# Patient Record
Sex: Female | Born: 1960 | Race: White | Hispanic: No | Marital: Married | Smoking: Former smoker
Health system: Southern US, Community
[De-identification: ages and names within clinical notes are randomized; demographics above are authoritative.]

## PROBLEM LIST (undated history)

## (undated) DIAGNOSIS — E119 Type 2 diabetes mellitus without complications: Secondary | ICD-10-CM

## (undated) DIAGNOSIS — R4702 Dysphasia: Secondary | ICD-10-CM

## (undated) DIAGNOSIS — G43909 Migraine, unspecified, not intractable, without status migrainosus: Secondary | ICD-10-CM

## (undated) DIAGNOSIS — Z8614 Personal history of Methicillin resistant Staphylococcus aureus infection: Secondary | ICD-10-CM

## (undated) DIAGNOSIS — F319 Bipolar disorder, unspecified: Secondary | ICD-10-CM

## (undated) DIAGNOSIS — E876 Hypokalemia: Secondary | ICD-10-CM

## (undated) DIAGNOSIS — IMO0001 Reserved for inherently not codable concepts without codable children: Secondary | ICD-10-CM

## (undated) DIAGNOSIS — F329 Major depressive disorder, single episode, unspecified: Secondary | ICD-10-CM

## (undated) DIAGNOSIS — E785 Hyperlipidemia, unspecified: Secondary | ICD-10-CM

## (undated) DIAGNOSIS — I214 Non-ST elevation (NSTEMI) myocardial infarction: Secondary | ICD-10-CM

## (undated) HISTORY — DX: Non-ST elevation (NSTEMI) myocardial infarction: I21.4

## (undated) HISTORY — DX: Reserved for inherently not codable concepts without codable children: IMO0001

## (undated) HISTORY — DX: Dysphasia: R47.02

## (undated) HISTORY — DX: Personal history of Methicillin resistant Staphylococcus aureus infection: Z86.14

## (undated) HISTORY — DX: Hyperlipidemia, unspecified: E78.5

## (undated) HISTORY — DX: Hypokalemia: E87.6

## (undated) HISTORY — DX: Migraine, unspecified, not intractable, without status migrainosus: G43.909

## (undated) HISTORY — DX: Type 2 diabetes mellitus without complications: E11.9

---

## 1994-06-25 HISTORY — PX: KNEE ARTHROSCOPY: SUR90

## 2000-06-19 ENCOUNTER — Encounter: Payer: Self-pay | Admitting: Dermatology

## 2000-06-19 ENCOUNTER — Ambulatory Visit (HOSPITAL_COMMUNITY): Admission: RE | Admit: 2000-06-19 | Discharge: 2000-06-19 | Payer: Self-pay | Admitting: Dermatology

## 2000-06-19 ENCOUNTER — Encounter (INDEPENDENT_AMBULATORY_CARE_PROVIDER_SITE_OTHER): Payer: Self-pay | Admitting: Specialist

## 2001-05-29 ENCOUNTER — Ambulatory Visit (HOSPITAL_COMMUNITY): Admission: RE | Admit: 2001-05-29 | Discharge: 2001-05-29 | Payer: Self-pay | Admitting: Family Medicine

## 2001-05-29 ENCOUNTER — Encounter: Payer: Self-pay | Admitting: Family Medicine

## 2001-07-03 ENCOUNTER — Other Ambulatory Visit: Admission: RE | Admit: 2001-07-03 | Discharge: 2001-07-03 | Payer: Self-pay | Admitting: Family Medicine

## 2001-11-14 ENCOUNTER — Encounter: Admission: RE | Admit: 2001-11-14 | Discharge: 2001-11-14 | Payer: Self-pay | Admitting: Psychiatry

## 2001-11-18 ENCOUNTER — Encounter: Admission: RE | Admit: 2001-11-18 | Discharge: 2001-11-18 | Payer: Self-pay | Admitting: Psychiatry

## 2001-12-01 ENCOUNTER — Encounter: Admission: RE | Admit: 2001-12-01 | Discharge: 2001-12-01 | Payer: Self-pay | Admitting: Psychiatry

## 2001-12-18 ENCOUNTER — Encounter: Admission: RE | Admit: 2001-12-18 | Discharge: 2001-12-18 | Payer: Self-pay | Admitting: Psychiatry

## 2001-12-22 ENCOUNTER — Encounter: Admission: RE | Admit: 2001-12-22 | Discharge: 2001-12-22 | Payer: Self-pay | Admitting: Psychiatry

## 2002-01-06 ENCOUNTER — Encounter: Admission: RE | Admit: 2002-01-06 | Discharge: 2002-01-06 | Payer: Self-pay | Admitting: Psychiatry

## 2002-01-15 ENCOUNTER — Encounter: Admission: RE | Admit: 2002-01-15 | Discharge: 2002-01-15 | Payer: Self-pay | Admitting: Psychiatry

## 2002-02-03 ENCOUNTER — Encounter: Admission: RE | Admit: 2002-02-03 | Discharge: 2002-02-03 | Payer: Self-pay | Admitting: Psychiatry

## 2002-02-24 ENCOUNTER — Encounter: Admission: RE | Admit: 2002-02-24 | Discharge: 2002-02-24 | Payer: Self-pay | Admitting: Psychiatry

## 2002-04-06 ENCOUNTER — Encounter: Admission: RE | Admit: 2002-04-06 | Discharge: 2002-04-06 | Payer: Self-pay | Admitting: Psychiatry

## 2002-04-14 ENCOUNTER — Encounter: Admission: RE | Admit: 2002-04-14 | Discharge: 2002-04-14 | Payer: Self-pay | Admitting: Psychiatry

## 2002-05-18 ENCOUNTER — Encounter: Admission: RE | Admit: 2002-05-18 | Discharge: 2002-05-18 | Payer: Self-pay | Admitting: Psychiatry

## 2002-06-22 ENCOUNTER — Encounter: Admission: RE | Admit: 2002-06-22 | Discharge: 2002-06-22 | Payer: Self-pay | Admitting: Psychiatry

## 2002-07-27 ENCOUNTER — Encounter: Admission: RE | Admit: 2002-07-27 | Discharge: 2002-07-27 | Payer: Self-pay | Admitting: Professional Counselor

## 2002-08-10 ENCOUNTER — Encounter: Admission: RE | Admit: 2002-08-10 | Discharge: 2002-08-10 | Payer: Self-pay | Admitting: *Deleted

## 2002-09-03 ENCOUNTER — Encounter: Payer: Self-pay | Admitting: Family Medicine

## 2002-09-03 ENCOUNTER — Ambulatory Visit (HOSPITAL_COMMUNITY): Admission: RE | Admit: 2002-09-03 | Discharge: 2002-09-03 | Payer: Self-pay | Admitting: Family Medicine

## 2002-10-05 ENCOUNTER — Encounter: Admission: RE | Admit: 2002-10-05 | Discharge: 2002-10-05 | Payer: Self-pay | Admitting: *Deleted

## 2002-10-07 ENCOUNTER — Encounter: Admission: RE | Admit: 2002-10-07 | Discharge: 2002-10-07 | Payer: Self-pay | Admitting: *Deleted

## 2003-01-27 ENCOUNTER — Encounter: Admission: RE | Admit: 2003-01-27 | Discharge: 2003-01-27 | Payer: Self-pay | Admitting: *Deleted

## 2003-02-11 ENCOUNTER — Other Ambulatory Visit: Admission: RE | Admit: 2003-02-11 | Discharge: 2003-02-11 | Payer: Self-pay | Admitting: Family Medicine

## 2003-02-26 ENCOUNTER — Encounter: Admission: RE | Admit: 2003-02-26 | Discharge: 2003-02-26 | Payer: Self-pay | Admitting: Family Medicine

## 2003-02-26 ENCOUNTER — Encounter: Payer: Self-pay | Admitting: Family Medicine

## 2003-04-13 ENCOUNTER — Emergency Department (HOSPITAL_COMMUNITY): Admission: EM | Admit: 2003-04-13 | Discharge: 2003-04-13 | Payer: Self-pay | Admitting: Emergency Medicine

## 2003-12-31 ENCOUNTER — Encounter: Admission: RE | Admit: 2003-12-31 | Discharge: 2003-12-31 | Payer: Self-pay | Admitting: Psychiatry

## 2004-03-03 ENCOUNTER — Ambulatory Visit (HOSPITAL_COMMUNITY): Payer: Self-pay | Admitting: Psychiatry

## 2004-04-03 ENCOUNTER — Ambulatory Visit (HOSPITAL_COMMUNITY): Admission: RE | Admit: 2004-04-03 | Discharge: 2004-04-03 | Payer: Self-pay | Admitting: Gastroenterology

## 2004-06-25 HISTORY — PX: ESOPHAGEAL DILATION: SHX303

## 2004-07-17 ENCOUNTER — Ambulatory Visit (HOSPITAL_COMMUNITY): Payer: Self-pay | Admitting: Psychiatry

## 2004-09-07 ENCOUNTER — Other Ambulatory Visit: Admission: RE | Admit: 2004-09-07 | Discharge: 2004-09-07 | Payer: Self-pay | Admitting: Family Medicine

## 2004-09-19 ENCOUNTER — Encounter: Admission: RE | Admit: 2004-09-19 | Discharge: 2004-09-19 | Payer: Self-pay | Admitting: Family Medicine

## 2004-10-09 ENCOUNTER — Ambulatory Visit (HOSPITAL_COMMUNITY): Payer: Self-pay | Admitting: Psychiatry

## 2005-01-08 ENCOUNTER — Ambulatory Visit (HOSPITAL_COMMUNITY): Payer: Self-pay | Admitting: Psychiatry

## 2005-04-11 ENCOUNTER — Ambulatory Visit (HOSPITAL_COMMUNITY): Payer: Self-pay | Admitting: Psychiatry

## 2005-05-01 ENCOUNTER — Encounter: Admission: RE | Admit: 2005-05-01 | Discharge: 2005-05-01 | Payer: Self-pay | Admitting: Family Medicine

## 2005-07-09 ENCOUNTER — Ambulatory Visit (HOSPITAL_COMMUNITY): Payer: Self-pay | Admitting: Psychiatry

## 2006-09-18 ENCOUNTER — Ambulatory Visit (HOSPITAL_COMMUNITY): Payer: Self-pay | Admitting: Psychiatry

## 2006-12-30 ENCOUNTER — Emergency Department (HOSPITAL_COMMUNITY): Admission: EM | Admit: 2006-12-30 | Discharge: 2006-12-30 | Payer: Self-pay | Admitting: Emergency Medicine

## 2007-01-22 ENCOUNTER — Ambulatory Visit (HOSPITAL_COMMUNITY): Admission: RE | Admit: 2007-01-22 | Discharge: 2007-01-22 | Payer: Self-pay | Admitting: Obstetrics and Gynecology

## 2007-01-22 ENCOUNTER — Encounter (INDEPENDENT_AMBULATORY_CARE_PROVIDER_SITE_OTHER): Payer: Self-pay | Admitting: Obstetrics and Gynecology

## 2007-02-05 ENCOUNTER — Inpatient Hospital Stay (HOSPITAL_COMMUNITY): Admission: AD | Admit: 2007-02-05 | Discharge: 2007-02-05 | Payer: Self-pay | Admitting: Obstetrics and Gynecology

## 2007-04-01 ENCOUNTER — Ambulatory Visit (HOSPITAL_COMMUNITY): Payer: Self-pay | Admitting: Psychiatry

## 2007-04-21 ENCOUNTER — Encounter: Admission: RE | Admit: 2007-04-21 | Discharge: 2007-04-21 | Payer: Self-pay | Admitting: Neurosurgery

## 2007-06-26 HISTORY — PX: ENDOMETRIAL ABLATION: SHX621

## 2008-05-26 ENCOUNTER — Ambulatory Visit (HOSPITAL_COMMUNITY): Payer: Self-pay | Admitting: Psychiatry

## 2008-12-08 ENCOUNTER — Ambulatory Visit (HOSPITAL_COMMUNITY): Payer: Self-pay | Admitting: Psychiatry

## 2010-01-01 ENCOUNTER — Ambulatory Visit: Payer: Self-pay | Admitting: Cardiology

## 2010-01-01 ENCOUNTER — Encounter: Payer: Self-pay | Admitting: Emergency Medicine

## 2010-01-02 ENCOUNTER — Encounter (INDEPENDENT_AMBULATORY_CARE_PROVIDER_SITE_OTHER): Payer: Self-pay | Admitting: *Deleted

## 2010-01-02 ENCOUNTER — Encounter (INDEPENDENT_AMBULATORY_CARE_PROVIDER_SITE_OTHER): Payer: Self-pay | Admitting: Internal Medicine

## 2010-01-02 ENCOUNTER — Ambulatory Visit: Payer: Self-pay | Admitting: Gastroenterology

## 2010-01-03 ENCOUNTER — Ambulatory Visit: Payer: Self-pay | Admitting: Cardiology

## 2010-01-03 ENCOUNTER — Inpatient Hospital Stay (HOSPITAL_COMMUNITY): Admission: EM | Admit: 2010-01-03 | Discharge: 2010-01-04 | Payer: Self-pay | Admitting: Cardiovascular Disease

## 2010-01-03 HISTORY — PX: CARDIAC CATHETERIZATION: SHX172

## 2010-01-18 ENCOUNTER — Ambulatory Visit: Payer: Self-pay | Admitting: Cardiology

## 2010-01-18 ENCOUNTER — Encounter: Payer: Self-pay | Admitting: Adult Health

## 2010-01-18 DIAGNOSIS — E1165 Type 2 diabetes mellitus with hyperglycemia: Secondary | ICD-10-CM

## 2010-01-18 DIAGNOSIS — I2510 Atherosclerotic heart disease of native coronary artery without angina pectoris: Secondary | ICD-10-CM

## 2010-01-18 DIAGNOSIS — R030 Elevated blood-pressure reading, without diagnosis of hypertension: Secondary | ICD-10-CM

## 2010-01-18 DIAGNOSIS — E1129 Type 2 diabetes mellitus with other diabetic kidney complication: Secondary | ICD-10-CM

## 2010-01-18 DIAGNOSIS — K029 Dental caries, unspecified: Secondary | ICD-10-CM | POA: Insufficient documentation

## 2010-02-09 ENCOUNTER — Encounter: Payer: Self-pay | Admitting: Gastroenterology

## 2010-03-08 ENCOUNTER — Ambulatory Visit (HOSPITAL_COMMUNITY): Payer: Self-pay | Admitting: Psychiatry

## 2010-04-28 ENCOUNTER — Ambulatory Visit (HOSPITAL_COMMUNITY): Payer: Self-pay | Admitting: Psychiatry

## 2010-05-08 ENCOUNTER — Encounter (INDEPENDENT_AMBULATORY_CARE_PROVIDER_SITE_OTHER): Payer: Self-pay | Admitting: *Deleted

## 2010-05-09 ENCOUNTER — Ambulatory Visit: Payer: Self-pay | Admitting: Cardiology

## 2010-05-16 ENCOUNTER — Encounter: Payer: Self-pay | Admitting: Adult Health

## 2010-05-16 ENCOUNTER — Ambulatory Visit: Payer: Self-pay | Admitting: Cardiology

## 2010-05-27 ENCOUNTER — Encounter: Payer: Self-pay | Admitting: Adult Health

## 2010-05-27 LAB — CONVERTED CEMR LAB
BUN: 12 mg/dL (ref 6–23)
CO2: 26 meq/L (ref 19–32)
Chloride: 105 meq/L (ref 96–112)
Potassium: 3.9 meq/L (ref 3.5–5.3)
Sodium: 142 meq/L (ref 135–145)

## 2010-07-25 NOTE — Letter (Signed)
Summary: [facility] [employer] at [employer]  [address] [place] [address]   Phone: [phone]  Fax: [phone]     May 16, 2010 MRN: [id number]   DELVA DERDEN [address] [place], [place]  [address]      YOUR LAB WORK IS DUE  May 30, 2010 _________________________________________  Please go to [facility], located across the street from [facility] on the second floor.  Hours are Monday - Friday 7am until 7:30pm         Saturday 8am until 12noon    __  DO NOT EAT OR DRINK AFTER MIDNIGHT EVENING PRIOR TO LABWORK  _X_ YOUR LABWORK IS NOT FASTING --YOU MAY EAT PRIOR TO LABWORK

## 2010-07-25 NOTE — Miscellaneous (Signed)
Summary: LABS TSH,A1C,01/02/2010  Clinical Lists Changes  Observations: Added new observation of TSH: 0.848 microintl units/mL (01/02/2010 15:09) Added new observation of HGBA1C: 11.7 % (01/02/2010 15:09)

## 2010-07-25 NOTE — Assessment & Plan Note (Signed)
Summary: F3M   Visit Type:  Follow-up Primary Provider:  [facility]   History of Present Illness: Allison [name] is a 50 y/o CF who we are seeing on follow-up after admission to [facility] and subsequent transfer to [facility] in the setting of NSTEMI.  She had subsequent cardiac catherization with PCI to a long lesion in the mid RCA using overlapping Promus DES with improvement of narrowing from 100%-0%.   Other history includes NIDDM, GERD, Ongoing tobacco abuse. She presents today without cardiac complaints but is suffering from NV and Diarrhea for 2 days. She denies chest pain, DOE, palpatations, or dizziness.    Current Medications (verified): 1)  Aspir-Trin 325 Mg Tbec (Aspirin) .... Take 1 Tab Daily 2)  Plavix 75 Mg Tabs (Clopidogrel Bisulfate) .... Take 1 Tab Daily 3)  Nitrostat 0.4 Mg Subl (Nitroglycerin) .... Use As Directed For Chest Pain Not To Exceed 3 Tabs 4)  Amlodipine Besylate 10 Mg Tabs (Amlodipine Besylate) .... Take 1 Tablet By Mouth Once Daily 5)  Lisinopril-Hydrochlorothiazide 20-12.5 Mg Tabs (Lisinopril-Hydrochlorothiazide) .... Take 1 Tab Daily 6)  Bupropion Hcl 150 Mg Xr24h-Tab (Bupropion Hcl) .... Take 1 Tab Daily 7)  Cinnamon 500 Mg Caps (Cinnamon) .... Take 1 Tab Two Times A Day 8)  Fish Oil 1000 Mg Caps (Omega-3 Fatty Acids) .... Take 1 Cap Daily 9)  Januvia 100 Mg Tabs (Sitagliptin Phosphate) .... Take 1 Tab Daily 10)  Nexium 40 Mg Pack (Esomeprazole Magnesium) .... Take 1 Tab Daily 11)  Paxil Cr 37.5 Mg Xr24h-Tab (Paroxetine Hcl) .... Take 1 Tab Daily 12)  Red Yeast Rice 600 Mg Tabs (Red Yeast Rice Extract) .... Take 1 Tab Daily 13)  Trilipix 135 Mg Cpdr (Choline Fenofibrate) .... Take 1 Tab Daily 14)  Tylenol Extra Strength 500 Mg Tabs (Acetaminophen) .... Use As Needed For Pain 15)  Lantus 100 Unit/ml Soln (Insulin Glargine) .... 55 Units Daily 16)  Norco 5-325 Mg Tabs (Hydrocodone-Acetaminophen) .... Take As Needed 17)  Apidra 100 Unit/ml Soln  (Insulin Glulisine) .... Sliding Scale  Allergies (verified): 1)  ! Sulfa 2)  ! Penicillin 3)  ! * Rosuvastatn 4)  ! * Latex 5)  ! Asa  Comments:  Nurse/Medical Assistant: patient reviewed med list from previous ov and stated the only changes is  apidra ss and paxil 37.5 daily took off of glipizide   Past History:  Past medical, surgical, family and social histories (including risk factors) reviewed, and no changes noted (except as noted below).  Past Medical History: Reviewed history from 01/16/2010 and no changes required. non-st elevated myocardial infarction dysphasia hypotension hypokalemia non-insulin dependant diabetes hyperlipidemia migrain headaches hemorroids history of MRSA  Past Surgical History: Reviewed history from 01/16/2010 and no changes required. arthroscopic right knee surgery in 1996 laser endometrial ablation 2009 esophageal dilation 2006 cardiac cath 01/03/2010  Family History: Reviewed history and no changes required.  Social History: Reviewed history and no changes required.  Review of Systems       Nausea, vomiting and diarrhea  All other systems have been reviewed and are negative unless stated above.   Vital Signs:  Patient profile:   50 year old female Weight:      205 pounds O2 Sat:      99 % on Room air Pulse rate:   86 / minute BP sitting:   176 / 81  (right arm)  Vitals Entered By: Dreama Saa, CNA (May 09, 2010 11:12 AM)  O2 Flow:  Room air  Physical Exam  General:  Well developed, well nourished, in no acute distress.normal appearance.  normal appearance.   Mouth:  poor dentition.  poor dentition.   Lungs:  Clear bilaterally to auscultation and percussion. Heart:  Non-displaced PMI, chest non-tender; regular rate and rhythm, S1, S2 without murmurs, rubs or gallops. Carotid upstroke normal, no bruit. Normal abdominal aortic size, no bruits. Femorals normal pulses, no bruits. Pedals normal pulses. No edema, no  varicosities. Abdomen:  RUQ tenderness and LUQ tenderness.  Normal bowel soundsRUQ tenderness and LUQ tenderness.   Msk:  Back normal, normal gait. Muscle strength and tone normal. Pulses:  pulses normal in all 4 extremities Extremities:  No clubbing or cyanosis. Neurologic:  Alert and oriented x 3. Skin:  macular rash:Marland Kitchen   Psych:  Normal affect.   Impression & Recommendations:  Problem # 1:  HYPERTENSION, WHITE COAT (ICD-796.2) Blood pressure is even more elevated on this visit than on prior visits.  Will increase norvasc to 10mg  daily.  She will follow up in one week for BP check.  She is advised to be on a low salt diet.  Problem # 2:  CORONARY ATHEROSCLEROSIS, NATIVE VESSEL (ICD-414.01) Appears stable from a CV standpoint.  No chest pain or shortness of breath.  Continue current medications. Her updated medication list for this problem includes:    Aspir-trin 325 Mg Tbec (Aspirin) .Marland Kitchen... Take 1 tab daily    Plavix 75 Mg Tabs (Clopidogrel bisulfate) .Marland Kitchen... Take 1 tab daily    Nitrostat 0.4 Mg Subl (Nitroglycerin) ..... Use as directed for chest pain not to exceed 3 tabs    Amlodipine Besylate 10 Mg Tabs (Amlodipine besylate) .Marland Kitchen... Take 1 tablet by mouth once daily    Lisinopril-hydrochlorothiazide 20-12.5 Mg Tabs (Lisinopril-hydrochlorothiazide) .Marland Kitchen... Take 1 tab daily  Patient Instructions: 1)  Your physician recommends that you schedule a follow-up appointment in: 6 months 2)  Your physician has recommended you make the following change in your medication: Increase Amlodipine to 10mg  by mouth daily. Prescriptions: AMLODIPINE BESYLATE 10 MG TABS (AMLODIPINE BESYLATE) take 1 tablet by mouth once daily  #30 x 6   Entered by:   Larita Fife Via LPN   Authorized by:   Joni Reining, NP   Signed by:   Larita Fife Via LPN on 56/21/3086   Method used:   Electronically to        [facility] [phone]* (retail)       [address]       [place], [place]   [address]       Ph: [phone] or [phone]       Fax: [phone]   RxID:   [id number]

## 2010-07-25 NOTE — Letter (Signed)
Summary: CONSULTATION  CONSULTATION   Imported By: Rexene Alberts 02/09/2010 11:32:25  _____________________________________________________________________  External Attachment:    Type:   Image     Comment:   External Document

## 2010-07-25 NOTE — Assessment & Plan Note (Signed)
Summary: post hosp [facility] per Matt/tg   Visit Type:  Follow-up Primary Provider:  [facility]  CC:  no cardiology complaints.  History of Present Illness: Mrs. Allison [name] is a 50 y/o CF who we are seeing on follow-up after admission to [facility] and subsequent transfer to [facility] in the setting of NSTEMI.  She had subsequent cardiac catherization with PCI to a long lesion in the mid RCA using overlapping Promus DES with improvement of narrowing from 100%-0%.   Other history includes NIDDM, GERD, Ongoing tobacco abuse.  She is here for post discharge evaluation.  Since discharge, she has been seen by Dr.David Mandalinich DDS and is planned to have tooth extraction X2.  She has a right mandibular idontogenic infection and has been placed on Biaxin for treatment.  A note from Dr. Deeann Dowse accompanies the patient with his to request advisement of any further prophylatic treatment with newly placed stents.  He is aware that she is on Plavix.  She has had one episode of chest pain requiring one dose of NTG.  This occured with vacuming and heavy cleaning of her house.  No recurrence of pain.  She walks 15 minutes each day in the morning and evening.  Cannot afford cardiac rehab because of high deductible from state insurance.  Current Medications (verified): 1)  Aspir-Trin 325 Mg Tbec (Aspirin) .... Take 1 Tab Daily 2)  Plavix 75 Mg Tabs (Clopidogrel Bisulfate) .... Take 1 Tab Daily 3)  Nicotine 21 Mg/24hr Pt24 (Nicotine) 4)  Nitrostat 0.4 Mg Subl (Nitroglycerin) .... Use As Directed For Chest Pain Not To Exceed 3 Tabs 5)  Amlodipine Besylate 5 Mg Tabs (Amlodipine Besylate) .... Take 1tab Daily 6)  Lisinopril-Hydrochlorothiazide 20-12.5 Mg Tabs (Lisinopril-Hydrochlorothiazide) .... Take 1 Tab Daily 7)  Bupropion Hcl 150 Mg Xr24h-Tab (Bupropion Hcl) .... Take 1 Tab Daily 8)  Cinnamon 500 Mg Caps (Cinnamon) .... Take 1 Tab Two Times A Day 9)  Fish Oil 1000 Mg Caps (Omega-3 Fatty Acids)  .... Take 1 Cap Daily 10)  Glimepiride 4 Mg Tabs (Glimepiride) .... Take 1 Tab Daily 11)  Januvia 100 Mg Tabs (Sitagliptin Phosphate) .... Take 1 Tab Daily 12)  Nexium 40 Mg Pack (Esomeprazole Magnesium) .... Take 1 Tab Daily 13)  Paroxetine Hcl 25 Mg Xr24h-Tab (Paroxetine Hcl) .... Take 1 Tab Daily 14)  Red Yeast Rice 600 Mg Tabs (Red Yeast Rice Extract) .... Take 1 Tab Daily 15)  Trilipix 135 Mg Cpdr (Choline Fenofibrate) .... Take 1 Tab Daily 16)  Tylenol Extra Strength 500 Mg Tabs (Acetaminophen) .... Use As Needed For Pain 17)  Lantus 100 Unit/ml Soln (Insulin Glargine) .... 30 Units Daily 18)  Norco 5-325 Mg Tabs (Hydrocodone-Acetaminophen) .... Take As Needed 19)  Clindamycin Hcl 150 Mg Caps (Clindamycin Hcl) .... Take 1 Tab Three Times A Day  Allergies (verified): 1)  ! Sulfa 2)  ! Penicillin 3)  ! * Rosuvastatn 4)  ! * Latex 5)  ! Asa  Past History:  Past medical, surgical, family and social histories (including risk factors) reviewed, and no changes noted (except as noted below).  Past Medical History: Reviewed history from 01/16/2010 and no changes required. non-st elevated myocardial infarction dysphasia hypotension hypokalemia non-insulin dependant diabetes hyperlipidemia migrain headaches hemorroids history of MRSA  Past Surgical History: Reviewed history from 01/16/2010 and no changes required. arthroscopic right knee surgery in 1996 laser endometrial ablation 2009 esophageal dilation 2006 cardiac cath 01/03/2010  Family History: Reviewed history and no changes required.  Social  History: Reviewed history and no changes required.  Review of Systems       All other systems have been reviewed and are negative unless stated above.   Vital Signs:  Patient profile:   50 year old female Height:      63 inches Weight:      191 pounds BMI:     33.96 Pulse rate:   86 / minute BP sitting:   160 / 87  (right arm)  Vitals Entered By: Dreama Saa, CNA  (January 18, 2010 12:04 PM)  Physical Exam  General:  Well developed, well nourished, in no acute distress. Neck:  Neck supple, no JVD. No masses, thyromegaly or abnormal cervical nodes. Lungs:  Clear bilaterally to auscultation and percussion. Heart:  Non-displaced PMI, chest non-tender; regular rate and rhythm, S1, S2 without murmurs, rubs or gallops. Carotid upstroke normal, no bruit. Normal abdominal aortic size, no bruits. Femorals normal pulses, no bruits. Pedals normal pulses. No edema, no varicosities. Abdomen:  Bowel sounds positive; abdomen soft and non-tender without masses, organomegaly, or hernias noted. No hepatosplenomegaly. Msk:  Back normal, normal gait. Muscle strength and tone normal. Extremities:  Right wrist is well healed from cath insertion site. Neurologic:  Alert and oriented x 3. Skin:  eczematous rash: and maculopapular rash hands, knees and feet.  Psych:  hyperactive.     EKG  Procedure date:  01/18/2010  Findings:      Normal sinus rhythm with rate of:  76bpm  Impression & Recommendations:  Problem # 1:  CORONARY ATHEROSCLEROSIS, NATIVE VESSEL (ICD-414.01) She is doing well post PCI with only one incidence of chest pain occuring after vacuming and heavy house cleaning less than 3 days after discharge.  She has multiple questions concerning activities. 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  She is to continue the current medications, DAPT with no change in the doseages., Her updated medication list for this problem includes:    Aspir-trin 325 Mg Tbec (Aspirin) .Marland Kitchen... Take 1 tab daily    Plavix 75 Mg Tabs (Clopidogrel bisulfate) .Marland Kitchen... Take 1 tab daily    Nitrostat 0.4 Mg Subl (Nitroglycerin) ..... Use as directed for chest pain not to exceed 3 tabs    Amlodipine Besylate 5 Mg Tabs (Amlodipine besylate) .Marland Kitchen... Take 1tab daily     Lisinopril-hydrochlorothiazide 20-12.5 Mg Tabs (Lisinopril-hydrochlorothiazide) .Marland Kitchen... Take 1 tab daily  Problem # 2:  DENTAL CARIES (ICD-521.00) I have spoken to Dr.David Mandalinich by phone and advised him that patient can continue on prophylactic abx treatment for mandibular infection prior to tooth extraction.  He is very aware of pt being on Plavix and need to continue this post stent placement for a minimum of 6months, but perferably one year.  He advises me that he can do the extraction on Plavix without extensive bleed, with new "plugging" procedures post extraction.   Problem # 3:  HYPERTENSION, WHITE COAT (ICD-796.2) Curently the patient is hypertensive in the office.  Review of hospital records reveals evidence of hypotension.  She appears anxious today in the office, mildy hyperactive.  Will monitor the BP. If it continues to be elevated we wil make adjustments.  Patient Instructions: 1)  Your physician recommends that you schedule a follow-up appointment in: 3 months 2)  Your physician recommends that you continue on your current medications as directed. Please refer to the Current Medication list  given to you today.  Appended Document: post hosp [facility] per Matt/tg Office visit note faxed to Dr. Deeann Dowse @ [phone].

## 2010-07-25 NOTE — Assessment & Plan Note (Signed)
**Note De-Identified Allison [name] Obfuscation** Summary: NURSE VISIT BP CHECK  Nurse Visit   Vital Signs:  Patient profile:   50 year old female Weight:      211 pounds O2 Sat:      96 % on Room air Pulse rate:   78 / minute BP sitting:   144 / 89  (left arm)  Vitals Entered By: Larita Fife Margarito Dehaas LPN (May 16, 2010 8:30 AM)  O2 Flow:  Room air  Primary Provider:  [facility]   History of Present Illness: S: Pt. arrives in office for 1 week BP check/nurse visit. B: On last OV with Joni Reining, NP on 05-09-10 and was advised to increase Norvasc to 10mg  once daily and to have BP check in 1 week.  A: Pt. c/o headaches, otherwise she has no complaints at this time. BP=136/83. R: Pt. advised that we will contact her with Claudine Mouton, NP's recommendations if any.   Allergies: 1)  ! Sulfa 2)  ! Penicillin 3)  ! * Rosuvastatn 4)  ! * Latex 5)  ! Asa  Orders Added: 1)  T-Basic Metabolic Panel [id number] Prescriptions: LISINOPRIL-HYDROCHLOROTHIAZIDE 20-25 MG TABS (LISINOPRIL-HYDROCHLOROTHIAZIDE) take 1 tablet by mouth once daily  #30 x 3   Entered by:   Larita Fife Patricio Popwell LPN   Authorized by:   Joni Reining, NP   Signed by:   Larita Fife Airianna Kreischer LPN on 09/81/1914   Method used:   Electronically to        [facility] [phone]* (retail)       [address]       [place], [place]  [address]       Ph: [phone] or [phone]       Fax: [phone]   RxID:   [id number]  Blood pressure much better. Continue same meds, but increase HCTZ to 25mg  daily.  BMET in 2 weeks.  LMOM.   Larita Fife Kewanna Kasprzak LPN  May 16, 2010 11:27 AM    Per Joni Reining, NP pt. advised and states she understands instructions given. RX faxed to [facility] in [place] and lab order mailed to pt.   Larita Fife Elizabethanne Lusher LPN  May 16, 2010 12:13 PM

## 2010-08-01 ENCOUNTER — Other Ambulatory Visit: Payer: Self-pay | Admitting: Family Medicine

## 2010-08-01 DIAGNOSIS — Z1231 Encounter for screening mammogram for malignant neoplasm of breast: Secondary | ICD-10-CM

## 2010-08-24 ENCOUNTER — Ambulatory Visit
Admission: RE | Admit: 2010-08-24 | Discharge: 2010-08-24 | Disposition: A | Discharge status: Home / Self Care | Payer: BC Managed Care – PPO | Source: Ambulatory Visit | Attending: Family Medicine | Admitting: Family Medicine

## 2010-08-24 DIAGNOSIS — Z1231 Encounter for screening mammogram for malignant neoplasm of breast: Secondary | ICD-10-CM

## 2010-08-25 ENCOUNTER — Encounter (HOSPITAL_COMMUNITY): Payer: BC Managed Care – PPO | Admitting: Physician Assistant

## 2010-08-25 DIAGNOSIS — F3189 Other bipolar disorder: Secondary | ICD-10-CM

## 2010-09-10 LAB — URINALYSIS, ROUTINE W REFLEX MICROSCOPIC
Bilirubin Urine: NEGATIVE
Glucose, UA: 500 mg/dL — AB
Hgb urine dipstick: NEGATIVE
Protein, ur: NEGATIVE mg/dL

## 2010-09-10 LAB — CBC
HCT: 38.6 % (ref 36.0–46.0)
HCT: 41.6 % (ref 36.0–46.0)
Hemoglobin: 13.1 g/dL (ref 12.0–15.0)
Hemoglobin: 13 g/dL (ref 12.0–15.0)
MCH: 31.2 pg (ref 26.0–34.0)
MCH: 31 pg (ref 26.0–34.0)
MCH: 31.2 pg (ref 26.0–34.0)
MCHC: 34.1 g/dL (ref 30.0–36.0)
MCHC: 34.2 g/dL (ref 30.0–36.0)
MCV: 92.1 fL (ref 78.0–100.0)
MCV: 90.7 fL (ref 78.0–100.0)
MCV: 91.5 fL (ref 78.0–100.0)
MCV: 91.6 fL (ref 78.0–100.0)
Platelets: 272 10*3/uL (ref 150–400)
Platelets: 333 10*3/uL (ref 150–400)
Platelets: 375 10*3/uL (ref 150–400)
RBC: 4.2 MIL/uL (ref 3.87–5.11)
RBC: 4.17 MIL/uL (ref 3.87–5.11)
RBC: 4.55 MIL/uL (ref 3.87–5.11)
RBC: 4.9 MIL/uL (ref 3.87–5.11)
WBC: 10.8 10*3/uL — ABNORMAL HIGH (ref 4.0–10.5)
WBC: 15.5 10*3/uL — ABNORMAL HIGH (ref 4.0–10.5)

## 2010-09-10 LAB — GLUCOSE, CAPILLARY
Glucose-Capillary: 161 mg/dL — ABNORMAL HIGH (ref 70–99)
Glucose-Capillary: 236 mg/dL — ABNORMAL HIGH (ref 70–99)
Glucose-Capillary: 242 mg/dL — ABNORMAL HIGH (ref 70–99)
Glucose-Capillary: 224 mg/dL — ABNORMAL HIGH (ref 70–99)
Glucose-Capillary: 292 mg/dL — ABNORMAL HIGH (ref 70–99)

## 2010-09-10 LAB — BASIC METABOLIC PANEL
BUN: 9 mg/dL (ref 6–23)
CO2: 25 mEq/L (ref 19–32)
CO2: 24 mEq/L (ref 19–32)
Calcium: 8.6 mg/dL (ref 8.4–10.5)
Chloride: 108 mEq/L (ref 96–112)
Chloride: 101 mEq/L (ref 96–112)
GFR calc Af Amer: >60 mL/min (ref >60)
GFR calc Af Amer: >60 mL/min (ref >60)
GFR calc non Af Amer: >60 mL/min (ref >60)
Glucose, Bld: 231 mg/dL — ABNORMAL HIGH (ref 70–99)
Potassium: 3.9 mEq/L (ref 3.5–5.1)
Potassium: 3.2 mEq/L — ABNORMAL LOW (ref 3.5–5.1)
Sodium: 139 mEq/L (ref 135–145)
Sodium: 135 mEq/L (ref 135–145)

## 2010-09-10 LAB — POCT CARDIAC MARKERS
CKMB, poc: <1 ng/mL — ABNORMAL LOW (ref 1.0–8.0)
Myoglobin, poc: 59.8 ng/mL (ref 12–200)

## 2010-09-10 LAB — DIFFERENTIAL
Basophils Absolute: 0.1 10*3/uL (ref 0.0–0.1)
Basophils Relative: 1 % (ref 0–1)
Eosinophils Absolute: 0.1 10*3/uL (ref 0.0–0.7)
Eosinophils Relative: 1 % (ref 0–5)
Eosinophils Relative: 1 % (ref 0–5)
Lymphocytes Relative: 27 % (ref 12–46)
Lymphocytes Relative: 35 % (ref 12–46)
Lymphs Abs: 5.1 10*3/uL — ABNORMAL HIGH (ref 0.7–4.0)
Lymphs Abs: 5.4 10*3/uL — ABNORMAL HIGH (ref 0.7–4.0)
Monocytes Absolute: 0.4 10*3/uL (ref 0.1–1.0)
Monocytes Absolute: 0.8 10*3/uL (ref 0.1–1.0)
Monocytes Relative: 5 % (ref 3–12)
Neutro Abs: 7.3 10*3/uL (ref 1.7–7.7)
Neutrophils Relative %: 51 % (ref 43–77)

## 2010-09-10 LAB — URINE CULTURE: Culture: NO GROWTH

## 2010-09-10 LAB — PROTIME-INR
INR: 0.93 (ref 0.00–1.49)
Prothrombin Time: 12.7 seconds (ref 11.6–15.2)

## 2010-09-10 LAB — COMPREHENSIVE METABOLIC PANEL
Albumin: 3.8 g/dL (ref 3.5–5.2)
BUN: 11 mg/dL (ref 6–23)
Calcium: 9.3 mg/dL (ref 8.4–10.5)
Chloride: 103 mEq/L (ref 96–112)
Creatinine, Ser: 0.6 mg/dL (ref 0.4–1.2)
GFR calc non Af Amer: >60 mL/min (ref >60)
Total Bilirubin: 0.3 mg/dL (ref 0.3–1.2)

## 2010-09-10 LAB — HEPARIN LEVEL (UNFRACTIONATED): Heparin Unfractionated: 0.1 IU/mL — ABNORMAL LOW (ref 0.30–0.70)

## 2010-09-10 LAB — HEPARIN ANTI-XA
Heparin LMW: 0.1 IU/mL
Heparin LMW: <0.1 IU/mL

## 2010-09-10 LAB — LIPID PANEL
Cholesterol: 257 mg/dL — ABNORMAL HIGH (ref 0–200)
LDL Cholesterol: 153 mg/dL — ABNORMAL HIGH (ref 0–99)
Total CHOL/HDL Ratio: 7.1 RATIO

## 2010-09-10 LAB — CARDIAC PANEL(CRET KIN+CKTOT+MB+TROPI)
CK, MB: 34.4 ng/mL (ref 0.3–4.0)
CK, MB: 35 ng/mL (ref 0.3–4.0)
Relative Index: 5.6 — ABNORMAL HIGH (ref 0.0–2.5)
Total CK: 389 U/L — ABNORMAL HIGH (ref 7–177)
Troponin I: 3.91 ng/mL (ref 0.00–0.06)

## 2010-09-10 LAB — PLATELET INHIBITION P2Y12: Platelet Function Baseline: 300 [PRU] (ref 194–418)

## 2010-09-10 LAB — LIPASE, BLOOD: Lipase: 27 U/L (ref 11–59)

## 2010-09-10 LAB — HEMOGLOBIN A1C: Hgb A1c MFr Bld: 11.7 % — ABNORMAL HIGH (ref <5.7)

## 2010-09-28 ENCOUNTER — Other Ambulatory Visit: Payer: Self-pay | Admitting: Internal Medicine

## 2010-09-28 DIAGNOSIS — IMO0002 Reserved for concepts with insufficient information to code with codable children: Secondary | ICD-10-CM

## 2010-10-06 ENCOUNTER — Other Ambulatory Visit: Payer: BC Managed Care – PPO

## 2010-11-07 NOTE — Op Note (Signed)
Allison [name], Allison [name]              ACCOUNT NO.:  [id number]   MEDICAL RECORD NO.:  [id number]          PATIENT TYPE:  AMB   LOCATION:  SDC                           FACILITY:  [facility]   PHYSICIAN:  Miguel Aschoff, M.D.       DATE OF BIRTH:  Oct 25, 1960   DATE OF PROCEDURE:  01/22/2007  DATE OF DISCHARGE:                               OPERATIVE REPORT   PREOPERATIVE DIAGNOSIS:  Menorrhagia, pelvic pain, uterine fibroids.   POSTOPERATIVE DIAGNOSIS:  Menorrhagia, pelvic pain, uterine fibroids  with pelvic endometriosis.   PROCEDURES:  1. Diagnostic laparoscopy with laser ablation of endometriosis and      laser uterosacral nerve ablation.  2. Cervical dilatation, hysteroscopy, uterine curettage followed by      ThermaChoice endometrial ablation.   SURGEON:  Dr. Miguel Aschoff   ANESTHESIA:  General.   COMPLICATIONS:  None.   JUSTIFICATION:  The patient is a 50 year old white female with history  of very heavy vaginal bleeding. In addition she reports pelvic pain. On  examination she is noted to have diffusely enlarged uterus.  Ultrasound  reveals fibroids to be present.  Because her symptomatology, she  presents now to undergo laparoscopy and see if any etiology for pelvic  pain can be identified and then this to be followed by hysteroscopy, D&C  and endometrial ablation in effort to control heavy cycles. The risks  and benefits these procedures were discussed with the patient.   PROCEDURE:  The patient was taken to the operating room, placed supine  position.  General anesthesia was administered without difficulty.  She  is then placed in dorsal lithotomy position, prepped and draped in usual  sterile fashion.  Bladder was catheterized.  Examination under  anesthesia revealed normal external genitalia, normal Bartholin Skene's  glands, normal urethra.  The vaginal vault was without gross lesion.  The cervix was without gross lesions. The uterus was mid position,  globular, approximately 8  weeks' equivalent size.  The adnexa revealed  no masses.  This point the Hulka tenaculum was placed through the cervix  and held.  Attention was then directed to the umbilicus where a small  infraumbilical incision was made.  A Veress needle was inserted and the  abdomen was insufflated with 3 liters of CO2.  Following insufflation  laparoscope was introduced via the trocar. The laparoscope was in the  abdomen under direct visualization a 5 mm suprapubic port was  established.  Systematic inspection of pelvic organs revealed the uterus  to be globular, again diffusely enlarged consistent with myomas.  These  appeared to be intramural.  There were no subserosal myomas noted.  The  tubes were inspected.  They were normal along their course.  The round  ligaments were unremarkable.  No hernias were noted.  The anterior  bladder peritoneum was unremarkable. Visualization of the right ovary  showed to be completely within normal limits with a corpus luteum cyst  present.  The left ovary was inspected was within normal limits.  Again  the tubes bilaterally were normal.  Inspection the cul-de-sac revealed  some thickening of the uterosacral  ligaments in addition there was  characteristic implants of endometriosis noted in the cul-de-sac between  the two uterosacral ligaments. The remainder of the abdomen was  unremarkable liver surface was inspected appeared to be within normal  limits.  No other abnormalities were noted. At this point an effort to  help the patient's pelvic pain.  The YAG laser was used and with GRP6  and 15 watts of laser power, partial transection of the uterosacral  ligaments was carried out without difficulty.  Once this was done the  implants of endometriosis in the cul-de-sac were ablated without  difficulty.  At this point this portion of the procedure was completed.  The CO2 was allowed to escape. The trocar ports however were left in  place. At this point attention was  directed to the perineum.  A speculum  was replaced into the vaginal vault.  A Hulka tenaculum removed.  The  cervix was then grasped with a single-tooth tenaculum.  The cervix was  dilated using serial Pratt dilators until a #25 Pratt dilator could be  passed. The diagnostic hysteroscope was advanced through the endocervix.  No endocervical lesions were noted. On visualization of endometrial  cavity there were no significant submucous myomas noted in the  endometrial cavity.  No polyps were found. At this point, sharp vigorous  curettage of the endometrium was carried out.  This tissue was sent for  histologic study.  Once this was done, the ThermaChoice unit was  introduced and a treatment cycle at 87 degrees for 8 minutes was carried  out without difficulty.  On completion of this all instruments were  removed and then attention was directed back to the abdomen.  The  trocars were then removed.  Port sites were then closed using  subcuticular 4-0 Vicryl.  The port sites were then injected with 0.25%  Marcaine. The estimated blood loss from the procedures was approximately  20 mL.  The patient tolerated the procedure well and went to recovery in  satisfactory condition.   Plan is for the patient be discharged home.  She will continue her  medications taken prior to admission to the hospital.   DISCHARGE MEDICATIONS:  1. Darvocet N 100 one every 4 hours as needed for pain.  2. Doxycycline one twice a day times 3 days.   The patient is to return to the office in 4 weeks for follow-up  examination. She is to call for any problems such as fever, pain or  heavy bleeding.      Miguel Aschoff, M.D.  Electronically Signed     AR/MEDQ  D:  01/22/2007  T:  01/22/2007  Job:  [id number]

## 2011-02-08 ENCOUNTER — Ambulatory Visit (HOSPITAL_COMMUNITY)
Admission: RE | Admit: 2011-02-08 | Discharge: 2011-02-09 | Disposition: A | Discharge status: Home / Self Care | Payer: BC Managed Care – PPO | Source: Ambulatory Visit | Attending: Cardiovascular Disease | Admitting: Cardiovascular Disease

## 2011-02-08 DIAGNOSIS — I70219 Atherosclerosis of native arteries of extremities with intermittent claudication, unspecified extremity: Secondary | ICD-10-CM | POA: Insufficient documentation

## 2011-02-08 DIAGNOSIS — E119 Type 2 diabetes mellitus without complications: Secondary | ICD-10-CM | POA: Insufficient documentation

## 2011-02-08 DIAGNOSIS — I252 Old myocardial infarction: Secondary | ICD-10-CM | POA: Insufficient documentation

## 2011-02-08 DIAGNOSIS — M722 Plantar fascial fibromatosis: Secondary | ICD-10-CM | POA: Insufficient documentation

## 2011-02-08 DIAGNOSIS — I739 Peripheral vascular disease, unspecified: Secondary | ICD-10-CM | POA: Insufficient documentation

## 2011-02-08 DIAGNOSIS — E785 Hyperlipidemia, unspecified: Secondary | ICD-10-CM | POA: Insufficient documentation

## 2011-02-08 DIAGNOSIS — I708 Atherosclerosis of other arteries: Secondary | ICD-10-CM | POA: Insufficient documentation

## 2011-02-08 DIAGNOSIS — E663 Overweight: Secondary | ICD-10-CM | POA: Insufficient documentation

## 2011-02-08 DIAGNOSIS — I2510 Atherosclerotic heart disease of native coronary artery without angina pectoris: Secondary | ICD-10-CM | POA: Insufficient documentation

## 2011-02-08 DIAGNOSIS — I10 Essential (primary) hypertension: Secondary | ICD-10-CM | POA: Insufficient documentation

## 2011-02-08 DIAGNOSIS — Z87891 Personal history of nicotine dependence: Secondary | ICD-10-CM | POA: Insufficient documentation

## 2011-02-08 LAB — POCT ACTIVATED CLOTTING TIME: Activated Clotting Time: 221 seconds

## 2011-02-08 LAB — MRSA PCR SCREENING: MRSA by PCR: NEGATIVE

## 2011-02-08 LAB — GLUCOSE, CAPILLARY

## 2011-02-08 LAB — APTT: aPTT: 28 seconds (ref 24–37)

## 2011-02-08 LAB — PROTIME-INR
INR: 0.91 (ref 0.00–1.49)
Prothrombin Time: 12.4 seconds (ref 11.6–15.2)

## 2011-02-09 LAB — BASIC METABOLIC PANEL
CO2: 28 mEq/L (ref 19–32)
Chloride: 104 mEq/L (ref 96–112)
GFR calc Af Amer: >60 mL/min (ref >60)
Potassium: 3.5 mEq/L (ref 3.5–5.1)
Sodium: 139 mEq/L (ref 135–145)

## 2011-02-09 LAB — CBC
Platelets: 324 10*3/uL (ref 150–400)
RBC: 4.1 MIL/uL (ref 3.87–5.11)
RDW: 14.1 % (ref 11.5–15.5)
WBC: 10.5 10*3/uL (ref 4.0–10.5)

## 2011-02-09 LAB — GLUCOSE, CAPILLARY: Glucose-Capillary: 166 mg/dL — ABNORMAL HIGH (ref 70–99)

## 2011-02-09 NOTE — Procedures (Signed)
NAMEANIYLAH, AVANS NO.:  [id number]  MEDICAL RECORD NO.:  [id number]  LOCATION:  6524                         FACILITY:  [facility]  PHYSICIAN:  Nanetta Batty, M.D.   DATE OF BIRTH:  09-19-1960  DATE OF PROCEDURE: DATE OF DISCHARGE:                   PERIPHERAL VASCULAR INVASIVE PROCEDURE   Ms. Allison [name] is a 50 year old mildly overweight married Caucasian female mother of 1 child.  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  She is referred through the courtesy of Dr. Konrad Felix for peripheral vascular evaluation because of right lower extremity claudication.  Risk factors include 30-pack-year history of tobacco abuse having quit 1 year ago at the time of the myocardial infarction. She has had her RCA intervened on radially.  She has had no recurrent symptoms.  She does have diabetes, hypertension, hyperlipidemia as well as family history.  Her father died of an MI.  She does see Dr. Leeanne Deed in [facility] for plantar fasciitis on the right side.  Dopplers performed in our office on August 9 revealed a right ABI 0.67 with a high-frequency signal in the right external iliac artery, the left ABI 0.87.  She presents now for angiography, potential intervention for lifestyle-limiting claudication.  PROCEDURE DESCRIPTION:  The patient was brought to the Second Floor [facility] in the postabsorptive state.  She was premedicated with p.o. Valium, IV fentanyl.  Left groin was prepped and shaved in usual sterile fashion.  Xylocaine 1% was used for local anesthesia.  A 5-French sheath was inserted into the left femoral artery using standard Seldinger technique.  A 5-French tennis racket catheter was used for midstream and distal abdominal aortography.  A right and left lower extremity geography was performed via the side-arm sheath on the left and crossover with end-hole on the right.  Visipaque dye was used for the entirety of the case.   Retrograde aortic pressures was monitored during the entirety of the case.  ANGIOGRAPHIC RESULTS: 1. Abdominal aorta.     a.     Renal arteries - normal.     b.     Infrarenal abdominal aorta - normal. 2. Left lower extremity;     a.     Normal three-vessel runoff. 3. Right lower extremity;     a.     Long subtotal occlusion of the right external iliac artery      beginning at its origin extending just above the femoral head.     b.     Three-vessel runoff.  IMPRESSION:  Ms. [name] has high-grade subtotally occluded right external iliac artery.  We will proceed with PTA and stenting with a 7- [place] crossover sheath and a nitinol self-expanding stent.  Contralateral access was obtained with a 035 Rosen wire and 7-French destination sheath.  The patient received a total of 11,000 units of heparin with an ACT of 220.  Total of approximately 220 mL of contrast was used during the case.  She did receive aspirin and Plavix at the end of the case.  The lesion was crossed with a 035 angled Glidewire and end-hole catheter.  This was then exchanged for a Versacore wire.  The entire diseased segment was predilated with a 4 x  10 balloon and a 9 x 80 nitinol EV3 Protege self-expanding stent was then carefully positioned just above the femoral head back to the internal and external iliac bifurcation and deployed under fluoroscopic control.  Postdilated with a 6 x 4 Abbott Armada balloon throughout its entirety.  The final angiographic resulted reduction of a subtotal occlusion to 0% residual with excellent flow.  The patient did have palpable pedal pulse in the right during the case.  The crossover sheath was then withdrawn across the bifurcation, exchanged for short 7-French sheath.  A "groin shot" was obtained to determine suitability for hemostatic closure using ExoSeal which was confirmed.  The ExoSeal closure device was then deployed successfully and light pressure was held for several  minutes establishing hemostasis.  The patient left lab in stable condition.  IMPRESSION:  Successful right external iliac artery PTA and stenting using a Protege nitinol self-expanding stent for lifestyle-limiting claudication.  The patient will be hydrated overnight, treat with aspirin and Plavix and discharge in the morning.  She will get a followup lower extremity Dopplers, ABIs, and will see me back after that in followup.  Dr. Elwin Mocha was notified of these results.     Nanetta Batty, M.D.     JB/MEDQ  D:  02/08/2011  T:  02/09/2011  Job:  [id number]  cc:   [facility] Elwin Mocha, MD  Electronically Signed by Nanetta Batty M.D. on 02/09/2011 03:37:44 PM

## 2011-02-27 ENCOUNTER — Encounter (INDEPENDENT_AMBULATORY_CARE_PROVIDER_SITE_OTHER): Payer: BC Managed Care – PPO | Admitting: Physician Assistant

## 2011-02-27 DIAGNOSIS — F3189 Other bipolar disorder: Secondary | ICD-10-CM

## 2011-03-25 NOTE — Discharge Summary (Signed)
Allison [name], BLASDEL NO.:  [id number]  MEDICAL RECORD NO.:  [id number]  LOCATION:  6524                         FACILITY:  [facility]  PHYSICIAN:  Nanetta Batty, M.D.   DATE OF BIRTH:  Apr 08, 1961  DATE OF ADMISSION:  02/08/2011 DATE OF DISCHARGE:  02/09/2011                              DISCHARGE SUMMARY   DISCHARGE DIAGNOSES: 1. Peripheral vascular disease status post percutaneous transluminal     coronary angioplasty/stent with a Protege to the right external     iliac artery. 2. History of tobacco abuse, had quit 1 year ago, 30 pack-year     history. 3. Coronary artery disease, status post left heart cath January 04, 2011,     showed an occluded right dominant coronary artery with normal LV     function, status post overlapping Promus drug-eluting stents. 4. Diabetes mellitus. 5. Hypertension. 6. Hyperlipidemia.  HOSPITAL COURSE:  Allison [name] is a 50 year old morbidly obese Caucasian female with a history of coronary artery disease, tobacco abuse, diabetes mellitus, hypertension, hyperlipidemia.  She had been complaining of right lower extremity claudication for the last 6-7 months.  She presented for elective peripheral angiogram which resulted in PTA and stenting to the right external iliac artery.  This procedure well without complications.  She has seen by Dr. Allyson Sabal, who felt she is stable for discharge home with followup lower extremity arterial Dopplers and return office visit with Dr. Allyson Sabal.  DISCHARGE LABS:  WBCs 10.5, hemoglobin 12.0, hematocrit 35.9, platelets 324.  Sodium 139, potassium 3.5, chloride 104, carbon dioxide 28, BUN 14, creatinine 0.70.  Glucose 139, calcium 9.1.  STUDIES/PROCEDURES:  January 05, 2011, status post peripheral vascular angiogram with PTA and stenting to the right external iliac artery resulting in a 90% stenosis to 0% residual.  DISCHARGE MEDICATIONS: 1. Lyrica 75 mg 1 capsule by mouth twice daily. 2. Apidra 30-50  units sliding scale subcutaneously 4 times daily. 3. Aspirin 81 mg enteric-coated 1 tablet by daily. 4. Bupropion XL 150 mg 1 tablet by mouth daily. 5. Calcium carbonate with vitamin D over-the-counter 1 tablet by mouth     twice daily. 6. Cinnamon over-the-counter 1 tablet by mouth twice daily. 7. Plavix 75 mg tablet 1 tablet by mouth daily. 8. Fenugreek 610 mg 1 tablet by mouth twice daily. 9. Fish oil 1000 mg 1 tablet by mouth twice daily. 10.Hydrocodone/ APAP 10/325 mg 1 tablet by mouth twice daily. 11.Januvia 100 mg 1 tablet by mouth daily. 12.Lantus 95 units subcutaneously daily at bedtime. 13.Magnesium chloride over-the-counter 1 tablet by mouth daily. 14.Meloxicam 15 mg by mouth daily. 15.Multivitamin therapeutic over the counter 1 tablet by mouth daily. 16.Nexium 40 mg 1 tablet by mouth twice daily. 17.Nitroglycerin 0.4 mg sublingual 1 tablet under tongue every 5     minutes up to 3 doses in total for chest pain. 18.Paxil CR 37.5 mg 1 tablet by mouth daily. 19.Potassium chloride over the counter 1 tablet by mouth daily. 20.Ranexa 500 mg 1 tablet by mouth twice daily. 21.Red yeast rice 1 tablet by mouth twice daily. 22.Trilipix 135 mg 1 capsule by mouth twice daily. 23.Tribenzo 40/5/25 mg 1 tablet by mouth daily. 24.Vitamin B12 over the counter  1 tablet mouth daily.  DISPOSITION:  Allison [name] will be discharged home in stable condition.  RECOMMENDATIONS:  She increase her activity slowly.  She may shower and bathe.  No lifting greater than 5 pounds for 3 days.  No driving for 3 days.  Recommend she is to have a heart-healthy low-carbohydrate/low sugar diet.  If cath site becomes red, painful, swollen or discharge of fluid or pus, she is to call our office.  She will follow with at our office for lower extremity anteriorly Dopplers and with Dr. Allyson Sabal thereafter.  Our office will call with the appointment time.    ______________________________ Wilburt Finlay,  PA   ______________________________ Nanetta Batty, M.D.    BH/MEDQ  D:  02/09/2011  T:  02/09/2011  Job:  [id number]  cc:   Elwin Mocha, MD  Electronically Signed by Wilburt Finlay PA on 02/19/2011 01:16:42 PM Electronically Signed by Nanetta Batty M.D. on 03/25/2011 11:40:10 AM

## 2011-04-09 LAB — URINE MICROSCOPIC-ADD ON

## 2011-04-09 LAB — URINALYSIS, ROUTINE W REFLEX MICROSCOPIC
Leukocytes, UA: NEGATIVE
Nitrite: NEGATIVE
Specific Gravity, Urine: 1.025
Urobilinogen, UA: 0.2
pH: 5.5

## 2011-04-09 LAB — BASIC METABOLIC PANEL
BUN: 10
CO2: 25
Chloride: 104
Creatinine, Ser: 0.57
GFR calc Af Amer: >60
Potassium: 3.3 — ABNORMAL LOW

## 2011-04-09 LAB — POCT PREGNANCY, URINE: Preg Test, Ur: NEGATIVE

## 2011-04-09 LAB — PREGNANCY, URINE: Preg Test, Ur: NEGATIVE

## 2011-04-09 LAB — CBC
HCT: 41.2
MCHC: 33.5
MCV: 87.8
RBC: 4.69
WBC: 9.2

## 2011-04-17 ENCOUNTER — Encounter (INDEPENDENT_AMBULATORY_CARE_PROVIDER_SITE_OTHER): Payer: BC Managed Care – PPO | Admitting: Physician Assistant

## 2011-04-17 DIAGNOSIS — F3189 Other bipolar disorder: Secondary | ICD-10-CM

## 2011-06-07 ENCOUNTER — Encounter: Payer: Self-pay | Admitting: Cardiology

## 2011-06-21 ENCOUNTER — Ambulatory Visit (INDEPENDENT_AMBULATORY_CARE_PROVIDER_SITE_OTHER): Payer: BC Managed Care – PPO | Admitting: Physician Assistant

## 2011-06-21 DIAGNOSIS — F3189 Other bipolar disorder: Secondary | ICD-10-CM

## 2011-06-21 MED ORDER — BUPROPION HCL ER (XL) 150 MG PO TB24: 150.0000 mg | ORAL_TABLET | Freq: Every day | ORAL | Status: DC

## 2011-06-21 MED ORDER — PAROXETINE HCL ER 37.5 MG PO TB24: 37.5000 mg | ORAL_TABLET | ORAL | Status: DC

## 2011-06-21 NOTE — Progress Notes (Signed)
   [facility] Follow-up Outpatient Visit  Allison [name] 03-07-1961  Date: 06/21/11   Subjective: [name] reports that she is enjoying her break from school at this time. She reports that all in all she is doing well. She denies any problems since we decreased her Wellbutrin. She reports that her sleep has improved. Her mood has been stable. She denies any suicidal or homicidal ideation. She denies any auditory or visual hallucinations  There were no vitals filed for this visit.  Mental Status Examination  Appearance: Casual Alert: Yes Attention: good  Cooperative: Yes Eye Contact: Good Speech: Clear and even Psychomotor Activity: Normal Memory/Concentration: Intact Oriented: person, place, time/date and situation Mood: Euthymic Affect: Congruent Thought Processes and Associations: Linear Fund of Knowledge: Good Thought Content:  Insight: Good Judgement: Good  Diagnosis: Bipolar 2  Treatment Plan: Continue current medications and followup in approximately 4 months  Morrison Mcbryar, PA

## 2011-09-11 ENCOUNTER — Other Ambulatory Visit: Payer: Self-pay | Admitting: Family Medicine

## 2011-09-11 DIAGNOSIS — Z1231 Encounter for screening mammogram for malignant neoplasm of breast: Secondary | ICD-10-CM

## 2011-10-18 ENCOUNTER — Ambulatory Visit
Admission: RE | Admit: 2011-10-18 | Discharge: 2011-10-18 | Disposition: A | Discharge status: Home / Self Care | Payer: BC Managed Care – PPO | Source: Ambulatory Visit | Attending: Family Medicine | Admitting: Family Medicine

## 2011-10-18 DIAGNOSIS — Z1231 Encounter for screening mammogram for malignant neoplasm of breast: Secondary | ICD-10-CM

## 2011-11-29 ENCOUNTER — Ambulatory Visit (INDEPENDENT_AMBULATORY_CARE_PROVIDER_SITE_OTHER): Payer: BC Managed Care – PPO | Admitting: Physician Assistant

## 2011-11-29 DIAGNOSIS — F3189 Other bipolar disorder: Secondary | ICD-10-CM

## 2011-11-29 MED ORDER — BUPROPION HCL ER (XL) 150 MG PO TB24: 150.0000 mg | ORAL_TABLET | Freq: Every day | ORAL | Status: DC

## 2011-11-29 MED ORDER — PAROXETINE HCL ER 37.5 MG PO TB24: 37.5000 mg | ORAL_TABLET | ORAL | Status: DC

## 2011-11-29 NOTE — Progress Notes (Signed)
   [facility] Follow-up Outpatient Visit  Allison [name] 1960/06/27  Date: 11/29/2011  Subjective: Allison [name] presents today to followup on medications prescribed for bipolar 2. She continues to do well on Wellbutrin and Paxil. She reports that her sleep has been recently affected by pain in her neck from bulging discs, and she has to sleep upright in a lounge chair. She states that her mood has been "pretty good." She is looking forward to finishing this school year and having some time off over the summer. She is eating well, she denies any suicidal or homicidal ideation, she denies any auditory or visual hallucinations.  There were no vitals filed for this visit.  Mental Status Examination  Appearance: Fairly groomed and casually dressed Alert: Yes Attention: good  Cooperative: Yes Eye Contact: Good Speech: Clear and coherent Psychomotor Activity: Increased Memory/Concentration: Intact Oriented: person, place, time/date and situation Mood: Euthymic Affect: Appropriate Thought Processes and Associations: Linear Fund of Knowledge: Good Thought Content: Normal Insight: Good Judgement: Good  Diagnosis: Bipolar 2  Treatment Plan: We'll continue her Wellbutrin XL 150 mg daily and Paxil CR 37.5 mg daily. She will return for followup in 6 months.  Jayln Madeira, PA-C

## 2012-06-16 ENCOUNTER — Ambulatory Visit (INDEPENDENT_AMBULATORY_CARE_PROVIDER_SITE_OTHER): Payer: BC Managed Care – PPO | Admitting: Physician Assistant

## 2012-06-16 DIAGNOSIS — F3189 Other bipolar disorder: Secondary | ICD-10-CM

## 2012-06-16 MED ORDER — BUPROPION HCL ER (XL) 150 MG PO TB24: 150.0000 mg | ORAL_TABLET | Freq: Every day | ORAL | Status: DC

## 2012-06-16 MED ORDER — PAROXETINE HCL ER 37.5 MG PO TB24: 37.5000 mg | ORAL_TABLET | ORAL | Status: DC

## 2012-06-16 NOTE — Progress Notes (Signed)
   [facility] Follow-up Outpatient Visit  Allison [name] 1961/01/16  Date: 06/16/2012   Subjective: [name] presents today to followup on her treatment for bipolar disorder. She reports that her mood has been stable, and she is happy most of the time. She endorses good sleep and appetite. She has gotten a new bed and is able to elevate the head, which gives her relief from her neck pain. She denies any recent mood disturbance or problems of behavior. She continues to drive a school bus, and is looking forward to 2 weeks off during the holiday break. She denies any suicidal or homicidal ideation. She denies any auditory or visual hallucinations.  There were no vitals filed for this visit.  Mental Status Examination  Appearance: Disheveled Alert: Yes Attention: good  Cooperative: Yes Eye Contact: Good Speech: Clear and coherent Psychomotor Activity: Normal Memory/Concentration: Intact Oriented: person, place, time/date and situation Mood: Euthymic Affect: Congruent Thought Processes and Associations: Linear Fund of Knowledge: Good Thought Content: Normal Insight: Good Judgement: Good  Diagnosis: Bipolar disorder, NOS  Treatment Plan: We will continue her Wellbutrin XL 150 mg daily, and Paxil CR 37.5 mg daily. She will return for followup in 6 months.  Laiza Veenstra, PA-C

## 2012-12-15 ENCOUNTER — Ambulatory Visit (INDEPENDENT_AMBULATORY_CARE_PROVIDER_SITE_OTHER): Payer: BC Managed Care – PPO | Admitting: Physician Assistant

## 2012-12-15 VITALS — BP 131/81 | HR 72 | Ht 62.0 in | Wt 277.6 lb

## 2012-12-15 DIAGNOSIS — F3189 Other bipolar disorder: Secondary | ICD-10-CM

## 2012-12-15 DIAGNOSIS — F319 Bipolar disorder, unspecified: Secondary | ICD-10-CM

## 2012-12-15 MED ORDER — BUPROPION HCL ER (XL) 150 MG PO TB24: 150.0000 mg | ORAL_TABLET | Freq: Every day | ORAL | Status: DC

## 2012-12-15 MED ORDER — PAROXETINE HCL ER 37.5 MG PO TB24: 37.5000 mg | ORAL_TABLET | ORAL | Status: DC

## 2012-12-15 NOTE — Progress Notes (Signed)
   [facility] Follow-up Outpatient Visit  [name] 30-Aug-1960  Date: 12/15/2012   Subjective: Allison [name] presents today to followup on her treatment for bipolar disorder. She reports that her mood has been stable. She reports that her mother died in 2022/07/30, and she continues to grieve that loss. She denies any need for grief counseling at this time. She has been experiencing some depression that last about one to 2 days.  She also endorses an occasional bout of anxiety. She denies any panic attacks. She reports that her sleep and appetite are both good. She reports that in general her health has been good. She denies any suicidal or homicidal ideation. She denies any auditory or visual hallucinations.  Filed Vitals:   12/15/12 1352  BP: 131/81  Pulse: 72    Mental Status Examination  Appearance: Casual Alert: Yes Attention: good  Cooperative: Yes Eye Contact: Good Speech: Clear and coherent Psychomotor Activity: Normal Memory/Concentration: Intact Oriented: person, place, time/date and situation Mood: Anxious Affect: Congruent Thought Processes and Associations: Linear Fund of Knowledge: Good Thought Content: Normal Insight: Good Judgement: Good  Diagnosis: Bipolar disorder not otherwise specified  Treatment Plan: We will continue her Wellbutrin XL 150 mg daily, and Paxil CR 37.5 mg daily. She will return for followup in 6 months. She is encouraged to call between appointments if needs arise.  Ramzi Brathwaite, PA-C

## 2013-01-20 ENCOUNTER — Other Ambulatory Visit: Payer: Self-pay | Admitting: Family Medicine

## 2013-01-20 DIAGNOSIS — N644 Mastodynia: Secondary | ICD-10-CM

## 2013-02-04 ENCOUNTER — Ambulatory Visit
Admission: RE | Admit: 2013-02-04 | Discharge: 2013-02-04 | Disposition: A | Discharge status: Home / Self Care | Payer: BC Managed Care – PPO | Source: Ambulatory Visit | Attending: Family Medicine | Admitting: Family Medicine

## 2013-02-04 DIAGNOSIS — N644 Mastodynia: Secondary | ICD-10-CM

## 2013-06-16 ENCOUNTER — Ambulatory Visit (INDEPENDENT_AMBULATORY_CARE_PROVIDER_SITE_OTHER): Payer: BC Managed Care – PPO | Admitting: Psychiatry

## 2013-06-16 ENCOUNTER — Encounter (HOSPITAL_COMMUNITY): Payer: Self-pay | Admitting: Psychiatry

## 2013-06-16 ENCOUNTER — Ambulatory Visit (HOSPITAL_COMMUNITY): Payer: Self-pay | Admitting: Physician Assistant

## 2013-06-16 VITALS — BP 114/67 | HR 77 | Ht 62.0 in | Wt 279.2 lb

## 2013-06-16 DIAGNOSIS — F3189 Other bipolar disorder: Secondary | ICD-10-CM

## 2013-06-16 DIAGNOSIS — F329 Major depressive disorder, single episode, unspecified: Secondary | ICD-10-CM

## 2013-06-16 DIAGNOSIS — F29 Unspecified psychosis not due to a substance or known physiological condition: Secondary | ICD-10-CM

## 2013-06-16 MED ORDER — BUPROPION HCL ER (XL) 150 MG PO TB24: 150.0000 mg | ORAL_TABLET | Freq: Every day | ORAL | Status: DC

## 2013-06-16 MED ORDER — PAROXETINE HCL ER 37.5 MG PO TB24: 37.5000 mg | ORAL_TABLET | ORAL | Status: DC

## 2013-06-16 NOTE — Progress Notes (Signed)
[facility] [address] Progress Note  Allison [name] [id number] 52 y.o.  06/16/2013 10:57 AM  Chief Complaint:  I need my medication  History of Present Illness: Patient is a 52 year old female who is seen in this office since 2003.  She was seen by a physician assistant who left the practice.  She is getting a diagnosis of bipolar disorder however patient admitted that most of the time she is depressed irritable and isolated.  She is taking Wellbutrin and Paxil for many years and has been stable.  Her agitation and mood swings are better.  In the past she had tried Risperdal which was discontinued because patient was doing much better.  She is sleeping 6-7 hours.  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  Patient endorses history of agitation and mood swings when she was taking care of her niece.  Her niece is living with her mother.  She is compliant with Paxil and Wellbutrin.  She denies any side effects including any tremors or shakes.  She denies any paranoia or any hallucination.  She likes her job.  She lives with her husband.  She has one 81 year old daughter who lives in [place].  Patient is not drinking or using any illegal substances.  Her primary care physician is Dr. Gilman Buttner at [facility].  Patient recently had blood work and reported her blood work results are normal.    Suicidal Ideation: No Plan Formed: No Patient has means to carry out plan: No  Homicidal Ideation: No Plan Formed: No Patient has means to carry out plan: No  Review of Systems: Psychiatric: Agitation: No Hallucination: No Depressed Mood: No Insomnia: No Hypersomnia: No Altered Concentration: No Feels Worthless: No Grandiose Ideas: No Belief In Special Powers: No New/Increased Substance Abuse: No Compulsions: No  Neurologic: Headache: No Seizure: No Paresthesias: No  Past Medical Family, Social History:  Patient was born and raised  in [place].  She has one 39 year old daughter who lives in [place].  She lives with her husband.  She's been married for more than 35 years.  She is working as a Midwife.  Patient has history of hyperlipidemia, diabetes mellitus, hypertension, obesity and coronary artery disease.  Her primary care physician is Dr.Clara Katrinka Blazing at [facility].  Outpatient Encounter Prescriptions as of 06/16/2013  Medication Sig  . acetaminophen (TYLENOL) 500 MG tablet Take 500 mg by mouth as needed.    Marland Kitchen amLODipine (NORVASC) 10 MG tablet Take 10 mg by mouth daily.    Marland Kitchen aspirin 325 MG tablet Take 325 mg by mouth daily.    Marland Kitchen atorvastatin (LIPITOR) 10 MG tablet Take 10 mg by mouth daily.  Marland Kitchen buPROPion (WELLBUTRIN XL) 150 MG 24 hr tablet Take 1 tablet (150 mg total) by mouth daily.  . Choline Fenofibrate (TRILIPIX) 135 MG capsule Take 135 mg by mouth daily.    . clopidogrel (PLAVIX) 75 MG tablet Take 75 mg by mouth daily.    Marland Kitchen esomeprazole (NEXIUM) 40 MG capsule Take 40 mg by mouth daily before breakfast.    . fish oil-omega-3 fatty acids 1000 MG capsule Take 1 capsule by mouth daily.    Marland Kitchen HYDROcodone-acetaminophen (NORCO) 5-325 MG per tablet Take 1 tablet by mouth as needed.    . insulin glargine (LANTUS) 100 UNIT/ML injection Inject 55 Units into the skin at bedtime.    . insulin glulisine (APIDRA) 100 UNIT/ML injection Inject into the skin.    Marland Kitchen  lisinopril-hydrochlorothiazide (PRINZIDE,ZESTORETIC) 20-25 MG per tablet Take 1 tablet by mouth daily.    . nitroGLYCERIN (NITROSTAT) 0.4 MG SL tablet Place 0.4 mg under the tongue every 5 (five) minutes as needed. May repeat for up to 3 doses.   Marland Kitchen PARoxetine (PAXIL-CR) 37.5 MG 24 hr tablet Take 1 tablet (37.5 mg total) by mouth every morning.  . sitaGLIPtin (JANUVIA) 100 MG tablet Take 100 mg by mouth daily.    . [DISCONTINUED] buPROPion (WELLBUTRIN XL) 150 MG 24 hr tablet Take 1 tablet (150 mg total) by mouth daily.  . [DISCONTINUED]  PARoxetine (PAXIL-CR) 37.5 MG 24 hr tablet Take 1 tablet (37.5 mg total) by mouth every morning.  . Cinnamon 500 MG TABS Take 1 tablet by mouth 2 (two) times daily.    . Red Yeast Rice 600 MG CAPS Take 1 capsule by mouth daily.      Past Psychiatric History/Hospitalization(s): Patient denies any history of suicidal and or any inpatient psychiatric treatment.  She admitted history of mood swings, anger and irritability.  She is seen in this office since 2003.  She was given Risperdal however it was discontinued because patient was feeling better. Anxiety: Yes Bipolar Disorder: Yes Depression: Yes Mania: Yes Psychosis: No Schizophrenia: No Personality Disorder: No Hospitalization for psychiatric illness: No History of Electroconvulsive Shock Therapy: No Prior Suicide Attempts: No  Physical Exam: Constitutional:  BP 114/67  Pulse 77  Ht 5\' 2"  (1.575 m)  Wt 279 lb 3.2 oz (126.644 kg)  BMI 51.05 kg/m2  General Appearance: well nourished and obese  Musculoskeletal: Strength & Muscle Tone: within normal limits Gait & Station: normal Patient leans: N/A  Psychiatric: Speech (describe rate, volume, coherence, spontaneity, and abnormalities if any): Clear and coherent with normal tone and volume.    Thought Process (describe rate, content, abstract reasoning, and computation): Logical and goal directed.  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 reviewed her history, current medication and collateral information.  Patient is doing better on the Wellbutrin XL 150 mg daily and Paxil CR 37.5 mg daily.  We do not have any recent blood work.  Patient reported she has complete and a checkup and blood work recently at her primary care physician.  We will get her consent for collateral information.  I will continue Wellbutrin and Paxil at present dose.  Patient is tolerating this medication without any side effects.  Recommend colors back if she has any question or any concern.  Followup in 3 months.Time spent 25 minutes.  More than 50% of the time spent in psychoeducation, counseling and coordination of care.  Discuss safety plan that anytime having active suicidal thoughts or homicidal thoughts then patient need to call 911 or go to the local emergency room.    ARFEEN,SYED T., MD 06/16/2013

## 2013-08-31 ENCOUNTER — Other Ambulatory Visit (HOSPITAL_COMMUNITY): Payer: Self-pay | Admitting: Psychiatry

## 2013-09-14 ENCOUNTER — Ambulatory Visit (INDEPENDENT_AMBULATORY_CARE_PROVIDER_SITE_OTHER): Payer: BC Managed Care – PPO | Admitting: Psychiatry

## 2013-09-14 ENCOUNTER — Encounter (HOSPITAL_COMMUNITY): Payer: Self-pay | Admitting: Psychiatry

## 2013-09-14 VITALS — BP 141/69 | HR 73 | Ht 62.0 in | Wt 280.4 lb

## 2013-09-14 DIAGNOSIS — F29 Unspecified psychosis not due to a substance or known physiological condition: Secondary | ICD-10-CM

## 2013-09-14 DIAGNOSIS — F329 Major depressive disorder, single episode, unspecified: Secondary | ICD-10-CM

## 2013-09-14 MED ORDER — PAROXETINE HCL ER 37.5 MG PO TB24: 37.5000 mg | ORAL_TABLET | ORAL | Status: DC

## 2013-09-14 MED ORDER — BUPROPION HCL ER (XL) 150 MG PO TB24: 150.0000 mg | ORAL_TABLET | Freq: Every day | ORAL | Status: DC

## 2013-09-14 NOTE — Progress Notes (Signed)
[facility] [address] Progress Note  Allison [name] [id number] 53 y.o.  09/14/2013 11:13 AM  Chief Complaint:  Medication management and followup   History of Present Illness: Danean came for her followup appointment.  Patient is taking Wellbutrin and Paxil and has been stable.  She denies any recent paranoia, hallucination or any agitation.  She was diagnosed with bipolar disorder by physician assistant however patient mentioned that most of the time she remains sad and depressed and does not believe that she has bipolar disorder.  She was prescribed Risperdal which was discontinued and since then patient does not have any sedation or worsening of agitation or anger.  She likes her job.  Recently she seen her primary care physician and she is happy that her hemoglobin A1c is improved.  She is sleeping 6-7 hours.  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  She denies any side effects including any tremors or shakes.  She denies any paranoia or any hallucination.  She likes her job.  She lives with her husband.  She has one 36 year old daughter who lives in [place].  Patient is not drinking or using any illegal substances.  Her primary care physician is Dr. Gilman Buttner at [facility].  We received permission from her primary care physician and reporting her blood work.  Hemoglobin A1c is 6.9.  Her UDS is negative.  She is taking multiple medication for her health needs .    Suicidal Ideation: No Plan Formed: No Patient has means to carry out plan: No  Homicidal Ideation: No Plan Formed: No Patient has means to carry out plan: No  Review of Systems: Psychiatric: Agitation: No Hallucination: No Depressed Mood: No Insomnia: No Hypersomnia: No Altered Concentration: No Feels Worthless: No Grandiose Ideas: No Belief In Special Powers: No New/Increased Substance Abuse: No Compulsions: No  Neurologic: Headache: No Seizure:  No Paresthesias: No  Past Medical Family, Social History:  Patient was born and raised in [place].  She has one 69 year old daughter who lives in [place].  She lives with her husband.  She's been married for more than 35 years.  She is working as a Midwife.  Patient has history of hyperlipidemia, diabetes mellitus, hypertension, obesity and coronary artery disease.  Her primary care physician is Dr.Clara Katrinka Blazing at [facility].  Outpatient Encounter Prescriptions as of 09/14/2013  Medication Sig  . acetaminophen (TYLENOL) 500 MG tablet Take 500 mg by mouth as needed.    Marland Kitchen amLODipine (NORVASC) 10 MG tablet Take 10 mg by mouth daily.    Marland Kitchen aspirin 325 MG tablet Take 325 mg by mouth daily.    Marland Kitchen atorvastatin (LIPITOR) 10 MG tablet Take 10 mg by mouth daily.  Marland Kitchen buPROPion (WELLBUTRIN XL) 150 MG 24 hr tablet Take 1 tablet (150 mg total) by mouth daily.  . Choline Fenofibrate (TRILIPIX) 135 MG capsule Take 135 mg by mouth daily.    . Cinnamon 500 MG TABS Take 1 tablet by mouth 2 (two) times daily.    . clopidogrel (PLAVIX) 75 MG tablet Take 75 mg by mouth daily.    Marland Kitchen esomeprazole (NEXIUM) 40 MG capsule Take 40 mg by mouth daily before breakfast.    . fish oil-omega-3 fatty acids 1000 MG capsule Take 1 capsule by mouth daily.    Marland Kitchen HYDROcodone-acetaminophen (NORCO) 5-325 MG per tablet Take 1 tablet by mouth as needed.    . insulin glargine (LANTUS) 100  UNIT/ML injection Inject 55 Units into the skin at bedtime.    . insulin glulisine (APIDRA) 100 UNIT/ML injection Inject into the skin.    Marland Kitchen. lisinopril-hydrochlorothiazide (PRINZIDE,ZESTORETIC) 20-25 MG per tablet Take 1 tablet by mouth daily.    . nitroGLYCERIN (NITROSTAT) 0.4 MG SL tablet Place 0.4 mg under the tongue every 5 (five) minutes as needed. May repeat for up to 3 doses.   Marland Kitchen. PARoxetine (PAXIL-CR) 37.5 MG 24 hr tablet Take 1 tablet (37.5 mg total) by mouth every morning.  . Red Yeast Rice 600 MG CAPS Take 1 capsule  by mouth daily.    . sitaGLIPtin (JANUVIA) 100 MG tablet Take 100 mg by mouth daily.    . [DISCONTINUED] buPROPion (WELLBUTRIN XL) 150 MG 24 hr tablet Take 1 tablet (150 mg total) by mouth daily.  . [DISCONTINUED] PARoxetine (PAXIL-CR) 37.5 MG 24 hr tablet Take 1 tablet (37.5 mg total) by mouth every morning.    Past Psychiatric History/Hospitalization(s): Patient denies any history of suicidal and or any inpatient psychiatric treatment.  She admitted history of mood swings, anger and irritability.  She is seen in this office since 2003.  She was given Risperdal however it was discontinued because patient was feeling better. Anxiety: Yes Bipolar Disorder: Yes Depression: Yes Mania: Yes Psychosis: No Schizophrenia: No Personality Disorder: No Hospitalization for psychiatric illness: No History of Electroconvulsive Shock Therapy: No Prior Suicide Attempts: No  Physical Exam: Constitutional:  BP 141/69  Pulse 73  Ht 5\' 2"  (1.575 m)  Wt 280 lb 6.4 oz (127.189 kg)  BMI 51.27 kg/m2  General Appearance: well nourished and obese  Musculoskeletal: Strength & Muscle Tone: within normal limits Gait & Station: normal Patient leans: N/A  Psychiatric: Speech (describe rate, volume, coherence, spontaneity, and abnormalities if any): Clear and coherent with normal tone and volume.    Thought Process (describe rate, content, abstract reasoning, and computation): Logical and goal directed.  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  She does not have any tremors or shakes.  I reviewed records from her primary care physician including blood work and her current medication.  Her hemoglobin A1c is 6.9, vitamin D 28.57 TSH 1.364 and her ferritin level is 60.0.  At this time I will not change any medication or dosage.  Patient is doing much better on her current psychotropic medication.  Followup in 3 months.  Recommended to call us back if she has any question or any concern.    Janelle Spellman T., MD 09/14/2013

## 2013-12-05 ENCOUNTER — Other Ambulatory Visit (HOSPITAL_COMMUNITY): Payer: Self-pay | Admitting: Psychiatry

## 2013-12-09 ENCOUNTER — Other Ambulatory Visit (HOSPITAL_COMMUNITY): Payer: Self-pay | Admitting: *Deleted

## 2013-12-09 NOTE — Telephone Encounter (Signed)
Received refill request for Bupropion and ParoxetineER.Last RX given 09/14/13 for 30 day supply with 2 refills.Next appt 12/15/13. Faxed requests back to pharmacy with note that refills will be given at appt.

## 2013-12-14 ENCOUNTER — Other Ambulatory Visit (HOSPITAL_COMMUNITY): Payer: Self-pay | Admitting: Psychiatry

## 2013-12-15 ENCOUNTER — Encounter (HOSPITAL_COMMUNITY): Payer: Self-pay | Admitting: Psychiatry

## 2013-12-15 ENCOUNTER — Ambulatory Visit (INDEPENDENT_AMBULATORY_CARE_PROVIDER_SITE_OTHER): Payer: BC Managed Care – PPO | Admitting: Psychiatry

## 2013-12-15 VITALS — BP 140/72 | HR 72 | Ht 62.0 in | Wt 276.2 lb

## 2013-12-15 DIAGNOSIS — F331 Major depressive disorder, recurrent, moderate: Secondary | ICD-10-CM

## 2013-12-15 DIAGNOSIS — F323 Major depressive disorder, single episode, severe with psychotic features: Secondary | ICD-10-CM

## 2013-12-15 MED ORDER — BUPROPION HCL ER (XL) 150 MG PO TB24: 150.0000 mg | ORAL_TABLET | Freq: Every day | ORAL | Status: DC

## 2013-12-15 MED ORDER — PAROXETINE HCL ER 37.5 MG PO TB24: 37.5000 mg | ORAL_TABLET | ORAL | Status: DC

## 2013-12-15 NOTE — Progress Notes (Signed)
[facility] [id number] Progress Note  Allison SallesRobin M [name] [id number] 53 y.o.  12/15/2013 11:16 AM  Chief Complaint:  Medication management and followup   History of Present Illness: Allison BallRobin came for her followup appointment.  Patient is taking Wellbutrin and Paxil and denies any side effects.  She denies any agitation, irritability or any anger.  She denies any crying spells.  Since she stopped Risperdal she has increased energy.  Her sleep is improved.  She denies any mood swings of any crying spells.  Her appetite is good.  She is thinking to take holiday vacation with the family since school is closed.  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  She denies any side effects including any tremors or shakes.  Her blood sugar is improved from the past.  She has good energy level.  Her vitals are stable.  Her primary care physician is Dr. Gilman Buttnerlara Smith at [facility].  Recently her blood pressure medicine was changed and she is happy because it is working.    Suicidal Ideation: No Plan Formed: No Patient has means to carry out plan: No  Homicidal Ideation: No Plan Formed: No Patient has means to carry out plan: No  Review of Systems: Psychiatric: Agitation: No Hallucination: No Depressed Mood: No Insomnia: No Hypersomnia: No Altered Concentration: No Feels Worthless: No Grandiose Ideas: No Belief In Special Powers: No New/Increased Substance Abuse: No Compulsions: No  Neurologic: Headache: No Seizure: No Paresthesias: No  Past Medical Family, Social History:  Patient was born [place] in [place].  She has one 52 year old daughter who lives in [place].  She lives with her husband.  She's been married for more than 35 years.  She is working as a Midwifebus driver.  Patient has history of hyperlipidemia, diabetes mellitus, hypertension, obesity and coronary artery disease.  Her primary care physician is Dr.Clara Katrinka BlazingSmith at [facility].  Outpatient Encounter Prescriptions as of 12/15/2013  Medication Sig  . atorvastatin (LIPITOR) 10 MG tablet Take 10 mg by mouth daily.  Marland Kitchen. buPROPion (WELLBUTRIN XL) 150 MG 24 hr tablet Take 1 tablet (150 mg total) by mouth daily.  . Choline Fenofibrate (TRILIPIX) 135 MG capsule Take 135 mg by mouth daily.    Marland Kitchen. esomeprazole (NEXIUM) 40 MG capsule Take 40 mg by mouth daily before breakfast.    . fish oil-omega-3 fatty acids 1000 MG capsule Take 1 capsule by mouth daily.    Marland Kitchen. HYDROcodone-acetaminophen (NORCO) 5-325 MG per tablet Take 1 tablet by mouth as needed.    . insulin glargine (LANTUS) 100 UNIT/ML injection Inject 55 Units into the skin at bedtime.    . insulin glulisine (APIDRA) 100 UNIT/ML injection Inject into the skin.    . Olmesartan-Amlodipine-HCTZ (TRIBENZOR) 40-5-25 MG TABS Take by mouth.  Marland Kitchen. PARoxetine (PAXIL-CR) 37.5 MG 24 hr tablet Take 1 tablet (37.5 mg total) by mouth every morning.  . sitaGLIPtin (JANUVIA) 100 MG tablet Take 100 mg by mouth daily.    . [DISCONTINUED] buPROPion (WELLBUTRIN XL) 150 MG 24 hr tablet Take 1 tablet (150 mg total) by mouth daily.  . [DISCONTINUED] PARoxetine (PAXIL-CR) 37.5 MG 24 hr tablet Take 1 tablet (37.5 mg total) by mouth every morning.  Marland Kitchen. acetaminophen (TYLENOL) 500 MG tablet Take 500 mg by mouth as needed.    Marland Kitchen. aspirin 325 MG tablet Take 325 mg by mouth daily.    . clopidogrel (PLAVIX) 75 MG tablet Take 75  mg by mouth daily.    Marland Kitchen. lisinopril-hydrochlorothiazide (PRINZIDE,ZESTORETIC) 20-25 MG per tablet Take 1 tablet by mouth daily.    . nitroGLYCERIN (NITROSTAT) 0.4 MG SL tablet Place 0.4 mg under the tongue every 5 (five) minutes as needed. May repeat for up to 3 doses.   . [DISCONTINUED] amLODipine (NORVASC) 10 MG tablet Take 10 mg by mouth daily.    . [DISCONTINUED] Cinnamon 500 MG TABS Take 1 tablet by mouth 2 (two) times daily.    . [DISCONTINUED] Red Yeast Rice 600 MG CAPS Take 1 capsule by mouth daily.       Past Psychiatric History/Hospitalization(s): Patient denies any history of suicidal and or any inpatient psychiatric treatment.  She admitted history of mood swings, anger and irritability.  She is seen in this office since 2003.  She was given Risperdal however it was discontinued because patient was feeling better. Anxiety: Yes Bipolar Disorder: Yes Depression: Yes Mania: Yes Psychosis: No Schizophrenia: No Personality Disorder: No Hospitalization for psychiatric illness: No History of Electroconvulsive Shock Therapy: No Prior Suicide Attempts: No  Physical Exam: Constitutional:  BP 140/72  Pulse 72  Ht 5\' 2"  (1.575 m)  Wt 276 lb 3.2 oz (125.283 kg)  BMI 50.50 kg/m2  General Appearance: well nourished and obese  Musculoskeletal: Strength & Muscle Tone: within normal limits Gait & Station: normal Patient leans: N/A  Psychiatric: Speech (describe rate, volume, coherence, spontaneity, and abnormalities if any): Clear and coherent with normal tone and volume.    Thought Process (describe rate, content, abstract reasoning, and computation): Logical and goal directed.  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  She does not have any tremors or shakes.  Followup in 3 months.  Recommended to call us back if she has any question or any concern.    ARFEEN,SYED T., MD 12/15/2013

## 2014-01-15 ENCOUNTER — Other Ambulatory Visit: Payer: Self-pay | Admitting: Family Medicine

## 2014-01-15 DIAGNOSIS — Z1231 Encounter for screening mammogram for malignant neoplasm of breast: Secondary | ICD-10-CM

## 2014-02-05 ENCOUNTER — Ambulatory Visit: Payer: Self-pay

## 2014-02-25 ENCOUNTER — Telehealth (HOSPITAL_COMMUNITY): Payer: Self-pay | Admitting: *Deleted

## 2014-02-25 NOTE — Telephone Encounter (Signed)
Patient left VM:Had to reschedule appt from 9/23 to 9/29.Medicine will not last until then. Please check to see if she needs refills.  Phoned pt and left message: Per chart, last RX for Paxil and Wellbutrin sent on 6/23 for 30 day supply with 2 refills which should last until about 9/20. Advised her to contact pharmacy when she has 5 days supply of medicine left. They will contact office with request

## 2014-03-09 ENCOUNTER — Other Ambulatory Visit (HOSPITAL_COMMUNITY): Payer: Self-pay | Admitting: Psychiatry

## 2014-03-17 ENCOUNTER — Other Ambulatory Visit (HOSPITAL_COMMUNITY): Payer: Self-pay | Admitting: Psychiatry

## 2014-03-17 ENCOUNTER — Ambulatory Visit (HOSPITAL_COMMUNITY): Payer: Self-pay | Admitting: Psychiatry

## 2014-03-23 ENCOUNTER — Encounter (HOSPITAL_COMMUNITY): Payer: Self-pay | Admitting: Psychiatry

## 2014-03-23 ENCOUNTER — Ambulatory Visit (INDEPENDENT_AMBULATORY_CARE_PROVIDER_SITE_OTHER): Payer: BC Managed Care – PPO | Admitting: Psychiatry

## 2014-03-23 VITALS — Wt 279.0 lb

## 2014-03-23 DIAGNOSIS — F330 Major depressive disorder, recurrent, mild: Secondary | ICD-10-CM

## 2014-03-23 DIAGNOSIS — F323 Major depressive disorder, single episode, severe with psychotic features: Secondary | ICD-10-CM

## 2014-03-23 MED ORDER — BUPROPION HCL ER (XL) 150 MG PO TB24: 150.0000 mg | ORAL_TABLET | Freq: Every day | ORAL | Status: DC

## 2014-03-23 MED ORDER — PAROXETINE HCL ER 37.5 MG PO TB24: 37.5000 mg | ORAL_TABLET | Freq: Every day | ORAL | Status: DC

## 2014-03-23 NOTE — Progress Notes (Signed)
[facility] [address] Progress Note  Allison [name] [id number] 53 y.o.  03/23/2014 1:36 PM  Chief Complaint:  Medication management and followup   History of Present Illness: Allison [name] came for her followup appointment.  Patient is and Wellbutrin and Paxil.  Is effects.  Sometimes she gets irritable and angry however her sleep is improved from the past.  She saw her primary care physician last week when she had blood work however results are pending.  She admitted some time not taking her insulin and she sneak on diet .  However her depression is overall stable.  She denies any tremors, shakes or any EPS.  She denies any crying spells.  Her energy level is good.  She lives with her husband who is very supportive.  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  Her vitals are stable.  Her primary care physician is Dr. Gilman Buttner at [facility].      Suicidal Ideation: No Plan Formed: No Patient has means to carry out plan: No  Homicidal Ideation: No Plan Formed: No Patient has means to carry out plan: No  Review of Systems: Psychiatric: Agitation: No Hallucination: No Depressed Mood: No Insomnia: No Hypersomnia: No Altered Concentration: No Feels Worthless: No Grandiose Ideas: No Belief In Special Powers: No New/Increased Substance Abuse: No Compulsions: No  Neurologic: Headache: No Seizure: No Paresthesias: No  Past Medical Family, Social History:  Patient was born and raised in [place].  She has one 31 year old daughter who lives in [place].  She lives with her husband.  She's been married for more than 35 years.  She is working as a Midwife.  Patient has history of hyperlipidemia, diabetes mellitus, hypertension, obesity and coronary artery disease.  Her primary care physician is Dr.Clara Katrinka Blazing at [facility].  Outpatient Encounter Prescriptions as of 03/23/2014  Medication Sig  .  acetaminophen (TYLENOL) 500 MG tablet Take 500 mg by mouth as needed.    Marland Kitchen aspirin 325 MG tablet Take 325 mg by mouth daily.    Marland Kitchen atorvastatin (LIPITOR) 10 MG tablet Take 10 mg by mouth daily.  Marland Kitchen buPROPion (WELLBUTRIN XL) 150 MG 24 hr tablet Take 1 tablet (150 mg total) by mouth daily.  . Choline Fenofibrate (TRILIPIX) 135 MG capsule Take 135 mg by mouth daily.    . clopidogrel (PLAVIX) 75 MG tablet Take 75 mg by mouth daily.    Marland Kitchen esomeprazole (NEXIUM) 40 MG capsule Take 40 mg by mouth daily before breakfast.    . fish oil-omega-3 fatty acids 1000 MG capsule Take 1 capsule by mouth daily.    Marland Kitchen HYDROcodone-acetaminophen (NORCO) 5-325 MG per tablet Take 1 tablet by mouth as needed.    . insulin glargine (LANTUS) 100 UNIT/ML injection Inject 55 Units into the skin at bedtime.    . insulin glulisine (APIDRA) 100 UNIT/ML injection Inject into the skin.    Marland Kitchen lisinopril-hydrochlorothiazide (PRINZIDE,ZESTORETIC) 20-25 MG per tablet Take 1 tablet by mouth daily.    . nitroGLYCERIN (NITROSTAT) 0.4 MG SL tablet Place 0.4 mg under the tongue every 5 (five) minutes as needed. May repeat for up to 3 doses.   . Olmesartan-Amlodipine-HCTZ (TRIBENZOR) 40-5-25 MG TABS Take by mouth.  Marland Kitchen PARoxetine (PAXIL-CR) 37.5 MG 24 hr tablet Take 1 tablet (37.5 mg total) by mouth daily.  . sitaGLIPtin (JANUVIA) 100 MG tablet Take 100 mg by mouth daily.    . [DISCONTINUED] buPROPion [facility]  XL) 150 MG 24 hr tablet take 1 tablet by mouth once daily  . [DISCONTINUED] PARoxetine (PAXIL-CR) 37.5 MG 24 hr tablet take 1 tablet by mouth every morning    Past Psychiatric History/Hospitalization(s): Patient denies any history of suicidal and or any inpatient psychiatric treatment.  She admitted history of mood swings, anger and irritability.  She is seen in this office since 2003.  She was given Risperdal however it was discontinued because patient was feeling better. Anxiety: Yes Bipolar Disorder: Yes Depression: Yes Mania:  Yes Psychosis: No Schizophrenia: No Personality Disorder: No Hospitalization for psychiatric illness: No History of Electroconvulsive Shock Therapy: No Prior Suicide Attempts: No  Physical Exam: Constitutional:  Wt 279 lb (126.554 kg)  General Appearance: well nourished and obese  Musculoskeletal: Strength & Muscle Tone: within normal limits Gait & Station: normal Patient leans: N/A  Psychiatric: Speech (describe rate, volume, coherence, spontaneity, and abnormalities if any): Clear and coherent with normal tone and volume.    Thought Process (describe rate, content, abstract reasoning, and computation): Logical and goal directed.  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  She does not have any tremors or shakes.  Recommended to have her blood results faxed to us .  Followup in 3 months.  Recommended to call us back if she has any question or any concern.    Kostas Marrow T., MD 03/23/2014

## 2014-06-10 ENCOUNTER — Ambulatory Visit (INDEPENDENT_AMBULATORY_CARE_PROVIDER_SITE_OTHER): Payer: BC Managed Care – PPO | Admitting: Psychiatry

## 2014-06-10 ENCOUNTER — Encounter (HOSPITAL_COMMUNITY): Payer: Self-pay | Admitting: Psychiatry

## 2014-06-10 VITALS — BP 135/68 | HR 70 | Ht 62.0 in | Wt 283.6 lb

## 2014-06-10 DIAGNOSIS — F330 Major depressive disorder, recurrent, mild: Secondary | ICD-10-CM

## 2014-06-10 DIAGNOSIS — F332 Major depressive disorder, recurrent severe without psychotic features: Secondary | ICD-10-CM

## 2014-06-10 MED ORDER — BUPROPION HCL ER (XL) 150 MG PO TB24: 150.0000 mg | ORAL_TABLET | Freq: Every day | ORAL | Status: DC

## 2014-06-10 MED ORDER — PAROXETINE HCL ER 37.5 MG PO TB24: 37.5000 mg | ORAL_TABLET | Freq: Every day | ORAL | Status: DC

## 2014-06-10 NOTE — Progress Notes (Signed)
[facility] [id number] Progress Note  Allison [name] M [name] [id number] 53 y.o.  06/10/2014 10:03 AM  Chief Complaint:  Medication management and followup   History of Present Illness: [name] came for her followup appointment.  She is feeling tired and exhausted.  She is taking any medication for her GI symptoms and that is not helping her.  She has lot of diarrhea and loose motion.  She scheduled to see primary care physician to talk about it.  Overall her depression is a stable.  She likes Wellbutrin and Paxil.  She sleeping good.  She is excited about time off for Christmas.  She is planning to visit her nephew and niece on Christmas.  Patient denies any irritability, anger, mood swing.  She denies any shakes or tremors.  Her appetite is okay.  She wants to continue Wellbutrin and Paxil.  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  She lives with her husband who is very supportive.  Her vitals are stable.  Her primary care physician is Dr. Gilman [facility]ner[facility]ara Smith [facility].      Suicidal Ideation: No Plan Formed: No Patient has means to carry out plan: No  Homicidal Ideation: No Plan Formed: No Patient has means to carry out plan: No  Review of Systems: Psychiatric: Agitation: No Hallucination: No Depressed Mood: No Insomnia: No Hypersomnia: No Altered Concentration: No Feels Worthless: No Grandiose Ideas: No Belief In Special Powers: No New/Increased Substance Abuse: No Compulsions: No  Neurologic: Headache: No Seizure: No Paresthesias: No  Past Medical Family, Social History:  Patient was born [place].  She has one 52 year old daughter who lives in [place].  She lives with her husband.  She's been married for more than 35 years.  She is working as a Midwifebus driver.  Patient has history of hyperlipidemia, diabetes mellitus, hypertension, obesity and coronary artery disease.  Her primary care physician is  Dr.Clara [facility]inka [facility]Smith [facility].  Outpatient Encounter Prescriptions as of 06/10/2014  Medication Sig  . acetaminophen (TYLENOL) 500 MG tablet Take 500 mg by mouth as needed.    Marland Kitchen. aspirin 325 MG tablet Take 325 mg by mouth daily.    Marland Kitchen. atorvastatin (LIPITOR) 10 MG tablet Take 10 mg by mouth daily.  Marland Kitchen. buPROPion (WELLBUTRIN XL) 150 MG 24 hr tablet Take 1 tablet (150 mg total) by mouth daily.  . Choline Fenofibrate (TRILIPIX) 135 MG capsule Take 135 mg by mouth daily.    . clopidogrel (PLAVIX) 75 MG tablet Take 75 mg by mouth daily.    . fish oil-omega-3 fatty acids 1000 MG capsule Take 1 capsule by mouth daily.    Marland Kitchen. HYDROcodone-acetaminophen (NORCO) 10-325 MG per tablet   . insulin glargine (LANTUS) 100 UNIT/ML injection Inject 55 Units into the skin at bedtime.    . insulin glulisine (APIDRA) 100 UNIT/ML injection Inject into the skin.    Marland Kitchen. lisinopril-hydrochlorothiazide (PRINZIDE,ZESTORETIC) 20-25 MG per tablet Take 1 tablet by mouth daily.    . nitroGLYCERIN (NITROSTAT) 0.4 MG SL tablet Place 0.4 mg under the tongue every 5 (five) minutes as needed. May repeat for up to 3 doses.   . Olmesartan-Amlodipine-HCTZ (TRIBENZOR) 40-5-25 MG TABS Take by mouth.  Marland Kitchen. PARoxetine (PAXIL-CR) 37.5 MG 24 hr tablet Take 1 tablet (37.5 mg total) by mouth daily.  . sitaGLIPtin (JANUVIA) 100 MG tablet Take 100 mg by mouth daily.    . [DISCONTINUED] buPROPion (WELLBUTRIN XL)  150 MG 24 hr tablet Take 1 tablet (150 mg total) by mouth daily.  . [DISCONTINUED] esomeprazole (NEXIUM) 40 MG capsule Take 40 mg by mouth daily before breakfast.    . [DISCONTINUED] HYDROcodone-acetaminophen (NORCO) 5-325 MG per tablet Take 1 tablet by mouth as needed.    . [DISCONTINUED] PARoxetine (PAXIL-CR) 37.5 MG 24 hr tablet Take 1 tablet (37.5 mg total) by mouth daily.    Past Psychiatric History/Hospitalization(s): Patient denies any history of suicidal and or any inpatient psychiatric treatment.  She admitted  history of mood swings, anger and irritability.  She is seen in this office since 2003.  She was given Risperdal however it was discontinued because patient was feeling better. Anxiety: Yes Bipolar Disorder: Yes Depression: Yes Mania: Yes Psychosis: No Schizophrenia: No Personality Disorder: No Hospitalization for psychiatric illness: No History of Electroconvulsive Shock Therapy: No Prior Suicide Attempts: No  Physical Exam: Constitutional:  BP 135/68 mmHg  Pulse 70  Ht 5\' 2"  (1.575 m)  Wt 283 lb 9.6 oz (128.64 kg)  BMI 51.86 kg/m2  General Appearance: well nourished and obese  Musculoskeletal: Strength & Muscle Tone: within normal limits Gait & Station: normal Patient leans: N/A  Psychiatric: Speech (describe rate, volume, coherence, spontaneity, and abnormalities if any): Clear and coherent with normal tone and volume.    Thought Process (describe rate, content, abstract reasoning, and computation): Logical and goal directed.  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  She has no side effects.  I will continue Paxil 37.5 mg daily and Wellbutrin XL 150 mg daily.  Recommended to call us back if she has any question, concern or if he feel worsening of the symptom.  I will see her again in 3 months.  Noelle Sease T., MD 06/10/2014

## 2014-06-14 ENCOUNTER — Ambulatory Visit (HOSPITAL_COMMUNITY): Payer: Self-pay | Admitting: Psychiatry

## 2014-09-09 ENCOUNTER — Ambulatory Visit (INDEPENDENT_AMBULATORY_CARE_PROVIDER_SITE_OTHER): Payer: BC Managed Care – PPO | Admitting: Psychiatry

## 2014-09-09 ENCOUNTER — Encounter (HOSPITAL_COMMUNITY): Payer: Self-pay | Admitting: Psychiatry

## 2014-09-09 VITALS — BP 164/79 | HR 88 | Ht 62.0 in | Wt 282.0 lb

## 2014-09-09 DIAGNOSIS — F330 Major depressive disorder, recurrent, mild: Secondary | ICD-10-CM

## 2014-09-09 DIAGNOSIS — F333 Major depressive disorder, recurrent, severe with psychotic symptoms: Secondary | ICD-10-CM | POA: Diagnosis not present

## 2014-09-09 MED ORDER — BUPROPION HCL ER (XL) 150 MG PO TB24: 150.0000 mg | ORAL_TABLET | Freq: Every day | ORAL | Status: DC

## 2014-09-09 MED ORDER — PAROXETINE HCL ER 37.5 MG PO TB24: 37.5000 mg | ORAL_TABLET | Freq: Every day | ORAL | Status: DC

## 2014-09-09 NOTE — Progress Notes (Signed)
[facility] [id number] Progress Note  Allison [name] [id number] 55 y.o.  09/09/2014 10:13 AM  Chief Complaint:  Medication management and followup   History of Present Illness: Allison [name] came for her followup appointment.  She is compliant with her Wellbutrin and Paxil.  She denies any side effects.  Her depression and anxiety is under control.  She is looking forward for spring break .  She has no plan to go out of town and she liked to enjoy and stay at home.  Recently she's seen her primary care physician and she was given Lyrica but she is concerned because of the weight gain.  Overall her appetite is okay.  Her vitals are stable.  Patient denies any agitation, anger, mood swing or any irritability.  She sleeping good.  She denies any feeling of hopelessness or worthlessness.  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  She lives with her husband who is very supportive.  Her primary care physician is Dr. Gilman Buttner at [facility].      Suicidal Ideation: No Plan Formed: No Patient has means to carry out plan: No  Homicidal Ideation: No Plan Formed: No Patient has means to carry out plan: No  Review of Systems: Psychiatric: Agitation: No Hallucination: No Depressed Mood: No Insomnia: No Hypersomnia: No Altered Concentration: No Feels Worthless: No Grandiose Ideas: No Belief In Special Powers: No New/Increased Substance Abuse: No Compulsions: No  Neurologic: Headache: No Seizure: No Paresthesias: No  Past Medical Family, Social History:  Patient was born and raised in [place].  She has one 33 year old daughter who lives in [place].  She lives with her husband.  She's been married for more than 35 years.  She is working as a Midwife.  Patient has history of hyperlipidemia, diabetes mellitus, hypertension, obesity and coronary artery disease.  Her primary care physician is Dr.Clara Katrinka Blazing at [facility].  Outpatient Encounter Prescriptions as of 09/09/2014  Medication Sig  . acetaminophen (TYLENOL) 500 MG tablet Take 500 mg by mouth as needed.    Marland Kitchen aspirin 325 MG tablet Take 325 mg by mouth daily.    Marland Kitchen atorvastatin (LIPITOR) 10 MG tablet Take 10 mg by mouth daily.  Marland Kitchen buPROPion (WELLBUTRIN XL) 150 MG 24 hr tablet Take 1 tablet (150 mg total) by mouth daily.  . Choline Fenofibrate (TRILIPIX) 135 MG capsule Take 135 mg by mouth daily.    . clopidogrel (PLAVIX) 75 MG tablet Take 75 mg by mouth daily.    . fish oil-omega-3 fatty acids 1000 MG capsule Take 1 capsule by mouth daily.    Marland Kitchen HYDROcodone-acetaminophen (NORCO) 10-325 MG per tablet   . insulin glargine (LANTUS) 100 UNIT/ML injection Inject 55 Units into the skin at bedtime.    . insulin glulisine (APIDRA) 100 UNIT/ML injection Inject into the skin.    Marland Kitchen lisinopril-hydrochlorothiazide (PRINZIDE,ZESTORETIC) 20-25 MG per tablet Take 1 tablet by mouth daily.    . nitroGLYCERIN (NITROSTAT) 0.4 MG SL tablet Place 0.4 mg under the tongue every 5 (five) minutes as needed. May repeat for up to 3 doses.   . Olmesartan-Amlodipine-HCTZ (TRIBENZOR) 40-5-25 MG TABS Take by mouth.  Marland Kitchen PARoxetine (PAXIL-CR) 37.5 MG 24 hr tablet Take 1 tablet (37.5 mg total) by mouth daily.  . sitaGLIPtin (JANUVIA) 100 MG tablet Take 100 mg by mouth daily.    . [DISCONTINUED] buPROPion (WELLBUTRIN XL) 150 MG 24 hr tablet Take 1  tablet (150 mg total) by mouth daily.  . [DISCONTINUED] PARoxetine (PAXIL-CR) 37.5 MG 24 hr tablet Take 1 tablet (37.5 mg total) by mouth daily.    Past Psychiatric History/Hospitalization(s): Patient denies any history of suicidal and or any inpatient psychiatric treatment.  She admitted history of mood swings, anger and irritability.  She is seen in this office since 2003.  She was given Risperdal however it was discontinued because patient was feeling better. Anxiety: Yes Bipolar Disorder: Yes Depression: Yes Mania:  Yes Psychosis: No Schizophrenia: No Personality Disorder: No Hospitalization for psychiatric illness: No History of Electroconvulsive Shock Therapy: No Prior Suicide Attempts: No  Physical Exam: Constitutional:  BP 164/79 mmHg  Pulse 88  Ht 5\' 2"  (1.575 m)  Wt 282 lb (127.914 kg)  BMI 51.57 kg/m2  General Appearance: well nourished and obese  Musculoskeletal: Strength & Muscle Tone: within normal limits Gait & Station: normal Patient leans: N/A  Psychiatric: Speech (describe rate, volume, coherence, spontaneity, and abnormalities if any): Clear and coherent with normal tone and volume.    Thought Process (describe rate, content, abstract reasoning, and computation): Logical and goal directed.  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  150 mg daily.  Discussed medication side effects and benefits.  Recommended to call us back if she has any question, concern or if she feel worsening of the symptom.  I will see her again in 3 months.  [name], MD 09/09/2014

## 2014-12-07 ENCOUNTER — Inpatient Hospital Stay (HOSPITAL_COMMUNITY)
Admission: EM | Admit: 2014-12-07 | Discharge: 2014-12-25 | DRG: 853 | Disposition: A | Discharge status: Skilled Nursing Facility | Payer: BC Managed Care – PPO | Attending: General Surgery | Admitting: General Surgery

## 2014-12-07 ENCOUNTER — Encounter (HOSPITAL_COMMUNITY): Payer: Self-pay

## 2014-12-07 DIAGNOSIS — Z882 Allergy status to sulfonamides status: Secondary | ICD-10-CM

## 2014-12-07 DIAGNOSIS — E876 Hypokalemia: Secondary | ICD-10-CM | POA: Diagnosis not present

## 2014-12-07 DIAGNOSIS — J9600 Acute respiratory failure, unspecified whether with hypoxia or hypercapnia: Secondary | ICD-10-CM

## 2014-12-07 DIAGNOSIS — Z87891 Personal history of nicotine dependence: Secondary | ICD-10-CM

## 2014-12-07 DIAGNOSIS — N189 Chronic kidney disease, unspecified: Secondary | ICD-10-CM | POA: Diagnosis present

## 2014-12-07 DIAGNOSIS — Z7982 Long term (current) use of aspirin: Secondary | ICD-10-CM

## 2014-12-07 DIAGNOSIS — K37 Unspecified appendicitis: Secondary | ICD-10-CM

## 2014-12-07 DIAGNOSIS — Z8614 Personal history of Methicillin resistant Staphylococcus aureus infection: Secondary | ICD-10-CM

## 2014-12-07 DIAGNOSIS — E1122 Type 2 diabetes mellitus with diabetic chronic kidney disease: Secondary | ICD-10-CM | POA: Diagnosis present

## 2014-12-07 DIAGNOSIS — Z7902 Long term (current) use of antithrombotics/antiplatelets: Secondary | ICD-10-CM

## 2014-12-07 DIAGNOSIS — E872 Acidosis: Secondary | ICD-10-CM | POA: Diagnosis present

## 2014-12-07 DIAGNOSIS — N179 Acute kidney failure, unspecified: Secondary | ICD-10-CM | POA: Diagnosis present

## 2014-12-07 DIAGNOSIS — R6521 Severe sepsis with septic shock: Secondary | ICD-10-CM | POA: Diagnosis present

## 2014-12-07 DIAGNOSIS — Z79899 Other long term (current) drug therapy: Secondary | ICD-10-CM

## 2014-12-07 DIAGNOSIS — Z880 Allergy status to penicillin: Secondary | ICD-10-CM

## 2014-12-07 DIAGNOSIS — E1165 Type 2 diabetes mellitus with hyperglycemia: Secondary | ICD-10-CM | POA: Diagnosis not present

## 2014-12-07 DIAGNOSIS — J95821 Acute postprocedural respiratory failure: Secondary | ICD-10-CM | POA: Diagnosis not present

## 2014-12-07 DIAGNOSIS — IMO0002 Reserved for concepts with insufficient information to code with codable children: Secondary | ICD-10-CM | POA: Diagnosis present

## 2014-12-07 DIAGNOSIS — E1129 Type 2 diabetes mellitus with other diabetic kidney complication: Secondary | ICD-10-CM | POA: Diagnosis present

## 2014-12-07 DIAGNOSIS — F319 Bipolar disorder, unspecified: Secondary | ICD-10-CM | POA: Diagnosis present

## 2014-12-07 DIAGNOSIS — A419 Sepsis, unspecified organism: Principal | ICD-10-CM | POA: Diagnosis present

## 2014-12-07 DIAGNOSIS — E860 Dehydration: Secondary | ICD-10-CM | POA: Diagnosis present

## 2014-12-07 DIAGNOSIS — I2510 Atherosclerotic heart disease of native coronary artery without angina pectoris: Secondary | ICD-10-CM | POA: Diagnosis present

## 2014-12-07 DIAGNOSIS — E871 Hypo-osmolality and hyponatremia: Secondary | ICD-10-CM | POA: Diagnosis present

## 2014-12-07 DIAGNOSIS — K352 Acute appendicitis with generalized peritonitis: Secondary | ICD-10-CM | POA: Diagnosis present

## 2014-12-07 DIAGNOSIS — Z886 Allergy status to analgesic agent status: Secondary | ICD-10-CM

## 2014-12-07 DIAGNOSIS — K3532 Acute appendicitis with perforation and localized peritonitis, without abscess: Secondary | ICD-10-CM

## 2014-12-07 DIAGNOSIS — G9340 Encephalopathy, unspecified: Secondary | ICD-10-CM | POA: Diagnosis present

## 2014-12-07 DIAGNOSIS — Z978 Presence of other specified devices: Secondary | ICD-10-CM

## 2014-12-07 DIAGNOSIS — Z955 Presence of coronary angioplasty implant and graft: Secondary | ICD-10-CM

## 2014-12-07 DIAGNOSIS — Z79891 Long term (current) use of opiate analgesic: Secondary | ICD-10-CM

## 2014-12-07 DIAGNOSIS — Z794 Long term (current) use of insulin: Secondary | ICD-10-CM

## 2014-12-07 DIAGNOSIS — Z452 Encounter for adjustment and management of vascular access device: Secondary | ICD-10-CM

## 2014-12-07 DIAGNOSIS — R109 Unspecified abdominal pain: Secondary | ICD-10-CM

## 2014-12-07 DIAGNOSIS — I252 Old myocardial infarction: Secondary | ICD-10-CM

## 2014-12-07 DIAGNOSIS — Z91040 Latex allergy status: Secondary | ICD-10-CM

## 2014-12-07 DIAGNOSIS — E785 Hyperlipidemia, unspecified: Secondary | ICD-10-CM | POA: Diagnosis present

## 2014-12-07 DIAGNOSIS — Z791 Long term (current) use of non-steroidal anti-inflammatories (NSAID): Secondary | ICD-10-CM

## 2014-12-07 DIAGNOSIS — K559 Vascular disorder of intestine, unspecified: Secondary | ICD-10-CM | POA: Diagnosis present

## 2014-12-07 DIAGNOSIS — Z6841 Body Mass Index (BMI) 40.0 and over, adult: Secondary | ICD-10-CM

## 2014-12-07 DIAGNOSIS — D649 Anemia, unspecified: Secondary | ICD-10-CM | POA: Diagnosis present

## 2014-12-07 DIAGNOSIS — L8990 Pressure ulcer of unspecified site, unspecified stage: Secondary | ICD-10-CM

## 2014-12-07 HISTORY — DX: Major depressive disorder, single episode, unspecified: F32.9

## 2014-12-07 HISTORY — DX: Bipolar disorder, unspecified: F31.9

## 2014-12-07 LAB — COMPREHENSIVE METABOLIC PANEL
ALBUMIN: 2.9 g/dL — AB (ref 3.5–5.0)
ALK PHOS: 65 U/L (ref 38–126)
ALT: 18 U/L (ref 14–54)
ANION GAP: 14 (ref 5–15)
AST: 22 U/L (ref 15–41)
BILIRUBIN TOTAL: 0.8 mg/dL (ref 0.3–1.2)
BUN: 41 mg/dL — AB (ref 6–20)
CHLORIDE: 96 mmol/L — AB (ref 101–111)
CO2: 19 mmol/L — ABNORMAL LOW (ref 22–32)
CREATININE: 2.33 mg/dL — AB (ref 0.44–1.00)
Calcium: 8.3 mg/dL — ABNORMAL LOW (ref 8.9–10.3)
GFR calc non Af Amer: 23 mL/min — ABNORMAL LOW (ref >60)
GFR, EST AFRICAN AMERICAN: 26 mL/min — AB (ref >60)
Glucose, Bld: 183 mg/dL — ABNORMAL HIGH (ref 65–99)
Potassium: 2.9 mmol/L — ABNORMAL LOW (ref 3.5–5.1)
Sodium: 129 mmol/L — ABNORMAL LOW (ref 135–145)
TOTAL PROTEIN: 6.8 g/dL (ref 6.5–8.1)

## 2014-12-07 LAB — CBC WITH DIFFERENTIAL/PLATELET
Basophils Absolute: 0 10*3/uL (ref 0.0–0.1)
Basophils Relative: 0 % (ref 0–1)
Eosinophils Absolute: 0 10*3/uL (ref 0.0–0.7)
Eosinophils Relative: 0 % (ref 0–5)
HCT: 39.5 % (ref 36.0–46.0)
HEMOGLOBIN: 13.8 g/dL (ref 12.0–15.0)
LYMPHS ABS: 1 10*3/uL (ref 0.7–4.0)
LYMPHS PCT: 7 % — AB (ref 12–46)
MCH: 30.7 pg (ref 26.0–34.0)
MCHC: 34.9 g/dL (ref 30.0–36.0)
MCV: 87.8 fL (ref 78.0–100.0)
Monocytes Absolute: 0.7 10*3/uL (ref 0.1–1.0)
Monocytes Relative: 4 % (ref 3–12)
NEUTROS PCT: 89 % — AB (ref 43–77)
Neutro Abs: 13.7 10*3/uL — ABNORMAL HIGH (ref 1.7–7.7)
Platelets: 278 10*3/uL (ref 150–400)
RBC: 4.5 MIL/uL (ref 3.87–5.11)
RDW: 13.6 % (ref 11.5–15.5)
WBC: 15.4 10*3/uL — ABNORMAL HIGH (ref 4.0–10.5)

## 2014-12-07 LAB — LIPASE, BLOOD: Lipase: <10 U/L — ABNORMAL LOW (ref 22–51)

## 2014-12-07 MED ORDER — SODIUM CHLORIDE 0.9 % IV BOLUS (SEPSIS)
1000.0000 mL | Freq: Once | INTRAVENOUS | Status: AC
  Administered 2014-12-08: 1000 mL via INTRAVENOUS

## 2014-12-07 MED ORDER — MORPHINE SULFATE 4 MG/ML IJ SOLN
4.0000 mg | Freq: Once | INTRAMUSCULAR | Status: AC
  Administered 2014-12-08: 4 mg via INTRAVENOUS
  Filled 2014-12-07: qty 1

## 2014-12-07 MED ORDER — SODIUM CHLORIDE 0.9 % IV BOLUS (SEPSIS)
2000.0000 mL | Freq: Once | INTRAVENOUS | Status: AC
  Administered 2014-12-08: 2000 mL via INTRAVENOUS

## 2014-12-07 MED ORDER — ONDANSETRON HCL 4 MG/2ML IJ SOLN
4.0000 mg | Freq: Once | INTRAMUSCULAR | Status: AC
  Administered 2014-12-08: 4 mg via INTRAVENOUS
  Filled 2014-12-07: qty 2

## 2014-12-07 NOTE — ED Provider Notes (Signed)
CSN: [id number]     Arrival date & time 12/07/14  1906 History   First MD Initiated Contact with Patient 12/07/14 2319     Chief Complaint  Patient presents with  . Abdominal Pain     (Consider location/radiation/quality/duration/timing/severity/associated sxs/prior Treatment) HPI Pt is a 54yo female with hx of NSTEMI, hypotension, hypokalemia, and  IDDM Type II, presenting to ED with c/o diffuse abdominal pain that is sharp and shooting, 10/10, worse in lower abdomen. Pt reports dysuria, n/v/d. Symptoms started 3 days ago. States she has had 10 episodes of watery yellow diarrhea today. Reports hot and cold chills including breaking out into sweats.  Pt states sweats started when it started to get hot out.  Denies chest pain or SOB. Denies back pain.  Denies recent travel or sick contacts. Denies recent use of antibiotics.  Pt is on over a dozen of medications but denies any new medications or recent dose change. Denies hx of abdominal surgery, however, medical records indicate she had an endometrial ablation.     Past Medical History  Diagnosis Date  . Non-ST elevated myocardial infarction (non-STEMI)   . Dysphasia   . Hypotension   . Hypokalemia   . Non-insulin dependent diabetes mellitus   . Hyperlipidemia   . Migraine headache   . Hemorrhoids   . History of MRSA infection    Past Surgical History  Procedure Laterality Date  . Knee arthroscopy  1996    Right  . Endometrial ablation  2009    Laser  . Esophageal dilation  2006  . Cardiac catheterization  01/03/2010   Family History  Problem Relation Age of Onset  . Bipolar disorder Sister    History  Substance Use Topics  . Smoking status: Former Games developer  . Smokeless tobacco: Never Used  . Alcohol Use: No   OB History    No data available     Review of Systems  Constitutional: Positive for fever, chills, diaphoresis, appetite change and fatigue.  Respiratory: Negative for cough and shortness of breath.    Gastrointestinal: Positive for nausea, vomiting, abdominal pain (lower abdomen) and diarrhea.  Genitourinary: Positive for dysuria and hematuria. Negative for frequency and flank pain.  Musculoskeletal: Negative for myalgias and back pain.  All other systems reviewed and are negative.     Allergies  Aspirin; Latex; Penicillins; and Sulfonamide derivatives  Home Medications   Prior to Admission medications   Medication Sig Start Date End Date Taking? Authorizing Provider  acetaminophen (TYLENOL) 500 MG tablet Take 500 mg by mouth as needed.      Historical Provider, MD  aspirin 325 MG tablet Take 325 mg by mouth daily.      Historical Provider, MD  atorvastatin (LIPITOR) 10 MG tablet Take 10 mg by mouth daily.    Historical Provider, MD  buPROPion (WELLBUTRIN XL) 150 MG 24 hr tablet Take 1 tablet (150 mg total) by mouth daily. 09/09/14   Cleotis Nipper, MD  Choline Fenofibrate (TRILIPIX) 135 MG capsule Take 135 mg by mouth daily.      Historical Provider, MD  clopidogrel (PLAVIX) 75 MG tablet Take 75 mg by mouth daily.      Historical Provider, MD  fish oil-omega-3 fatty acids 1000 MG capsule Take 1 capsule by mouth daily.      Historical Provider, MD  HYDROcodone-acetaminophen [facility]) 10-325 MG per tablet  05/18/14   Historical Provider, MD  insulin glargine (LANTUS) 100 UNIT/ML injection Inject 55 Units into the skin at  bedtime.      Historical Provider, MD  insulin glulisine (APIDRA) 100 UNIT/ML injection Inject into the skin.      Historical Provider, MD  lisinopril-hydrochlorothiazide (PRINZIDE,ZESTORETIC) 20-25 MG per tablet Take 1 tablet by mouth daily.      Historical Provider, MD  nitroGLYCERIN (NITROSTAT) 0.4 MG SL tablet Place 0.4 mg under the tongue every 5 (five) minutes as needed. May repeat for up to 3 doses.     Historical Provider, MD  Olmesartan-Amlodipine-HCTZ (TRIBENZOR) 40-5-25 MG TABS Take by mouth.    Historical Provider, MD  PARoxetine (PAXIL-CR) 37.5 MG 24 hr  tablet Take 1 tablet (37.5 mg total) by mouth daily. 09/09/14   Cleotis Nipper, MD  sitaGLIPtin (JANUVIA) 100 MG tablet Take 100 mg by mouth daily.      Historical Provider, MD   BP 95/52 mmHg  Pulse 69  Temp(Src) 98.3 F (36.8 C) (Oral)  Resp 20  Ht 5\' 3"  (1.6 m)  Wt 280 lb 12.8 oz (127.37 kg)  BMI 49.75 kg/m2  SpO2 94% Physical Exam  Constitutional: She appears well-developed and well-nourished. She appears distressed.  HENT:  Head: Normocephalic and atraumatic.  Eyes: Conjunctivae are normal. No scleral icterus.  Neck: Normal range of motion. Neck supple.  Cardiovascular: Normal rate, regular rhythm and normal heart sounds.   Pulmonary/Chest: Effort normal and breath sounds normal. No respiratory distress. She has no wheezes. She has no rales. She exhibits no tenderness.  Abdominal: Soft. Bowel sounds are normal. She exhibits no distension and no mass. There is tenderness. There is guarding. There is no rebound.  Obese abdomen, diffuse tenderness worse in lower abdomen, guarding to RLQ. No rebound or masses palpated.  Musculoskeletal: Normal range of motion.  Neurological: She is alert.  Skin: Skin is warm. No rash noted. She is diaphoretic ( profusely). No erythema.  Psychiatric: Her behavior is normal. Her affect is inappropriate ( laughing at inappropriate times). Her speech is rapid and/or pressured.  Nursing note and vitals reviewed.   ED Course  Procedures (including critical care time) Labs Review Labs Reviewed  CBC WITH DIFFERENTIAL/PLATELET - Abnormal; Notable for the following:    WBC 15.4 (*)    Neutrophils Relative % 89 (*)    Neutro Abs 13.7 (*)    Lymphocytes Relative 7 (*)    All other components within normal limits  COMPREHENSIVE METABOLIC PANEL - Abnormal; Notable for the following:    Sodium 129 (*)    Potassium 2.9 (*)    Chloride 96 (*)    CO2 19 (*)    Glucose, Bld 183 (*)    BUN 41 (*)    Creatinine, Ser 2.33 (*)    Calcium 8.3 (*)    Albumin  2.9 (*)    GFR calc non Af Amer 23 (*)    GFR calc Af Amer 26 (*)    All other components within normal limits  LIPASE, BLOOD - Abnormal; Notable for the following:    Lipase <10 (*)    All other components within normal limits  URINALYSIS, ROUTINE W REFLEX MICROSCOPIC (NOT AT [facility])  I-STAT CG4 LACTIC ACID, ED    Imaging Review Ct Abdomen Pelvis Wo Contrast  12/08/2014   CLINICAL DATA:  Severe bilateral lower quadrant pain. Nausea, vomiting, diarrhea, and dysuria.  EXAM: CT ABDOMEN AND PELVIS WITHOUT CONTRAST  TECHNIQUE: Multidetector CT imaging of the abdomen and pelvis was performed following the standard protocol without IV contrast.  COMPARISON:  None.  FINDINGS: Atelectasis  in the lung bases.  The unenhanced appearance of the liver, spleen, gallbladder, pancreas, adrenal glands, kidneys, abdominal aorta, and inferior vena cava are unremarkable. Small accessory spleen. Stomach is unremarkable. No free air or free fluid in the abdomen.  Pelvis: There is an inflammatory process in the anterior pelvis with edema in the mesentery and pelvic fat, small amount of free fluid along the pericolic gutters and in the deep pelvis, small bowel wall thickening, and proximal small bowel dilatation. There is an at appendicolith present. With the appendix is distended and thick walled. Most likely this represents acute appendicitis with perforation and mesenteric infiltration causing reactive bowel edema an proximal obstruction of small bowel. Crohn's disease would be another possibility. No free air is suggested. Uterus and ovaries are not enlarged. Iliac artery stent is present. Degenerative changes in the spine.  IMPRESSION: Inflammatory process demonstrated throughout the pelvis with fluid and infiltration in the low pelvis and mesenteric. At appendicolith with abnormal appearing appendix, likely this represents perforated appendicitis. No discrete abscess is identified. There is small bowel wall thickening of  the pelvic loops with proximal small bowel obstruction.  These results were called by telephone at the time of interpretation on 12/08/2014 at 2:51 am to PA [facility] , who verbally acknowledged these results.   Electronically Signed   By: Burman Nieves M.D.   On: 12/08/2014 02:53     EKG Interpretation None      MDM   Final diagnoses:  Perforated appendicitis    Pt is a 54yo female c/o lower abdominal pain, n/v/d that started 3 days ago.  Pt reports 10 episodes diarrhea today. Afebrile but profusely diaphoretic.  Tender across lower abdomen, worse in RLQ. Pt does seem to laugh at inapropriate times, hx of bipolar disorder. Labs: CBC c/w WBC of 15.4,  CMP concerning for dehydration with ARF, Cr of 2.33, hyponatremic and hypokalemic.  CT abd: suggestive of perforated appendicitis w/o discrete abscess.  Discussed pt with Dr. Norlene Campbell, will consult with general surgery.  IV zosyn started.   3:52 AM Consulted with Dr. Luisa Hart, general surgery, agreed to see pt. Pt admitted by [facility].    Junius Finner, PA-C 12/08/14 4098  Marisa Severin, MD 12/08/14 [id number]

## 2014-12-07 NOTE — ED Notes (Signed)
Onset 12-05-14 lower abdominal pain, dysuria, fever, vomiting, diarrhea.  No vomiting since yesterday.  Diarrhea x 10 today, watery, yellow.

## 2014-12-08 ENCOUNTER — Encounter (HOSPITAL_COMMUNITY): Payer: Self-pay

## 2014-12-08 ENCOUNTER — Emergency Department (HOSPITAL_COMMUNITY): Payer: BC Managed Care – PPO

## 2014-12-08 DIAGNOSIS — Z91040 Latex allergy status: Secondary | ICD-10-CM | POA: Diagnosis not present

## 2014-12-08 DIAGNOSIS — E871 Hypo-osmolality and hyponatremia: Secondary | ICD-10-CM | POA: Diagnosis present

## 2014-12-08 DIAGNOSIS — K659 Peritonitis, unspecified: Secondary | ICD-10-CM | POA: Diagnosis not present

## 2014-12-08 DIAGNOSIS — N179 Acute kidney failure, unspecified: Secondary | ICD-10-CM | POA: Diagnosis present

## 2014-12-08 DIAGNOSIS — E872 Acidosis: Secondary | ICD-10-CM | POA: Diagnosis present

## 2014-12-08 DIAGNOSIS — K559 Vascular disorder of intestine, unspecified: Secondary | ICD-10-CM | POA: Diagnosis present

## 2014-12-08 DIAGNOSIS — E876 Hypokalemia: Secondary | ICD-10-CM | POA: Diagnosis not present

## 2014-12-08 DIAGNOSIS — Z886 Allergy status to analgesic agent status: Secondary | ICD-10-CM | POA: Diagnosis not present

## 2014-12-08 DIAGNOSIS — Z791 Long term (current) use of non-steroidal anti-inflammatories (NSAID): Secondary | ICD-10-CM | POA: Diagnosis not present

## 2014-12-08 DIAGNOSIS — J95821 Acute postprocedural respiratory failure: Secondary | ICD-10-CM | POA: Diagnosis not present

## 2014-12-08 DIAGNOSIS — Z7902 Long term (current) use of antithrombotics/antiplatelets: Secondary | ICD-10-CM | POA: Diagnosis not present

## 2014-12-08 DIAGNOSIS — I252 Old myocardial infarction: Secondary | ICD-10-CM | POA: Diagnosis not present

## 2014-12-08 DIAGNOSIS — R6520 Severe sepsis without septic shock: Secondary | ICD-10-CM | POA: Diagnosis not present

## 2014-12-08 DIAGNOSIS — A419 Sepsis, unspecified organism: Secondary | ICD-10-CM | POA: Diagnosis present

## 2014-12-08 DIAGNOSIS — Z87891 Personal history of nicotine dependence: Secondary | ICD-10-CM | POA: Diagnosis not present

## 2014-12-08 DIAGNOSIS — Z955 Presence of coronary angioplasty implant and graft: Secondary | ICD-10-CM | POA: Diagnosis not present

## 2014-12-08 DIAGNOSIS — E119 Type 2 diabetes mellitus without complications: Secondary | ICD-10-CM

## 2014-12-08 DIAGNOSIS — N170 Acute kidney failure with tubular necrosis: Secondary | ICD-10-CM | POA: Diagnosis not present

## 2014-12-08 DIAGNOSIS — F319 Bipolar disorder, unspecified: Secondary | ICD-10-CM | POA: Diagnosis present

## 2014-12-08 DIAGNOSIS — K352 Acute appendicitis with generalized peritonitis: Secondary | ICD-10-CM | POA: Diagnosis present

## 2014-12-08 DIAGNOSIS — E1165 Type 2 diabetes mellitus with hyperglycemia: Secondary | ICD-10-CM | POA: Diagnosis not present

## 2014-12-08 DIAGNOSIS — D649 Anemia, unspecified: Secondary | ICD-10-CM | POA: Diagnosis present

## 2014-12-08 DIAGNOSIS — Z880 Allergy status to penicillin: Secondary | ICD-10-CM | POA: Diagnosis not present

## 2014-12-08 DIAGNOSIS — Z794 Long term (current) use of insulin: Secondary | ICD-10-CM

## 2014-12-08 DIAGNOSIS — I2510 Atherosclerotic heart disease of native coronary artery without angina pectoris: Secondary | ICD-10-CM | POA: Diagnosis present

## 2014-12-08 DIAGNOSIS — E1129 Type 2 diabetes mellitus with other diabetic kidney complication: Secondary | ICD-10-CM | POA: Diagnosis not present

## 2014-12-08 DIAGNOSIS — E1122 Type 2 diabetes mellitus with diabetic chronic kidney disease: Secondary | ICD-10-CM | POA: Diagnosis present

## 2014-12-08 DIAGNOSIS — E860 Dehydration: Secondary | ICD-10-CM | POA: Diagnosis present

## 2014-12-08 DIAGNOSIS — N189 Chronic kidney disease, unspecified: Secondary | ICD-10-CM | POA: Diagnosis present

## 2014-12-08 DIAGNOSIS — E785 Hyperlipidemia, unspecified: Secondary | ICD-10-CM | POA: Diagnosis present

## 2014-12-08 DIAGNOSIS — Z79891 Long term (current) use of opiate analgesic: Secondary | ICD-10-CM | POA: Diagnosis not present

## 2014-12-08 DIAGNOSIS — Z79899 Other long term (current) drug therapy: Secondary | ICD-10-CM | POA: Diagnosis not present

## 2014-12-08 DIAGNOSIS — R6521 Severe sepsis with septic shock: Secondary | ICD-10-CM | POA: Diagnosis present

## 2014-12-08 DIAGNOSIS — Z8614 Personal history of Methicillin resistant Staphylococcus aureus infection: Secondary | ICD-10-CM | POA: Diagnosis not present

## 2014-12-08 DIAGNOSIS — Z882 Allergy status to sulfonamides status: Secondary | ICD-10-CM | POA: Diagnosis not present

## 2014-12-08 DIAGNOSIS — L8990 Pressure ulcer of unspecified site, unspecified stage: Secondary | ICD-10-CM | POA: Diagnosis not present

## 2014-12-08 DIAGNOSIS — Z6841 Body Mass Index (BMI) 40.0 and over, adult: Secondary | ICD-10-CM | POA: Diagnosis not present

## 2014-12-08 DIAGNOSIS — Z7982 Long term (current) use of aspirin: Secondary | ICD-10-CM | POA: Diagnosis not present

## 2014-12-08 DIAGNOSIS — Z9889 Other specified postprocedural states: Secondary | ICD-10-CM | POA: Diagnosis not present

## 2014-12-08 DIAGNOSIS — J9601 Acute respiratory failure with hypoxia: Secondary | ICD-10-CM | POA: Diagnosis not present

## 2014-12-08 DIAGNOSIS — G9340 Encephalopathy, unspecified: Secondary | ICD-10-CM | POA: Diagnosis present

## 2014-12-08 LAB — BASIC METABOLIC PANEL
Anion gap: 7 (ref 5–15)
BUN: 45 mg/dL — AB (ref 6–20)
CALCIUM: 6.7 mg/dL — AB (ref 8.9–10.3)
CO2: 21 mmol/L — ABNORMAL LOW (ref 22–32)
CREATININE: 2.66 mg/dL — AB (ref 0.44–1.00)
Chloride: 103 mmol/L (ref 101–111)
GFR, EST AFRICAN AMERICAN: 22 mL/min — AB (ref >60)
GFR, EST NON AFRICAN AMERICAN: 19 mL/min — AB (ref >60)
GLUCOSE: 174 mg/dL — AB (ref 65–99)
Potassium: 3 mmol/L — ABNORMAL LOW (ref 3.5–5.1)
Sodium: 131 mmol/L — ABNORMAL LOW (ref 135–145)

## 2014-12-08 LAB — CBC
HEMATOCRIT: 33.1 % — AB (ref 36.0–46.0)
Hemoglobin: 11.6 g/dL — ABNORMAL LOW (ref 12.0–15.0)
MCH: 30.4 pg (ref 26.0–34.0)
MCHC: 35 g/dL (ref 30.0–36.0)
MCV: 86.9 fL (ref 78.0–100.0)
Platelets: 233 10*3/uL (ref 150–400)
RBC: 3.81 MIL/uL — ABNORMAL LOW (ref 3.87–5.11)
RDW: 13.7 % (ref 11.5–15.5)
WBC: 13.8 10*3/uL — ABNORMAL HIGH (ref 4.0–10.5)

## 2014-12-08 LAB — GLUCOSE, CAPILLARY
GLUCOSE-CAPILLARY: 156 mg/dL — AB (ref 65–99)
GLUCOSE-CAPILLARY: 109 mg/dL — AB (ref 65–99)
Glucose-Capillary: 155 mg/dL — ABNORMAL HIGH (ref 65–99)
Glucose-Capillary: 165 mg/dL — ABNORMAL HIGH (ref 65–99)
Glucose-Capillary: 140 mg/dL — ABNORMAL HIGH (ref 65–99)
Glucose-Capillary: 113 mg/dL — ABNORMAL HIGH (ref 65–99)

## 2014-12-08 LAB — URINALYSIS, ROUTINE W REFLEX MICROSCOPIC
Glucose, UA: NEGATIVE mg/dL
KETONES UR: NEGATIVE mg/dL
NITRITE: NEGATIVE
Protein, ur: 30 mg/dL — AB
Specific Gravity, Urine: 1.017 (ref 1.005–1.030)
UROBILINOGEN UA: 0.2 mg/dL (ref 0.0–1.0)
pH: 5 (ref 5.0–8.0)

## 2014-12-08 LAB — COMPREHENSIVE METABOLIC PANEL
ALT: 16 U/L (ref 14–54)
ANION GAP: 11 (ref 5–15)
AST: 22 U/L (ref 15–41)
Albumin: 2.1 g/dL — ABNORMAL LOW (ref 3.5–5.0)
Alkaline Phosphatase: 54 U/L (ref 38–126)
BILIRUBIN TOTAL: 0.5 mg/dL (ref 0.3–1.2)
BUN: 45 mg/dL — ABNORMAL HIGH (ref 6–20)
CHLORIDE: 103 mmol/L (ref 101–111)
CO2: 17 mmol/L — AB (ref 22–32)
CREATININE: 2.83 mg/dL — AB (ref 0.44–1.00)
Calcium: 7 mg/dL — ABNORMAL LOW (ref 8.9–10.3)
GFR calc Af Amer: 21 mL/min — ABNORMAL LOW (ref >60)
GFR, EST NON AFRICAN AMERICAN: 18 mL/min — AB (ref >60)
Glucose, Bld: 150 mg/dL — ABNORMAL HIGH (ref 65–99)
Potassium: 3.8 mmol/L (ref 3.5–5.1)
Sodium: 131 mmol/L — ABNORMAL LOW (ref 135–145)
Total Protein: 5.3 g/dL — ABNORMAL LOW (ref 6.5–8.1)

## 2014-12-08 LAB — TROPONIN I
Troponin I: 0.06 ng/mL — ABNORMAL HIGH (ref <0.031)
Troponin I: <0.03 ng/mL (ref <0.031)
Troponin I: <0.03 ng/mL (ref <0.031)

## 2014-12-08 LAB — URINE MICROSCOPIC-ADD ON

## 2014-12-08 LAB — CBG MONITORING, ED: GLUCOSE-CAPILLARY: 168 mg/dL — AB (ref 65–99)

## 2014-12-08 LAB — I-STAT CG4 LACTIC ACID, ED: Lactic Acid, Venous: 1.15 mmol/L (ref 0.5–2.0)

## 2014-12-08 MED ORDER — SODIUM CHLORIDE 0.9 % IV SOLN
INTRAVENOUS | Status: DC
  Filled 2014-12-08: qty 2.5

## 2014-12-08 MED ORDER — INSULIN ASPART 100 UNIT/ML ~~LOC~~ SOLN
0.0000 [IU] | SUBCUTANEOUS | Status: DC
  Administered 2014-12-08: 1 [IU] via SUBCUTANEOUS
  Administered 2014-12-09: 2 [IU] via SUBCUTANEOUS
  Administered 2014-12-09: 1 [IU] via SUBCUTANEOUS
  Administered 2014-12-10: 5 [IU] via SUBCUTANEOUS
  Administered 2014-12-10: 3 [IU] via SUBCUTANEOUS
  Administered 2014-12-10: 2 [IU] via SUBCUTANEOUS
  Administered 2014-12-10: 3 [IU] via SUBCUTANEOUS
  Administered 2014-12-10: 2 [IU] via SUBCUTANEOUS

## 2014-12-08 MED ORDER — SODIUM CHLORIDE 0.9 % IV BOLUS (SEPSIS)
500.0000 mL | Freq: Once | INTRAVENOUS | Status: AC
  Administered 2014-12-08: 500 mL via INTRAVENOUS

## 2014-12-08 MED ORDER — SODIUM CHLORIDE 0.9 % IV BOLUS (SEPSIS)
1000.0000 mL | Freq: Once | INTRAVENOUS | Status: AC
  Administered 2014-12-08: 1000 mL via INTRAVENOUS

## 2014-12-08 MED ORDER — HYDROMORPHONE HCL 1 MG/ML IJ SOLN
1.0000 mg | INTRAMUSCULAR | Status: DC | PRN
  Administered 2014-12-08 – 2014-12-10 (×16): 1 mg via INTRAVENOUS
  Filled 2014-12-08 (×16): qty 1

## 2014-12-08 MED ORDER — ENOXAPARIN SODIUM 40 MG/0.4ML ~~LOC~~ SOLN
40.0000 mg | SUBCUTANEOUS | Status: DC
  Administered 2014-12-08 – 2014-12-10 (×3): 40 mg via SUBCUTANEOUS
  Filled 2014-12-08 (×4): qty 0.4

## 2014-12-08 MED ORDER — POTASSIUM CHLORIDE IN NACL 20-0.9 MEQ/L-% IV SOLN
INTRAVENOUS | Status: DC
  Administered 2014-12-08: 08:00:00 via INTRAVENOUS
  Filled 2014-12-08 (×2): qty 1000

## 2014-12-08 MED ORDER — CIPROFLOXACIN IN D5W 400 MG/200ML IV SOLN
400.0000 mg | Freq: Two times a day (BID) | INTRAVENOUS | Status: DC
  Administered 2014-12-08 – 2014-12-09 (×3): 400 mg via INTRAVENOUS
  Filled 2014-12-08 (×4): qty 200

## 2014-12-08 MED ORDER — SODIUM CHLORIDE 0.9 % IV SOLN
INTRAVENOUS | Status: DC
  Administered 2014-12-08: 11:00:00 via INTRAVENOUS

## 2014-12-08 MED ORDER — FENTANYL CITRATE (PF) 100 MCG/2ML IJ SOLN
50.0000 ug | Freq: Once | INTRAMUSCULAR | Status: AC
  Administered 2014-12-08: 50 ug via INTRAVENOUS
  Filled 2014-12-08: qty 2

## 2014-12-08 MED ORDER — DEXTROSE-NACL 5-0.45 % IV SOLN: INTRAVENOUS | Status: DC

## 2014-12-08 MED ORDER — IOHEXOL 300 MG/ML  SOLN: 25.0000 mL | Freq: Once | INTRAMUSCULAR | Status: AC | PRN

## 2014-12-08 MED ORDER — POTASSIUM CHLORIDE 10 MEQ/100ML IV SOLN
10.0000 meq | INTRAVENOUS | Status: AC
  Administered 2014-12-08 (×4): 10 meq via INTRAVENOUS
  Filled 2014-12-08 (×4): qty 100

## 2014-12-08 MED ORDER — POTASSIUM CHLORIDE 10 MEQ/100ML IV SOLN
10.0000 meq | INTRAVENOUS | Status: AC
  Administered 2014-12-08 (×3): 10 meq via INTRAVENOUS
  Filled 2014-12-08 (×3): qty 100

## 2014-12-08 MED ORDER — DEXTROSE 50 % IV SOLN: 25.0000 mL | INTRAVENOUS | Status: DC | PRN

## 2014-12-08 MED ORDER — ACETAMINOPHEN 650 MG RE SUPP
650.0000 mg | Freq: Four times a day (QID) | RECTAL | Status: DC | PRN
Start: 2014-12-08 — End: 2014-12-25
  Administered 2014-12-11 – 2014-12-13 (×2): 650 mg via RECTAL
  Filled 2014-12-08 (×2): qty 1

## 2014-12-08 MED ORDER — ACETAMINOPHEN 325 MG PO TABS: 650.0000 mg | ORAL_TABLET | Freq: Four times a day (QID) | ORAL | Status: DC | PRN

## 2014-12-08 MED ORDER — PIPERACILLIN-TAZOBACTAM 3.375 G IVPB 30 MIN
3.3750 g | Freq: Once | INTRAVENOUS | Status: AC
  Administered 2014-12-08: 3.375 g via INTRAVENOUS
  Filled 2014-12-08: qty 50

## 2014-12-08 MED ORDER — ONDANSETRON HCL 4 MG/2ML IJ SOLN
4.0000 mg | Freq: Four times a day (QID) | INTRAMUSCULAR | Status: DC | PRN
  Administered 2014-12-09 – 2014-12-10 (×2): 4 mg via INTRAVENOUS
  Filled 2014-12-08 (×2): qty 2

## 2014-12-08 MED ORDER — CETYLPYRIDINIUM CHLORIDE 0.05 % MT LIQD
7.0000 mL | Freq: Two times a day (BID) | OROMUCOSAL | Status: DC
  Administered 2014-12-08 – 2014-12-09 (×3): 7 mL via OROMUCOSAL

## 2014-12-08 MED ORDER — SODIUM CHLORIDE 0.9 % IV SOLN
INTRAVENOUS | Status: DC
  Administered 2014-12-08: 15:00:00 via INTRAVENOUS
  Administered 2014-12-08: 150 mL/h via INTRAVENOUS
  Administered 2014-12-08 – 2014-12-09 (×2): via INTRAVENOUS

## 2014-12-08 MED ORDER — METRONIDAZOLE IN NACL 5-0.79 MG/ML-% IV SOLN
500.0000 mg | Freq: Three times a day (TID) | INTRAVENOUS | Status: DC
  Administered 2014-12-08 – 2014-12-09 (×4): 500 mg via INTRAVENOUS
  Filled 2014-12-08 (×5): qty 100

## 2014-12-08 MED ORDER — CHLORHEXIDINE GLUCONATE 0.12 % MT SOLN
15.0000 mL | Freq: Two times a day (BID) | OROMUCOSAL | Status: DC
  Administered 2014-12-08 – 2014-12-10 (×4): 15 mL via OROMUCOSAL
  Filled 2014-12-08 (×4): qty 15

## 2014-12-08 MED ORDER — SODIUM CHLORIDE 0.9 % IJ SOLN
10.0000 mL | INTRAMUSCULAR | Status: DC | PRN
  Administered 2014-12-08: 20 mL
  Administered 2014-12-12 – 2014-12-23 (×2): 10 mL
  Filled 2014-12-08 (×3): qty 40

## 2014-12-08 MED ORDER — SODIUM CHLORIDE 0.9 % IJ SOLN
10.0000 mL | Freq: Two times a day (BID) | INTRAMUSCULAR | Status: DC
  Administered 2014-12-08 – 2014-12-11 (×8): 10 mL
  Administered 2014-12-12: 30 mL
  Administered 2014-12-12 – 2014-12-16 (×8): 10 mL
  Administered 2014-12-17: 20 mL
  Administered 2014-12-17 – 2014-12-24 (×12): 10 mL

## 2014-12-08 NOTE — H&P (Addendum)
Allison [name] is an 54 y.o. female.   Chief Complaint: ABDOMINAL PAIN DIARRHEA HPI:2 DAY HX OF PERIUMBILICAL ABDOMINAL PAIN DIARRHEA   AND SOME NAUSEA AND VOMITING.    Pain started Sunday diffuse periumbilical crampy.  Has worsened and has had diarrhea as well. CT shows pelvic inflammation and probable perforated appendix.   Past Medical History  Diagnosis Date  . Non-ST elevated myocardial infarction (non-STEMI)   . Dysphasia   . Hypotension   . Hypokalemia   . Non-insulin dependent diabetes mellitus   . Hyperlipidemia   . Migraine headache   . Hemorrhoids   . History of MRSA infection     Past Surgical History  Procedure Laterality Date  . Knee arthroscopy  1996    Right  . Endometrial ablation  2009    Laser  . Esophageal dilation  2006  . Cardiac catheterization  01/03/2010    Family History  Problem Relation Age of Onset  . Bipolar disorder Sister    Social History:  reports that she has quit smoking. She has never used smokeless tobacco. She reports that she does not drink alcohol or use illicit drugs.  Allergies:  Allergies  Allergen Reactions  . Aspirin   . Latex   . Penicillins   . Sulfonamide Derivatives      (Not in a hospital admission)  Results for orders placed or performed during the hospital encounter of 12/07/14 (from the past 48 hour(s))  CBC with Differential     Status: Abnormal   Collection Time: 12/07/14  7:30 PM  Result Value Ref Range   WBC 15.4 (H) 4.0 - 10.5 K/uL   RBC 4.50 3.87 - 5.11 MIL/uL   Hemoglobin 13.8 12.0 - 15.0 g/dL   HCT 39.5 36.0 - 46.0 %   MCV 87.8 78.0 - 100.0 fL   MCH 30.7 26.0 - 34.0 pg   MCHC 34.9 30.0 - 36.0 g/dL   RDW 13.6 11.5 - 15.5 %   Platelets 278 150 - 400 K/uL   Neutrophils Relative % 89 (H) 43 - 77 %   Neutro Abs 13.7 (H) 1.7 - 7.7 K/uL   Lymphocytes Relative 7 (L) 12 - 46 %   Lymphs Abs 1.0 0.7 - 4.0 K/uL   Monocytes Relative 4 3 - 12 %   Monocytes Absolute 0.7 0.1 - 1.0 K/uL   Eosinophils  Relative 0 0 - 5 %   Eosinophils Absolute 0.0 0.0 - 0.7 K/uL   Basophils Relative 0 0 - 1 %   Basophils Absolute 0.0 0.0 - 0.1 K/uL  Comprehensive metabolic panel     Status: Abnormal   Collection Time: 12/07/14  7:30 PM  Result Value Ref Range   Sodium 129 (L) 135 - 145 mmol/L   Potassium 2.9 (L) 3.5 - 5.1 mmol/L   Chloride 96 (L) 101 - 111 mmol/L   CO2 19 (L) 22 - 32 mmol/L   Glucose, Bld 183 (H) 65 - 99 mg/dL   BUN 41 (H) 6 - 20 mg/dL   Creatinine, Ser 2.33 (H) 0.44 - 1.00 mg/dL   Calcium 8.3 (L) 8.9 - 10.3 mg/dL   Total Protein 6.8 6.5 - 8.1 g/dL   Albumin 2.9 (L) 3.5 - 5.0 g/dL   AST 22 15 - 41 U/L   ALT 18 14 - 54 U/L   Alkaline Phosphatase 65 38 - 126 U/L   Total Bilirubin 0.8 0.3 - 1.2 mg/dL   GFR calc non Af Amer 23 (L) >60  mL/min   GFR calc Af Amer 26 (L) >60 mL/min    Comment: (NOTE) The eGFR has been calculated using the CKD EPI equation. This calculation has not been validated in all clinical situations. eGFR's persistently <60 mL/min signify possible Chronic Kidney Disease.    Anion gap 14 5 - 15  Lipase, blood     Status: Abnormal   Collection Time: 12/07/14  7:30 PM  Result Value Ref Range   Lipase <10 (L) 22 - 51 U/L    Comment: REPEATED TO VERIFY   Ct Abdomen Pelvis Wo Contrast  12/08/2014   CLINICAL DATA:  Severe bilateral lower quadrant pain. Nausea, vomiting, diarrhea, and dysuria.  EXAM: CT ABDOMEN AND PELVIS WITHOUT CONTRAST  TECHNIQUE: Multidetector CT imaging of the abdomen and pelvis was performed following the standard protocol without IV contrast.  COMPARISON:  None.  FINDINGS: Atelectasis in the lung bases.  The unenhanced appearance of the liver, spleen, gallbladder, pancreas, adrenal glands, kidneys, abdominal aorta, and inferior vena cava are unremarkable. Small accessory spleen. Stomach is unremarkable. No free air or free fluid in the abdomen.  Pelvis: There is an inflammatory process in the anterior pelvis with edema in the mesentery and pelvic  fat, small amount of free fluid along the pericolic gutters and in the deep pelvis, small bowel wall thickening, and proximal small bowel dilatation. There is an at appendicolith present. With the appendix is distended and thick walled. Most likely this represents acute appendicitis with perforation and mesenteric infiltration causing reactive bowel edema an proximal obstruction of small bowel. Crohn's disease would be another possibility. No free air is suggested. Uterus and ovaries are not enlarged. Iliac artery stent is present. Degenerative changes in the spine.  IMPRESSION: Inflammatory process demonstrated throughout the pelvis with fluid and infiltration in the low pelvis and mesenteric. At appendicolith with abnormal appearing appendix, likely this represents perforated appendicitis. No discrete abscess is identified. There is small bowel wall thickening of the pelvic loops with proximal small bowel obstruction.  These results were called by telephone at the time of interpretation on 12/08/2014 at 2:51 am to [place] , who verbally acknowledged these results.   Electronically Signed   By: Lucienne Capers M.D.   On: 12/08/2014 02:53    Review of Systems  Constitutional: Positive for fever, chills and malaise/fatigue.  HENT: Negative.   Eyes: Negative.   Respiratory: Negative.   Cardiovascular: Negative.   Gastrointestinal: Positive for nausea, vomiting, abdominal pain and diarrhea.  Genitourinary: Negative.   Musculoskeletal: Negative.   Skin: Negative.   Psychiatric/Behavioral: Positive for depression.    Blood pressure 108/48, pulse 69, temperature 98.3 F (36.8 C), temperature source Oral, resp. rate 19, height 5' 3" (1.6 m), weight 127.37 kg (280 lb 12.8 oz), SpO2 96 %. Physical Exam  Constitutional: She appears distressed.  HENT:  Head: Normocephalic and atraumatic.  Eyes: Pupils are equal, round, and reactive to light. No scleral icterus.  Neck: Normal range of motion.  Neck supple.  Cardiovascular: Normal rate.   Respiratory: Effort normal and breath sounds normal.  GI: Soft. There is tenderness in the periumbilical area. There is guarding. There is no rigidity and no rebound.  Skin: Skin is warm and dry.  Psychiatric: Her mood appears anxious. Her speech is delayed. She is slowed.     Assessment/Plan Perforated appendicitis   IV ABX NPO May need NGT / laparotomy if no improvement with medical management  DM 2  SSI asked medicine to see  Bipolar Disease          Stable  Morbid obesity  AKI     probably secondary to dehydration  IVF   Hx CAD  On plavix  Asked medicine to see   , A. 12/08/2014, 4:00 AM

## 2014-12-08 NOTE — ED Notes (Signed)
Pt's cbg 168; hold on insulin drip after conversing with Charge RN, Onalee Hua

## 2014-12-08 NOTE — ED Notes (Signed)
O2 stats in the low 90's, placed pt on 2L of O2.  State returned to 95%.  Will continue to monitor.

## 2014-12-08 NOTE — Progress Notes (Signed)
Subjective: Some lower abd pain, no n/v  Objective: Vital signs in last 24 hours: Temp:  [97.9 F (36.6 C)-98.3 F (36.8 C)] 97.9 F (36.6 C) (06/15 0717) Pulse Rate:  [65-90] 82 (06/15 1200) Resp:  [14-24] 24 (06/15 1200) BP: (76-109)/(26-62) 86/43 mmHg (06/15 1200) SpO2:  [92 %-99 %] 92 % (06/15 1200) Weight:  [127.37 kg (280 lb 12.8 oz)] 127.37 kg (280 lb 12.8 oz) (06/15 0645)    Intake/Output from previous day: 06/14 0701 - 06/15 0700 In: 5050 [I.V.:5000; IV Piggyback:50] Out: -  Intake/Output this shift: Total I/O In: 827.5 [I.V.:477.5; IV Piggyback:350] Out: 500 [Urine:500]  GI: mild tender lower abdomen, obese, some bs  Lab Results:   Recent Labs  12/07/14 1930 12/08/14 0820  WBC 15.4* 13.8*  HGB 13.8 11.6*  HCT 39.5 33.1*  PLT 278 233   BMET  Recent Labs  12/08/14 0820 12/08/14 1057  NA 131* 131*  K 3.8 3.0*  CL 103 103  CO2 17* 21*  GLUCOSE 150* 174*  BUN 45* 45*  CREATININE 2.83* 2.66*  CALCIUM 7.0* 6.7*   PT/INR No results for input(s): LABPROT, INR in the last 72 hours. ABG No results for input(s): PHART, HCO3 in the last 72 hours.  Invalid input(s): PCO2, PO2  Studies/Results: Ct Abdomen Pelvis Wo Contrast  12/08/2014   CLINICAL DATA:  Severe bilateral lower quadrant pain. Nausea, vomiting, diarrhea, and dysuria.  EXAM: CT ABDOMEN AND PELVIS WITHOUT CONTRAST  TECHNIQUE: Multidetector CT imaging of the abdomen and pelvis was performed following the standard protocol without IV contrast.  COMPARISON:  None.  FINDINGS: Atelectasis in the lung bases.  The unenhanced appearance of the liver, spleen, gallbladder, pancreas, adrenal glands, kidneys, abdominal aorta, and inferior vena cava are unremarkable. Small accessory spleen. Stomach is unremarkable. No free air or free fluid in the abdomen.  Pelvis: There is an inflammatory process in the anterior pelvis with edema in the mesentery and pelvic fat, small amount of free fluid along the  pericolic gutters and in the deep pelvis, small bowel wall thickening, and proximal small bowel dilatation. There is an at appendicolith present. With the appendix is distended and thick walled. Most likely this represents acute appendicitis with perforation and mesenteric infiltration causing reactive bowel edema an proximal obstruction of small bowel. Crohn's disease would be another possibility. No free air is suggested. Uterus and ovaries are not enlarged. Iliac artery stent is present. Degenerative changes in the spine.  IMPRESSION: Inflammatory process demonstrated throughout the pelvis with fluid and infiltration in the low pelvis and mesenteric. At appendicolith with abnormal appearing appendix, likely this represents perforated appendicitis. No discrete abscess is identified. There is small bowel wall thickening of the pelvic loops with proximal small bowel obstruction.  These results were called by telephone at the time of interpretation on 12/08/2014 at 2:51 am to [facility] , who verbally acknowledged these results.   Electronically Signed   By: Burman Nieves M.D.   On: 12/08/2014 02:53    Anti-infectives: Anti-infectives    Start     Dose/Rate Route Frequency Ordered Stop   12/08/14 0800  ciprofloxacin (CIPRO) IVPB 400 mg     400 mg 200 mL/hr over 60 Minutes Intravenous Every 12 hours 12/08/14 0652     12/08/14 0800  metroNIDAZOLE (FLAGYL) IVPB 500 mg     500 mg 100 mL/hr over 60 Minutes Intravenous Every 8 hours 12/08/14 0652     12/08/14 0330  piperacillin-tazobactam (ZOSYN) IVPB 3.375 g  3.375 g 100 mL/hr over 30 Minutes Intravenous  Once 12/08/14 0328 12/08/14 1610      Assessment/Plan: Perforated appendicitis ARI  Plan for picc placement today due to access and likely need for long term access Foley for monitoring Recheck bmet Appreciate hospitalists evaluation Will give 24-48 hours (or if gets worse) before surgery, hopefully given her abdomen and size will  conservatively get better  [facility] 12/08/2014

## 2014-12-08 NOTE — ED Notes (Signed)
Attempted in and out cath; no urine return; bladder scan completed, 16ml in bladder

## 2014-12-08 NOTE — Consult Note (Addendum)
[facility] Medical Consultation  Allison [name] UJW:[id number] DOB: 11-May-1961 DOA: 12/07/2014 PCP: No PCP Per Patient   Requesting physician: Dr. Luisa Hart Date of consultation: 12/08/14 Reason for consultation: Medical management, IDDM, and CAD history in patient with perforated appendicitis  Impression/Recommendations Principal Problem:   Perforated appendicitis Active Problems:   IDDM (insulin dependent diabetes mellitus)   CORONARY ATHEROSCLEROSIS, NATIVE VESSEL   Severe sepsis with acute organ dysfunction   Acute kidney failure   Hypokalemia    1. Perforated appendicitis - surgery is primary 2. Severe sepsis with borderline low BPs and Acute kidney injury 1. Aggressive hydration 2. Would recommend a low threshold for ICU transfer to start pressors if BP she dosent respond to hydration 3. Have ordered strict intake and output to better monitor UOP and renal function 4. Hold all home anti-hypertensive meds in this hypotensive patient, would also hold lasix in this patient. 3. IDDM - given that patient who has very high baseline insulin requirements (260 units per day of insulin) is critically ill, has AKI, and also will be kept NPO; best strategy in my opinion is glucose stabilizer for acute phase of illness. 1. Have ordered gluco stabilizer protocol 2. CBG checks Q1H per gluco stabilizer 4. History of CAD - patient had cardiac stent placement in 2011, apparently also has PAD with leg stent placement in either 2012 or 2013 she states. 1. Serial trops due to the chest pain she says she had earlier in the day; however, I suspect that this chest pain was at most due to demand ischemia secondary to her profoundly low BPs on presentation to the ED (76/37) and not due to ACS. 2. Tele monitor 3. EKG ordered and pending 4. Should be okay to hold plavix for surgery as she is now 5 years out from stent placement. 5. Hypokalemia - replacing potassium, rechecking BMP this  morning.  I will followup again tomorrow. Please contact me if I can be of assistance in the meanwhile. Thank you for this consultation.  Chief Complaint: Abdominal pain  HPI:  54 yo F with h/o IDDM, NSTEMI, who presents to the ED with c/o diffuse abdominal pain.  Symptoms onset 2 days ago, 10/10 in severity, worse in lower abdomen.  Patient has N/V/D.  10 episodes of diarrhea today.  No blood in vomit or diarrhea.  Has fever and chills.  No SOB.  Review of Systems:  Patient does report "my chest did get to hurting earlier today, but its okay now".   Past Medical History  Diagnosis Date  . Non-ST elevated myocardial infarction (non-STEMI)   . Dysphasia   . Hypotension   . Hypokalemia   . Non-insulin dependent diabetes mellitus   . Hyperlipidemia   . Migraine headache   . Hemorrhoids   . History of MRSA infection    Past Surgical History  Procedure Laterality Date  . Knee arthroscopy  1996    Right  . Endometrial ablation  2009    Laser  . Esophageal dilation  2006  . Cardiac catheterization  01/03/2010   Social History:  reports that she has quit smoking. She has never used smokeless tobacco. She reports that she does not drink alcohol or use illicit drugs.  Allergies  Allergen Reactions  . Sulfonamide Derivatives Anaphylaxis  . Aspirin Nausea And Vomiting  . Penicillins Hives  . Latex Rash   Family History  Problem Relation Age of Onset  . Bipolar disorder Sister     Prior to Admission medications  Medication Sig Start Date End Date Taking? Authorizing Provider  acetaminophen (TYLENOL) 500 MG tablet Take 1,000 mg by mouth every 6 (six) hours as needed for mild pain.    Yes Historical Provider, MD  albuterol (PROVENTIL HFA;VENTOLIN HFA) 108 (90 BASE) MCG/ACT inhaler Inhale 2 puffs into the lungs every 6 (six) hours as needed for wheezing or shortness of breath.   Yes Historical Provider, MD  aspirin EC 81 MG tablet Take 81 mg by mouth every other day.   Yes  Historical Provider, MD  atorvastatin (LIPITOR) 10 MG tablet Take 10 mg by mouth daily.   Yes Historical Provider, MD  buPROPion (WELLBUTRIN XL) 150 MG 24 hr tablet Take 1 tablet (150 mg total) by mouth daily. 09/09/14  Yes Cleotis Nipper, MD  Choline Fenofibrate (TRILIPIX) 135 MG capsule Take 135 mg by mouth daily.     Yes Historical Provider, MD  cilostazol (PLETAL) 100 MG tablet Take 100 mg by mouth 2 (two) times daily.   Yes Historical Provider, MD  clopidogrel (PLAVIX) 75 MG tablet Take 75 mg by mouth daily.     Yes Historical Provider, MD  ezetimibe (ZETIA) 10 MG tablet Take 10 mg by mouth daily.   Yes Historical Provider, MD  furosemide (LASIX) 20 MG tablet Take 20 mg by mouth 2 (two) times daily.   Yes Historical Provider, MD  HYDROcodone-acetaminophen (NORCO) 10-325 MG per tablet Take 1 tablet by mouth every 6 (six) hours as needed for moderate pain.  05/18/14  Yes Historical Provider, MD  insulin glargine (LANTUS) 100 UNIT/ML injection Inject 110 Units into the skin at bedtime.    Yes Historical Provider, MD  insulin glulisine (APIDRA) 100 UNIT/ML injection Inject 50 Units into the skin 3 (three) times daily.    Yes Historical Provider, MD  lubiprostone (AMITIZA) 8 MCG capsule Take 8 mcg by mouth 2 (two) times daily with a meal.   Yes Historical Provider, MD  nitroGLYCERIN (NITROSTAT) 0.4 MG SL tablet Place 0.4 mg under the tongue every 5 (five) minutes as needed for chest pain. May repeat for up to 3 doses.   Yes Historical Provider, MD  Olmesartan-Amlodipine-HCTZ (TRIBENZOR) 40-5-25 MG TABS Take 1 tablet by mouth daily.    Yes Historical Provider, MD  omeprazole (PRILOSEC) 40 MG capsule Take 40 mg by mouth 2 (two) times daily.   Yes Historical Provider, MD  PARoxetine (PAXIL-CR) 37.5 MG 24 hr tablet Take 1 tablet (37.5 mg total) by mouth daily. 09/09/14  Yes Cleotis Nipper, MD  potassium chloride SA (K-DUR,KLOR-CON) 20 MEQ tablet Take 20 mEq by mouth daily.   Yes Historical Provider, MD   ranolazine (RANEXA) 1000 MG SR tablet Take 500 mg by mouth 2 (two) times daily.   Yes Historical Provider, MD  sitaGLIPtin (JANUVIA) 100 MG tablet Take 100 mg by mouth daily.     Yes Historical Provider, MD  Vitamin D, Ergocalciferol, (DRISDOL) 50000 UNITS CAPS capsule Take 50,000 Units by mouth every 7 (seven) days.   Yes Historical Provider, MD   Physical Exam: Blood pressure 89/61, pulse 70, temperature 98.2 F (36.8 C), temperature source Oral, resp. rate 20, height  (1.6 m), weight 127.37 kg (280 lb 12.8 oz), SpO2 95 %. Filed Vitals:   12/08/14 0443  BP: 89/61  Pulse: 70  Temp: 98.2 F (36.8 C)  Resp: 20    General:  Distressed, ill appearing. Eyes: PEERLA EOMI ENT: mucous membranes moist Neck: supple w/o JVD Cardiovascular: RRR w/o MRG Respiratory: CTA  B Abdomen: Obese, guarding in RLQ, diffuse tenderness is worst in RLQ Skin: no rash nor lesion, diaphoretic Musculoskeletal: MAE, full ROM all 4 extremities Psychiatric: inappropriate affect, laughing at inappropriate times, speech is rapid / pressured  Neurologic: AAOx3, grossly non-focal  Labs on Admission:  Basic Metabolic Panel:  Recent Labs Lab 12/07/14 1930  NA 129*  K 2.9*  CL 96*  CO2 19*  GLUCOSE 183*  BUN 41*  CREATININE 2.33*  CALCIUM 8.3*   Liver Function Tests:  Recent Labs Lab 12/07/14 1930  AST 22  ALT 18  ALKPHOS 65  BILITOT 0.8  PROT 6.8  ALBUMIN 2.9*    Recent Labs Lab 12/07/14 1930  LIPASE <10*   No results for input(s): AMMONIA in the last 168 hours. CBC:  Recent Labs Lab 12/07/14 1930  WBC 15.4*  NEUTROABS 13.7*  HGB 13.8  HCT 39.5  MCV 87.8  PLT 278   Cardiac Enzymes: No results for input(s): CKTOTAL, CKMB, CKMBINDEX, TROPONINI in the last 168 hours. BNP: Invalid input(s): POCBNP CBG: No results for input(s): GLUCAP in the last 168 hours.  Radiological Exams on Admission: Ct Abdomen Pelvis Wo Contrast  12/08/2014   CLINICAL DATA:  Severe bilateral  lower quadrant pain. Nausea, vomiting, diarrhea, and dysuria.  EXAM: CT ABDOMEN AND PELVIS WITHOUT CONTRAST  TECHNIQUE: Multidetector CT imaging of the abdomen and pelvis was performed following the standard protocol without IV contrast.  COMPARISON:  None.  FINDINGS: Atelectasis in the lung bases.  The unenhanced appearance of the liver, spleen, gallbladder, pancreas, adrenal glands, kidneys, abdominal aorta, and inferior vena cava are unremarkable. Small accessory spleen. Stomach is unremarkable. No free air or free fluid in the abdomen.  Pelvis: There is an inflammatory process in the anterior pelvis with edema in the mesentery and pelvic fat, small amount of free fluid along the pericolic gutters and in the deep pelvis, small bowel wall thickening, and proximal small bowel dilatation. There is an at appendicolith present. With the appendix is distended and thick walled. Most likely this represents acute appendicitis with perforation and mesenteric infiltration causing reactive bowel edema an proximal obstruction of small bowel. Crohn's disease would be another possibility. No free air is suggested. Uterus and ovaries are not enlarged. Iliac artery stent is present. Degenerative changes in the spine.  IMPRESSION: Inflammatory process demonstrated throughout the pelvis with fluid and infiltration in the low pelvis and mesenteric. At appendicolith with abnormal appearing appendix, likely this represents perforated appendicitis. No discrete abscess is identified. There is small bowel wall thickening of the pelvic loops with proximal small bowel obstruction.  These results were called by telephone at the time of interpretation on 12/08/2014 at 2:51 am to PA [facility] , who verbally acknowledged these results.   Electronically Signed   By: Burman Nieves M.D.   On: 12/08/2014 02:53    EKG: Independently reviewed.  Time spent: 80 min  Kayton Dunaj M. [facility] Pager [phone]  If 7PM-7AM,  please contact night-coverage [email or web address] Password [email or web address] 12/08/2014, 4:45 AM

## 2014-12-08 NOTE — ED Notes (Signed)
CT informed pt has finished drinking contrast

## 2014-12-08 NOTE — ED Notes (Signed)
Informed provider that pt continues to have low blood pressure.

## 2014-12-08 NOTE — Progress Notes (Signed)
Dr. Rito Ehrlich notified of BP results.  Order received for NS bolus 500cc now.

## 2014-12-08 NOTE — Progress Notes (Signed)
PROGRESS NOTE  Allison [name] ZOX:[id number] DOB: 09/17/1960 DOA: 12/07/2014 PCP: No PCP Per Patient  HPI/Recap of past 6 hours: 54 year old female with past medical history of uncontrolled diabetes mellitus, CAD and morbid obesity admitted to surgical service on 6/14 for sepsis secondary to perforated appendix. Hospitalists consulted for management of medical issues. Patient placed in surgical ICU noted to be hypotensive and started on IV fluids plus anti-biotics  By following day, patient still complaining of abdominal pain. No problems breathing. White blood cell count somewhat improved, still somewhat hypotensive. Patient also noted to have worsening renal failure although by later in the morning this had improved.  Assessment/Plan:   DM type 2, uncontrolled, with renal complications: For now on by mouth, every 4 hours sliding scale sensitive.    CORONARY ATHEROSCLEROSIS, NATIVE VESSEL: Stable.    Severe sepsis with acute organ dysfunction secondary to perforated appendix.: Patient is criteria given lactic acid level, markedly leukocytosis, organ dysfunction, hypotension and tachycardia with appendix as source. Continue broad-spectrum anti-biotics. Improving. Plan is for surgery in the next few days.    Acute kidney failure, likely in the setting of underlying chronic kidney disease. Several years ago, renal function normal, but no in between labs since. Continue aggressive hydration and will likely be able to determine long-term renal function. Patient likely does need referral to [facility] as outpatient.    Hypokalemia: Secondary to volume resuscitation plus hyperglycemia. Replacing as needed.    Morbid obesity: Patient is criteria with BMI greater than 40   Code Status: Full code  Family Communication: No family present  Disposition Plan: Continued surgical ICU.   Consultants:  hospitalists  Procedures:  None  Antibiotics:  IV flagyl  6/15-present  IV cipro 6/15-present  IV zosyn x one 6/15:pre-op   Objective: BP 118/55 mmHg  Pulse 79  Temp(Src) 97.7 F (36.5 C) (Oral)  Resp 19  Ht  (1.6 m)  Wt 127.37 kg (280 lb 12.8 oz)  BMI 49.75 kg/m2  SpO2 96%  Intake/Output Summary (Last 24 hours) at 12/08/14 1605 Last data filed at 12/08/14 1500  Gross per 24 hour  Intake 6777.5 ml  Output    975 ml  Net 5802.5 ml   Filed Weights   12/07/14 1918 12/08/14 0645  Weight: 127.37 kg (280 lb 12.8 oz) 127.37 kg (280 lb 12.8 oz)    Exam:   General:  Alert & oriented x 3, mild distress from abd pain  Cardiovascular: Regular rate and rhythm, S1-S2  Respiratory: Clear to auscultation bilaterally  Abdomen: Obese, generalized tenderness, scant bowel sounds  Musculoskeletal: No edema  Data Reviewed: Basic Metabolic Panel:  Recent Labs Lab 12/07/14 1930 12/08/14 0820 12/08/14 1057  NA 129* 131* 131*  K 2.9* 3.8 3.0*  CL 96* 103 103  CO2 19* 17* 21*  GLUCOSE 183* 150* 174*  BUN 41* 45* 45*  CREATININE 2.33* 2.83* 2.66*  CALCIUM 8.3* 7.0* 6.7*   Liver Function Tests:  Recent Labs Lab 12/07/14 1930 12/08/14 0820  AST 22 22  ALT 18 16  ALKPHOS 65 54  BILITOT 0.8 0.5  PROT 6.8 5.3*  ALBUMIN 2.9* 2.1*    Recent Labs Lab 12/07/14 1930  LIPASE <10*   No results for input(s): AMMONIA in the last 168 hours. CBC:  Recent Labs Lab 12/07/14 1930 12/08/14 0820  WBC 15.4* 13.8*  NEUTROABS 13.7*  --   HGB 13.8 11.6*  HCT 39.5 33.1*  MCV 87.8 86.9  PLT 278 233  Cardiac Enzymes:    Recent Labs Lab 12/08/14 0507 12/08/14 1057  TROPONINI 0.06* <0.03   BNP (last 3 results) No results for input(s): BNP in the last 8760 hours.  ProBNP (last 3 results) No results for input(s): PROBNP in the last 8760 hours.  CBG:  Recent Labs Lab 12/08/14 0516 12/08/14 0657 12/08/14 0759 12/08/14 1105 12/08/14 1352  GLUCAP 168* 156* 155* 165* 140*    No results found for this or any  previous visit (from the past 240 hour(s)).   Studies: Ct Abdomen Pelvis Wo Contrast  12/08/2014   CLINICAL DATA:  Severe bilateral lower quadrant pain. Nausea, vomiting, diarrhea, and dysuria.  EXAM: CT ABDOMEN AND PELVIS WITHOUT CONTRAST  TECHNIQUE: Multidetector CT imaging of the abdomen and pelvis was performed following the standard protocol without IV contrast.  COMPARISON:  None.  FINDINGS: Atelectasis in the lung bases.  The unenhanced appearance of the liver, spleen, gallbladder, pancreas, adrenal glands, kidneys, abdominal aorta, and inferior vena cava are unremarkable. Small accessory spleen. Stomach is unremarkable. No free air or free fluid in the abdomen.  Pelvis: There is an inflammatory process in the anterior pelvis with edema in the mesentery and pelvic fat, small amount of free fluid along the pericolic gutters and in the deep pelvis, small bowel wall thickening, and proximal small bowel dilatation. There is an at appendicolith present. With the appendix is distended and thick walled. Most likely this represents acute appendicitis with perforation and mesenteric infiltration causing reactive bowel edema an proximal obstruction of small bowel. Crohn's disease would be another possibility. No free air is suggested. Uterus and ovaries are not enlarged. Iliac artery stent is present. Degenerative changes in the spine.  IMPRESSION: Inflammatory process demonstrated throughout the pelvis with fluid and infiltration in the low pelvis and mesenteric. At appendicolith with abnormal appearing appendix, likely this represents perforated appendicitis. No discrete abscess is identified. There is small bowel wall thickening of the pelvic loops with proximal small bowel obstruction.  These results were called by telephone at the time of interpretation on 12/08/2014 at 2:51 am to PA [facility] , who verbally acknowledged these results.   Electronically Signed   By: Burman Nieves M.D.   On: 12/08/2014  02:53    Scheduled Meds: . ciprofloxacin  400 mg Intravenous Q12H  . enoxaparin (LOVENOX) injection  40 mg Subcutaneous Q24H  . insulin aspart  0-9 Units Subcutaneous 6 times per day  . metronidazole  500 mg Intravenous Q8H  . potassium chloride  10 mEq Intravenous Q1 Hr x 4  . sodium chloride  10-40 mL Intracatheter Q12H    Continuous Infusions: . sodium chloride 150 mL/hr at 12/08/14 1500     Time spent: 25 minutes  Hollice Espy  [facility] Pager [phone]. If 7PM-7AM, please contact night-coverage at [email or web address], password [facility] 12/08/2014, 4:05 PM  LOS: 0 days

## 2014-12-08 NOTE — Progress Notes (Signed)
Peripherally Inserted Central Catheter/Midline Placement  The IV Nurse has discussed with the patient and/or persons authorized to consent for the patient, the purpose of this procedure and the potential benefits and risks involved with this procedure.  The benefits include less needle sticks, lab draws from the catheter and patient may be discharged home with the catheter.  Risks include, but not limited to, infection, bleeding, blood clot (thrombus formation), and puncture of an artery; nerve damage and irregular heat beat.  Alternatives to this procedure were also discussed.  PICC/Midline Placement Documentation        Allison [name] 12/08/2014, 10:11 AM

## 2014-12-09 LAB — BASIC METABOLIC PANEL
Anion gap: 9 (ref 5–15)
BUN: 42 mg/dL — ABNORMAL HIGH (ref 6–20)
CHLORIDE: 107 mmol/L (ref 101–111)
CO2: 18 mmol/L — AB (ref 22–32)
Calcium: 6.8 mg/dL — ABNORMAL LOW (ref 8.9–10.3)
Creatinine, Ser: 2 mg/dL — ABNORMAL HIGH (ref 0.44–1.00)
GFR calc Af Amer: 32 mL/min — ABNORMAL LOW (ref >60)
GFR calc non Af Amer: 27 mL/min — ABNORMAL LOW (ref >60)
Glucose, Bld: 127 mg/dL — ABNORMAL HIGH (ref 65–99)
Potassium: 3.3 mmol/L — ABNORMAL LOW (ref 3.5–5.1)
Sodium: 134 mmol/L — ABNORMAL LOW (ref 135–145)

## 2014-12-09 LAB — CBC
HEMATOCRIT: 33.3 % — AB (ref 36.0–46.0)
Hemoglobin: 10.9 g/dL — ABNORMAL LOW (ref 12.0–15.0)
MCH: 29.2 pg (ref 26.0–34.0)
MCHC: 32.7 g/dL (ref 30.0–36.0)
MCV: 89.3 fL (ref 78.0–100.0)
Platelets: 281 10*3/uL (ref 150–400)
RBC: 3.73 MIL/uL — ABNORMAL LOW (ref 3.87–5.11)
RDW: 13.9 % (ref 11.5–15.5)
WBC: 12.5 10*3/uL — ABNORMAL HIGH (ref 4.0–10.5)

## 2014-12-09 LAB — HEMOGLOBIN A1C
HEMOGLOBIN A1C: 6 % — AB (ref 4.8–5.6)
Mean Plasma Glucose: 126 mg/dL

## 2014-12-09 LAB — GLUCOSE, CAPILLARY
GLUCOSE-CAPILLARY: 136 mg/dL — AB (ref 65–99)
GLUCOSE-CAPILLARY: 145 mg/dL — AB (ref 65–99)
Glucose-Capillary: 115 mg/dL — ABNORMAL HIGH (ref 65–99)
Glucose-Capillary: 120 mg/dL — ABNORMAL HIGH (ref 65–99)
Glucose-Capillary: 127 mg/dL — ABNORMAL HIGH (ref 65–99)
Glucose-Capillary: 156 mg/dL — ABNORMAL HIGH (ref 65–99)

## 2014-12-09 MED ORDER — SODIUM CHLORIDE 0.9 % IV BOLUS (SEPSIS)
1000.0000 mL | Freq: Once | INTRAVENOUS | Status: DC
Start: 2014-12-09 — End: 2014-12-10

## 2014-12-09 MED ORDER — SODIUM CHLORIDE 0.9 % IV SOLN
1.0000 g | INTRAVENOUS | Status: DC
  Filled 2014-12-09: qty 1

## 2014-12-09 MED ORDER — DIPHENHYDRAMINE HCL 50 MG/ML IJ SOLN
12.5000 mg | INTRAMUSCULAR | Status: DC
  Administered 2014-12-09 – 2014-12-10 (×2): 12.5 mg via INTRAVENOUS
  Filled 2014-12-09: qty 0.25

## 2014-12-09 MED ORDER — POTASSIUM CHLORIDE 10 MEQ/100ML IV SOLN
10.0000 meq | INTRAVENOUS | Status: AC
  Administered 2014-12-09 (×4): 10 meq via INTRAVENOUS
  Filled 2014-12-09 (×4): qty 100

## 2014-12-09 MED ORDER — KCL IN DEXTROSE-NACL 20-5-0.45 MEQ/L-%-% IV SOLN
INTRAVENOUS | Status: DC
  Administered 2014-12-09 – 2014-12-10 (×3): via INTRAVENOUS
  Filled 2014-12-09 (×6): qty 1000

## 2014-12-09 MED ORDER — SODIUM CHLORIDE 0.9 % IV SOLN
1.0000 g | INTRAVENOUS | Status: AC
  Administered 2014-12-09 – 2014-12-23 (×15): 1 g via INTRAVENOUS
  Filled 2014-12-09 (×15): qty 1

## 2014-12-09 MED ORDER — DIPHENHYDRAMINE HCL 50 MG/ML IJ SOLN
12.5000 mg | Freq: Once | INTRAMUSCULAR | Status: DC
  Filled 2014-12-09: qty 1

## 2014-12-09 MED ORDER — ERTAPENEM SODIUM 1 G IJ SOLR: 1.0000 g | INTRAMUSCULAR | Status: DC

## 2014-12-09 NOTE — Clinical Documentation Improvement (Signed)
Supporting Information: Patient with acute kidney failure, likely in the setting of chronic kidney disease per 6/15 progress notes.  Labs:   Bun    Creat    GFR: 6/16:     42       2.00      27 6/15:     45       2.66      19 6/14:     41       2.33      23   . Document the stage of CKD --Chronic kidney disease, stage 1- GFR > OR = 90 --Chronic kidney disease, stage 2 (mild) - GFR 60-89 --Chronic kidney disease, stage 3 (moderate) - GFR 30-59 --Chronic kidney disease, stage 4 (severe) - GFR 15-29 --Chronic kidney disease, stage 5- GFR < 15 --End-stage renal disease (ESRD) . Document any underlying cause of CKD such as Diabetes or Hypertension . Document if the patient is dependent on Dialysis . Chronic renal failure without a documented stage will be assigned to Chronic kidney disease, unspecified . Document any associated diagnoses/conditions     Thank Gabriel Cirri Documentation Specialist [phone] [email or web address]

## 2014-12-09 NOTE — Progress Notes (Signed)
PROGRESS NOTE  Allison [name] VWU:[id number] DOB: Nov 22, 1960 DOA: 12/07/2014 PCP: No PCP Per Patient  HPI/Recap of past 52 hours: 54 year old female with past medical history of uncontrolled diabetes mellitus, CAD and morbid obesity admitted to surgical service on 6/14 for sepsis secondary to perforated appendix. Hospitalists consulted for management of medical issues. Patient placed in surgical ICU noted to be hypotensive and started on IV fluids plus anti-biotics  Patient's hypotension responding to aggressive IV fluids with high rate plus boluses. Creatinine starting to turn around. White blood cell count continues to come down with IV and about asked.  Assessment/Plan:   DM type 2, uncontrolled, with renal complications: For now on by mouth, every 4 hours sliding scale sensitive.    CORONARY ATHEROSCLEROSIS, NATIVE VESSEL: Stable.    Severe sepsis with acute organ dysfunction secondary to perforated appendix.: Patient is criteria given lactic acid level, markedly leukocytosis, organ dysfunction, hypotension and tachycardia with appendix as source. Continue broad-spectrum anti-biotics. Improving. Plan is to monitor for another 24 more hours before deciding on surgery    Acute kidney failure, likely in the setting of underlying ? chronic kidney disease. Several years ago, renal function normal, but no in between labs since. Continue aggressive hydration and will likely be able to determine long-term renal function. Patient likely does need referral to [facility] as outpatient.    Hypokalemia: Secondary to volume resuscitation plus hyperglycemia. Replacing as needed.    Morbid obesity: Patient is criteria with BMI greater than 40   Code Status: Full code  Family Communication: No family present  Disposition Plan: Continue surgical stepdown.   Consultants:  hospitalists  Procedures:  None  Antibiotics:  IV flagyl 6/15-present  IV cipro 6/15-present  IV  zosyn x one 6/15:pre-op   Objective: BP 95/46 mmHg  Pulse 89  Temp(Src) 99.8 F (37.7 C) (Axillary)  Resp 22  Ht  (1.6 m)  Wt 127.37 kg (280 lb 12.8 oz)  BMI 49.75 kg/m2  SpO2 96%  Intake/Output Summary (Last 24 hours) at 12/09/14 1358 Last data filed at 12/09/14 1300  Gross per 24 hour  Intake   3900 ml  Output   2085 ml  Net   1815 ml   Filed Weights   12/07/14 1918 12/08/14 0645  Weight: 127.37 kg (280 lb 12.8 oz) 127.37 kg (280 lb 12.8 oz)    Exam: No change from previous day  General:  Alert & oriented x 3, mild distress from abd pain  Cardiovascular: Regular rate and rhythm, S1-S2  Respiratory: Clear to auscultation bilaterally  Abdomen: Obese, generalized tenderness, scant bowel sounds  Musculoskeletal: No edema  Data Reviewed: Basic Metabolic Panel:  Recent Labs Lab 12/07/14 1930 12/08/14 0820 12/08/14 1057 12/09/14 0410  NA 129* 131* 131* 134*  K 2.9* 3.8 3.0* 3.3*  CL 96* 103 103 107  CO2 19* 17* 21* 18*  GLUCOSE 183* 150* 174* 127*  BUN 41* 45* 45* 42*  CREATININE 2.33* 2.83* 2.66* 2.00*  CALCIUM 8.3* 7.0* 6.7* 6.8*   Liver Function Tests:  Recent Labs Lab 12/07/14 1930 12/08/14 0820  AST 22 22  ALT 18 16  ALKPHOS 65 54  BILITOT 0.8 0.5  PROT 6.8 5.3*  ALBUMIN 2.9* 2.1*    Recent Labs Lab 12/07/14 1930  LIPASE <10*   No results for input(s): AMMONIA in the last 168 hours. CBC:  Recent Labs Lab 12/07/14 1930 12/08/14 0820 12/09/14 0410  WBC 15.4* 13.8* 12.5*  NEUTROABS 13.7*  --   --  HGB 13.8 11.6* 10.9*  HCT 39.5 33.1* 33.3*  MCV 87.8 86.9 89.3  PLT 278 233 281   Cardiac Enzymes:    Recent Labs Lab 12/08/14 0507 12/08/14 1057 12/08/14 1800  TROPONINI 0.06* <0.03 <0.03   BNP (last 3 results) No results for input(s): BNP in the last 8760 hours.  ProBNP (last 3 results) No results for input(s): PROBNP in the last 8760 hours.  CBG:  Recent Labs Lab 12/08/14 1922 12/08/14 2346 12/09/14 0316  12/09/14 0746 12/09/14 1149  GLUCAP 109* 115* 120* 127* 136*    No results found for this or any previous visit (from the past 240 hour(s)).   Studies: No results found.  Scheduled Meds: . antiseptic oral rinse  7 mL Mouth Rinse q12n4p  . chlorhexidine  15 mL Mouth Rinse BID  . diphenhydrAMINE  12.5 mg Intravenous Q24H  . enoxaparin (LOVENOX) injection  40 mg Subcutaneous Q24H  . ertapenem  1 g Intravenous Q24H  . insulin aspart  0-9 Units Subcutaneous 6 times per day  . potassium chloride  10 mEq Intravenous Q1 Hr x 4  . sodium chloride  1,000 mL Intravenous Once  . sodium chloride  10-40 mL Intracatheter Q12H    Continuous Infusions: . dextrose 5 % and 0.45 % NaCl with KCl 20 mEq/L 125 mL/hr at 12/09/14 1022     Time spent: 15 minutes  Hollice Espy  [facility] Pager [phone]. If 7PM-7AM, please contact night-coverage at [email or web address], password [facility] 12/09/2014, 1:58 PM  LOS: 1 day

## 2014-12-09 NOTE — Progress Notes (Signed)
Subjective: Still some abd pain but I think its better, no n/v/bm, bp low at times  Objective: Vital signs in last 24 hours: Temp:  [97.7 F (36.5 C)-99.2 F (37.3 C)] 99.2 F (37.3 C) (06/16 0700) Pulse Rate:  [71-97] 89 (06/16 0700) Resp:  [0-26] 22 (06/16 0700) BP: (62-118)/(39-69) 95/46 mmHg (06/16 0700) SpO2:  [92 %-100 %] 96 % (06/16 0700)    Intake/Output from previous day: 06/15 0701 - 06/16 0700 In: 5127.5 [I.V.:3477.5; IV Piggyback:1650] Out: 2070 [Urine:2070] Intake/Output this shift:    General appearance: no distress Resp: diminished breath sounds bibasilar Cardio: regular rate and rhythm GI: soft minimally tender to palpation, no bs  Lab Results:   Recent Labs  12/08/14 0820 12/09/14 0410  WBC 13.8* 12.5*  HGB 11.6* 10.9*  HCT 33.1* 33.3*  PLT 233 281   BMET  Recent Labs  12/08/14 1057 12/09/14 0410  NA 131* 134*  K 3.0* 3.3*  CL 103 107  CO2 21* 18*  GLUCOSE 174* 127*  BUN 45* 42*  CREATININE 2.66* 2.00*  CALCIUM 6.7* 6.8*   PT/INR No results for input(s): LABPROT, INR in the last 72 hours. ABG No results for input(s): PHART, HCO3 in the last 72 hours.  Invalid input(s): PCO2, PO2  Studies/Results: Ct Abdomen Pelvis Wo Contrast  12/08/2014   CLINICAL DATA:  Severe bilateral lower quadrant pain. Nausea, vomiting, diarrhea, and dysuria.  EXAM: CT ABDOMEN AND PELVIS WITHOUT CONTRAST  TECHNIQUE: Multidetector CT imaging of the abdomen and pelvis was performed following the standard protocol without IV contrast.  COMPARISON:  None.  FINDINGS: Atelectasis in the lung bases.  The unenhanced appearance of the liver, spleen, gallbladder, pancreas, adrenal glands, kidneys, abdominal aorta, and inferior vena cava are unremarkable. Small accessory spleen. Stomach is unremarkable. No free air or free fluid in the abdomen.  Pelvis: There is an inflammatory process in the anterior pelvis with edema in the mesentery and pelvic fat, small amount of free  fluid along the pericolic gutters and in the deep pelvis, small bowel wall thickening, and proximal small bowel dilatation. There is an at appendicolith present. With the appendix is distended and thick walled. Most likely this represents acute appendicitis with perforation and mesenteric infiltration causing reactive bowel edema an proximal obstruction of small bowel. Crohn's disease would be another possibility. No free air is suggested. Uterus and ovaries are not enlarged. Iliac artery stent is present. Degenerative changes in the spine.  IMPRESSION: Inflammatory process demonstrated throughout the pelvis with fluid and infiltration in the low pelvis and mesenteric. At appendicolith with abnormal appearing appendix, likely this represents perforated appendicitis. No discrete abscess is identified. There is small bowel wall thickening of the pelvic loops with proximal small bowel obstruction.  These results were called by telephone at the time of interpretation on 12/08/2014 at 2:51 am to PA [facility] , who verbally acknowledged these results.   Electronically Signed   By: Burman Nieves M.D.   On: 12/08/2014 02:53    Anti-infectives: Anti-infectives    Start     Dose/Rate Route Frequency Ordered Stop   12/09/14 0945  ertapenem [facility]) injection 1 g    Comments:  She has hives from pcn before, the cross reactivity is low and I think there is some benefit to hopefully avoiding surgery in this patient without other good choices for abx, I think cipro/flagyl has too much resistance and she has perforation   1 g Intramuscular Every 24 hours 12/09/14 0937  12/08/14 0800  ciprofloxacin (CIPRO) IVPB 400 mg  Status:  Discontinued     400 mg 200 mL/hr over 60 Minutes Intravenous Every 12 hours 12/08/14 0652 12/09/14 0933   12/08/14 0800  metroNIDAZOLE (FLAGYL) IVPB 500 mg  Status:  Discontinued     500 mg 100 mL/hr over 60 Minutes Intravenous Every 8 hours 12/08/14 0652 12/09/14 0933   12/08/14 0330   piperacillin-tazobactam (ZOSYN) IVPB 3.375 g     3.375 g 100 mL/hr over 30 Minutes Intravenous  Once 12/08/14 0328 12/08/14 0513      Assessment/Plan: Perforated appendicitis ARI  Will continue pain meds Foley for monitoring, cr improving Appreciate hospitalists evaluation Will give 24 more hours (or if gets worse) before surgery, hopefully given her abdomen and size will conservatively get better lovenox scds I am going to switch abx, there is resistance at [facility] to c/f. She has pcn allergy and this is hives.  i think invanz reasonable with low cross reactivity. dont want to do others due to allergies and renal function.    [facility] 12/09/2014

## 2014-12-10 ENCOUNTER — Inpatient Hospital Stay (HOSPITAL_COMMUNITY): Payer: BC Managed Care – PPO | Admitting: Anesthesiology

## 2014-12-10 ENCOUNTER — Encounter (HOSPITAL_COMMUNITY): Admission: EM | Disposition: A | Discharge status: Skilled Nursing Facility | Payer: Self-pay | Source: Home / Self Care

## 2014-12-10 ENCOUNTER — Encounter (HOSPITAL_COMMUNITY): Payer: Self-pay | Admitting: Nurse Practitioner

## 2014-12-10 ENCOUNTER — Inpatient Hospital Stay (HOSPITAL_COMMUNITY): Payer: BC Managed Care – PPO

## 2014-12-10 ENCOUNTER — Ambulatory Visit (HOSPITAL_COMMUNITY): Payer: Self-pay | Admitting: Psychiatry

## 2014-12-10 DIAGNOSIS — J9601 Acute respiratory failure with hypoxia: Secondary | ICD-10-CM

## 2014-12-10 DIAGNOSIS — Z9889 Other specified postprocedural states: Secondary | ICD-10-CM

## 2014-12-10 DIAGNOSIS — K352 Acute appendicitis with generalized peritonitis: Secondary | ICD-10-CM

## 2014-12-10 DIAGNOSIS — K659 Peritonitis, unspecified: Secondary | ICD-10-CM

## 2014-12-10 HISTORY — PX: LAPAROTOMY: SHX154

## 2014-12-10 LAB — PROTIME-INR
INR: 1.43 (ref 0.00–1.49)
PROTHROMBIN TIME: 17.5 s — AB (ref 11.6–15.2)

## 2014-12-10 LAB — BASIC METABOLIC PANEL
Anion gap: 8 (ref 5–15)
Anion gap: 7 (ref 5–15)
BUN: 33 mg/dL — ABNORMAL HIGH (ref 6–20)
BUN: 34 mg/dL — AB (ref 6–20)
CALCIUM: 6.9 mg/dL — AB (ref 8.9–10.3)
CHLORIDE: 110 mmol/L (ref 101–111)
CO2: 18 mmol/L — ABNORMAL LOW (ref 22–32)
CO2: 15 mmol/L — ABNORMAL LOW (ref 22–32)
Calcium: 6.8 mg/dL — ABNORMAL LOW (ref 8.9–10.3)
Chloride: 108 mmol/L (ref 101–111)
Creatinine, Ser: 1.63 mg/dL — ABNORMAL HIGH (ref 0.44–1.00)
Creatinine, Ser: 1.54 mg/dL — ABNORMAL HIGH (ref 0.44–1.00)
GFR calc Af Amer: 41 mL/min — ABNORMAL LOW (ref >60)
GFR, EST AFRICAN AMERICAN: 43 mL/min — AB (ref >60)
GFR, EST NON AFRICAN AMERICAN: 35 mL/min — AB (ref >60)
GFR, EST NON AFRICAN AMERICAN: 37 mL/min — AB (ref >60)
Glucose, Bld: 217 mg/dL — ABNORMAL HIGH (ref 65–99)
Glucose, Bld: 255 mg/dL — ABNORMAL HIGH (ref 65–99)
POTASSIUM: 4.3 mmol/L (ref 3.5–5.1)
Potassium: 4.2 mmol/L (ref 3.5–5.1)
SODIUM: 132 mmol/L — AB (ref 135–145)
Sodium: 134 mmol/L — ABNORMAL LOW (ref 135–145)

## 2014-12-10 LAB — POCT I-STAT 3, ART BLOOD GAS (G3+)
ACID-BASE DEFICIT: 8 mmol/L — AB (ref 0.0–2.0)
Acid-base deficit: 14 mmol/L — ABNORMAL HIGH (ref 0.0–2.0)
Acid-base deficit: 14 mmol/L — ABNORMAL HIGH (ref 0.0–2.0)
Acid-base deficit: 9 mmol/L — ABNORMAL HIGH (ref 0.0–2.0)
Bicarbonate: 13 mEq/L — ABNORMAL LOW (ref 20.0–24.0)
Bicarbonate: 13.1 mEq/L — ABNORMAL LOW (ref 20.0–24.0)
Bicarbonate: 16.2 mEq/L — ABNORMAL LOW (ref 20.0–24.0)
Bicarbonate: 17.3 mEq/L — ABNORMAL LOW (ref 20.0–24.0)
O2 SAT: 94 %
O2 Saturation: 83 %
O2 Saturation: 84 %
O2 Saturation: 96 %
PCO2 ART: 35.9 mmHg (ref 35.0–45.0)
PCO2 ART: 35.6 mmHg (ref 35.0–45.0)
PCO2 ART: 34.4 mmHg — AB (ref 35.0–45.0)
PH ART: 7.176 — AB (ref 7.350–7.450)
Patient temperature: 99.2
Patient temperature: 99.2
Patient temperature: 100.7
Patient temperature: 98.4
TCO2: 14 mmol/L (ref 0–100)
TCO2: 14 mmol/L (ref 0–100)
TCO2: 17 mmol/L (ref 0–100)
TCO2: 18 mmol/L (ref 0–100)
pCO2 arterial: 35.3 mmHg (ref 35.0–45.0)
pH, Arterial: 7.173 — CL (ref 7.350–7.450)
pH, Arterial: 7.273 — ABNORMAL LOW (ref 7.350–7.450)
pH, Arterial: 7.31 — ABNORMAL LOW (ref 7.350–7.450)
pO2, Arterial: 59 mmHg — ABNORMAL LOW (ref 80.0–100.0)
pO2, Arterial: 61 mmHg — ABNORMAL LOW (ref 80.0–100.0)
pO2, Arterial: 85 mmHg (ref 80.0–100.0)
pO2, Arterial: 89 mmHg (ref 80.0–100.0)

## 2014-12-10 LAB — CBC
HEMATOCRIT: 35.3 % — AB (ref 36.0–46.0)
HEMATOCRIT: 35.6 % — AB (ref 36.0–46.0)
HEMOGLOBIN: 11.6 g/dL — AB (ref 12.0–15.0)
Hemoglobin: 11.4 g/dL — ABNORMAL LOW (ref 12.0–15.0)
MCH: 29.6 pg (ref 26.0–34.0)
MCH: 29.8 pg (ref 26.0–34.0)
MCHC: 32.3 g/dL (ref 30.0–36.0)
MCHC: 32.6 g/dL (ref 30.0–36.0)
MCV: 91.7 fL (ref 78.0–100.0)
MCV: 91.5 fL (ref 78.0–100.0)
PLATELETS: 332 10*3/uL (ref 150–400)
Platelets: 343 10*3/uL (ref 150–400)
RBC: 3.85 MIL/uL — ABNORMAL LOW (ref 3.87–5.11)
RBC: 3.89 MIL/uL (ref 3.87–5.11)
RDW: 14.5 % (ref 11.5–15.5)
RDW: 14.4 % (ref 11.5–15.5)
WBC: 16.1 10*3/uL — AB (ref 4.0–10.5)
WBC: 14.4 10*3/uL — ABNORMAL HIGH (ref 4.0–10.5)

## 2014-12-10 LAB — GLUCOSE, CAPILLARY
GLUCOSE-CAPILLARY: 177 mg/dL — AB (ref 65–99)
GLUCOSE-CAPILLARY: 232 mg/dL — AB (ref 65–99)
Glucose-Capillary: 181 mg/dL — ABNORMAL HIGH (ref 65–99)
Glucose-Capillary: 196 mg/dL — ABNORMAL HIGH (ref 65–99)
Glucose-Capillary: 205 mg/dL — ABNORMAL HIGH (ref 65–99)
Glucose-Capillary: 212 mg/dL — ABNORMAL HIGH (ref 65–99)
Glucose-Capillary: 233 mg/dL — ABNORMAL HIGH (ref 65–99)

## 2014-12-10 LAB — MAGNESIUM: MAGNESIUM: 1.7 mg/dL (ref 1.7–2.4)

## 2014-12-10 LAB — TRIGLYCERIDES: TRIGLYCERIDES: 161 mg/dL — AB (ref <150)

## 2014-12-10 LAB — APTT: aPTT: 44 seconds — ABNORMAL HIGH (ref 24–37)

## 2014-12-10 LAB — MRSA PCR SCREENING: MRSA by PCR: NEGATIVE

## 2014-12-10 LAB — LACTIC ACID, PLASMA: Lactic Acid, Venous: 1 mmol/L (ref 0.5–2.0)

## 2014-12-10 SURGERY — LAPAROTOMY, EXPLORATORY
Anesthesia: General | Site: Abdomen

## 2014-12-10 MED ORDER — ONDANSETRON HCL 4 MG/2ML IJ SOLN
INTRAMUSCULAR | Status: AC
  Filled 2014-12-10: qty 2

## 2014-12-10 MED ORDER — DEXTROSE IN LACTATED RINGERS 5 % IV SOLN
INTRAVENOUS | Status: DC
  Administered 2014-12-10 – 2014-12-11 (×2): 125 mL/h via INTRAVENOUS

## 2014-12-10 MED ORDER — ALBUMIN HUMAN 5 % IV SOLN
INTRAVENOUS | Status: DC | PRN
Start: 2014-12-10 — End: 2014-12-10
  Administered 2014-12-10: 15:00:00 via INTRAVENOUS

## 2014-12-10 MED ORDER — INSULIN ASPART 100 UNIT/ML ~~LOC~~ SOLN
0.0000 [IU] | SUBCUTANEOUS | Status: DC
  Administered 2014-12-10: 5 [IU] via SUBCUTANEOUS
  Administered 2014-12-11 (×2): 2 [IU] via SUBCUTANEOUS
  Administered 2014-12-11: 3 [IU] via SUBCUTANEOUS
  Administered 2014-12-11 (×2): 5 [IU] via SUBCUTANEOUS
  Administered 2014-12-11: 3 [IU] via SUBCUTANEOUS
  Administered 2014-12-12 (×3): 2 [IU] via SUBCUTANEOUS
  Administered 2014-12-12 – 2014-12-14 (×7): 3 [IU] via SUBCUTANEOUS
  Administered 2014-12-14 (×2): 2 [IU] via SUBCUTANEOUS
  Administered 2014-12-14: 3 [IU] via SUBCUTANEOUS
  Administered 2014-12-14: 2 [IU] via SUBCUTANEOUS
  Administered 2014-12-14: 3 [IU] via SUBCUTANEOUS
  Administered 2014-12-15 (×5): 2 [IU] via SUBCUTANEOUS
  Administered 2014-12-16 (×6): 3 [IU] via SUBCUTANEOUS
  Administered 2014-12-16: 2 [IU] via SUBCUTANEOUS
  Administered 2014-12-17: 5 [IU] via SUBCUTANEOUS
  Administered 2014-12-17: 3 [IU] via SUBCUTANEOUS
  Administered 2014-12-17: 5 [IU] via SUBCUTANEOUS
  Administered 2014-12-17: 8 [IU] via SUBCUTANEOUS
  Administered 2014-12-17: 5 [IU] via SUBCUTANEOUS
  Administered 2014-12-18: 8 [IU] via SUBCUTANEOUS
  Administered 2014-12-18: 3 [IU] via SUBCUTANEOUS
  Administered 2014-12-18: 8 [IU] via SUBCUTANEOUS
  Administered 2014-12-18: 3 [IU] via SUBCUTANEOUS
  Administered 2014-12-18: 5 [IU] via SUBCUTANEOUS
  Administered 2014-12-18: 8 [IU] via SUBCUTANEOUS
  Administered 2014-12-19 (×6): 3 [IU] via SUBCUTANEOUS
  Administered 2014-12-19: 5 [IU] via SUBCUTANEOUS
  Administered 2014-12-20: 3 [IU] via SUBCUTANEOUS
  Administered 2014-12-20 (×2): 8 [IU] via SUBCUTANEOUS
  Administered 2014-12-20 (×2): 5 [IU] via SUBCUTANEOUS
  Administered 2014-12-21 – 2014-12-22 (×10): 3 [IU] via SUBCUTANEOUS
  Administered 2014-12-22: 2 [IU] via SUBCUTANEOUS
  Administered 2014-12-23 (×2): 3 [IU] via SUBCUTANEOUS

## 2014-12-10 MED ORDER — PROPOFOL 10 MG/ML IV BOLUS
INTRAVENOUS | Status: AC
  Filled 2014-12-10: qty 20

## 2014-12-10 MED ORDER — FENTANYL BOLUS VIA INFUSION
50.0000 ug | INTRAVENOUS | Status: DC | PRN
  Administered 2014-12-12 – 2014-12-15 (×6): 50 ug via INTRAVENOUS
  Filled 2014-12-10: qty 50

## 2014-12-10 MED ORDER — SODIUM BICARBONATE 8.4 % IV SOLN
100.0000 meq | Freq: Once | INTRAVENOUS | Status: AC
  Administered 2014-12-10: 100 meq via INTRAVENOUS
  Filled 2014-12-10: qty 100

## 2014-12-10 MED ORDER — SODIUM CHLORIDE 0.9 % IV SOLN
INTRAVENOUS | Status: DC
  Administered 2014-12-10 (×4): via INTRAVENOUS

## 2014-12-10 MED ORDER — VASOPRESSIN 20 UNIT/ML IV SOLN
INTRAVENOUS | Status: AC
  Filled 2014-12-10: qty 1

## 2014-12-10 MED ORDER — HEPARIN SODIUM (PORCINE) 5000 UNIT/ML IJ SOLN
5000.0000 [IU] | Freq: Three times a day (TID) | INTRAMUSCULAR | Status: DC
  Administered 2014-12-10 – 2014-12-15 (×15): 5000 [IU] via SUBCUTANEOUS
  Filled 2014-12-10 (×22): qty 1

## 2014-12-10 MED ORDER — 0.9 % SODIUM CHLORIDE (POUR BTL) OPTIME
TOPICAL | Status: DC | PRN
  Administered 2014-12-10 (×6): 1000 mL

## 2014-12-10 MED ORDER — CETYLPYRIDINIUM CHLORIDE 0.05 % MT LIQD
7.0000 mL | Freq: Four times a day (QID) | OROMUCOSAL | Status: DC
  Administered 2014-12-10 – 2014-12-25 (×53): 7 mL via OROMUCOSAL

## 2014-12-10 MED ORDER — VECURONIUM BROMIDE 10 MG IV SOLR
INTRAVENOUS | Status: AC
  Filled 2014-12-10: qty 10

## 2014-12-10 MED ORDER — VANCOMYCIN HCL 10 G IV SOLR
1500.0000 mg | INTRAVENOUS | Status: DC
  Administered 2014-12-10: 1500 mg via INTRAVENOUS
  Filled 2014-12-10 (×3): qty 1500

## 2014-12-10 MED ORDER — PROPOFOL 1000 MG/100ML IV EMUL
0.0000 ug/kg/min | INTRAVENOUS | Status: DC
  Administered 2014-12-10: 35 ug/kg/min via INTRAVENOUS
  Administered 2014-12-10: 40 ug/kg/min via INTRAVENOUS
  Administered 2014-12-10: 20 ug/kg/min via INTRAVENOUS
  Administered 2014-12-11: 30 ug/kg/min via INTRAVENOUS
  Administered 2014-12-11: 35 ug/kg/min via INTRAVENOUS
  Administered 2014-12-11 (×2): 30 ug/kg/min via INTRAVENOUS
  Administered 2014-12-11 – 2014-12-13 (×13): 35 ug/kg/min via INTRAVENOUS
  Administered 2014-12-14 (×2): 40 ug/kg/min via INTRAVENOUS
  Administered 2014-12-14 (×2): 35 ug/kg/min via INTRAVENOUS
  Administered 2014-12-14: 40 ug/kg/min via INTRAVENOUS
  Administered 2014-12-14: 35 ug/kg/min via INTRAVENOUS
  Administered 2014-12-15: 30 ug/kg/min via INTRAVENOUS
  Administered 2014-12-15 (×2): 40 ug/kg/min via INTRAVENOUS
  Administered 2014-12-15: 30 ug/kg/min via INTRAVENOUS
  Administered 2014-12-15: 35 ug/kg/min via INTRAVENOUS
  Administered 2014-12-16 (×4): 40 ug/kg/min via INTRAVENOUS
  Administered 2014-12-16 – 2014-12-17 (×2): 30 ug/kg/min via INTRAVENOUS
  Filled 2014-12-10 (×42): qty 100

## 2014-12-10 MED ORDER — PROMETHAZINE HCL 25 MG/ML IJ SOLN: 6.2500 mg | INTRAMUSCULAR | Status: DC | PRN

## 2014-12-10 MED ORDER — LIDOCAINE HCL (CARDIAC) 20 MG/ML IV SOLN
INTRAVENOUS | Status: DC | PRN
  Administered 2014-12-10: 100 mg via INTRAVENOUS

## 2014-12-10 MED ORDER — HYDROMORPHONE HCL 1 MG/ML IJ SOLN: 0.2500 mg | INTRAMUSCULAR | Status: DC | PRN

## 2014-12-10 MED ORDER — SUCCINYLCHOLINE CHLORIDE 20 MG/ML IJ SOLN
INTRAMUSCULAR | Status: AC
  Filled 2014-12-10: qty 1

## 2014-12-10 MED ORDER — SODIUM CHLORIDE 0.9 % IV SOLN
25.0000 ug/h | INTRAVENOUS | Status: DC
  Administered 2014-12-10: 200 ug/h via INTRAVENOUS
  Administered 2014-12-11 – 2014-12-16 (×7): 100 ug/h via INTRAVENOUS
  Filled 2014-12-10 (×9): qty 50

## 2014-12-10 MED ORDER — SODIUM CHLORIDE 0.9 % IV SOLN: 750.0000 mL | INTRAVENOUS | Status: DC | PRN

## 2014-12-10 MED ORDER — PHENYLEPHRINE HCL 10 MG/ML IJ SOLN
10.0000 mg | INTRAMUSCULAR | Status: DC | PRN
  Administered 2014-12-10: 15:00:00 via INTRAVENOUS
  Administered 2014-12-10: 30 ug/min via INTRAVENOUS

## 2014-12-10 MED ORDER — IOHEXOL 300 MG/ML  SOLN
25.0000 mL | INTRAMUSCULAR | Status: AC
  Administered 2014-12-10 (×2): 25 mL via ORAL

## 2014-12-10 MED ORDER — MEPERIDINE HCL 25 MG/ML IJ SOLN: 6.2500 mg | INTRAMUSCULAR | Status: DC | PRN

## 2014-12-10 MED ORDER — SODIUM CHLORIDE 0.9 % IV SOLN: INTRAVENOUS | Status: DC

## 2014-12-10 MED ORDER — FENTANYL CITRATE (PF) 100 MCG/2ML IJ SOLN: 50.0000 ug | Freq: Once | INTRAMUSCULAR | Status: DC

## 2014-12-10 MED ORDER — STERILE WATER FOR INJECTION IJ SOLN
INTRAMUSCULAR | Status: AC
  Filled 2014-12-10: qty 10

## 2014-12-10 MED ORDER — SUCCINYLCHOLINE CHLORIDE 20 MG/ML IJ SOLN
INTRAMUSCULAR | Status: DC | PRN
  Administered 2014-12-10: 200 mg via INTRAVENOUS

## 2014-12-10 MED ORDER — CHLORHEXIDINE GLUCONATE 0.12 % MT SOLN
15.0000 mL | Freq: Two times a day (BID) | OROMUCOSAL | Status: DC
  Administered 2014-12-10 – 2014-12-25 (×28): 15 mL via OROMUCOSAL
  Filled 2014-12-10 (×31): qty 15

## 2014-12-10 MED ORDER — FENTANYL CITRATE (PF) 100 MCG/2ML IJ SOLN
INTRAMUSCULAR | Status: DC | PRN
  Administered 2014-12-10: 100 ug via INTRAVENOUS
  Administered 2014-12-10 (×3): 50 ug via INTRAVENOUS

## 2014-12-10 MED ORDER — MIDAZOLAM HCL 5 MG/5ML IJ SOLN
INTRAMUSCULAR | Status: DC | PRN
  Administered 2014-12-10: 2 mg via INTRAVENOUS

## 2014-12-10 MED ORDER — MIDAZOLAM HCL 2 MG/2ML IJ SOLN
INTRAMUSCULAR | Status: AC
  Filled 2014-12-10: qty 2

## 2014-12-10 MED ORDER — SODIUM CHLORIDE 0.9 % IV SOLN
INTRAVENOUS | Status: DC | PRN
  Administered 2014-12-10: 15:00:00 via INTRAVENOUS

## 2014-12-10 MED ORDER — FENTANYL CITRATE (PF) 250 MCG/5ML IJ SOLN
INTRAMUSCULAR | Status: AC
  Filled 2014-12-10: qty 5

## 2014-12-10 MED ORDER — PROPOFOL 10 MG/ML IV BOLUS
INTRAVENOUS | Status: DC | PRN
  Administered 2014-12-10: 200 mg via INTRAVENOUS

## 2014-12-10 MED ORDER — VECURONIUM BROMIDE 10 MG IV SOLR
INTRAVENOUS | Status: DC | PRN
  Administered 2014-12-10 (×2): 5 mg via INTRAVENOUS

## 2014-12-10 MED ORDER — PANTOPRAZOLE SODIUM 40 MG IV SOLR
40.0000 mg | Freq: Every day | INTRAVENOUS | Status: DC
  Administered 2014-12-10 – 2014-12-24 (×15): 40 mg via INTRAVENOUS
  Filled 2014-12-10 (×20): qty 40

## 2014-12-10 SURGICAL SUPPLY — 61 items
BLADE SURG ROTATE 9660 (MISCELLANEOUS) IMPLANT
CANISTER SUCTION 2500CC (MISCELLANEOUS) ×3 IMPLANT
CANISTER WOUND CARE 500ML ATS (WOUND CARE) ×2 IMPLANT
CHLORAPREP W/TINT 26ML (MISCELLANEOUS) ×3 IMPLANT
COVER MAYO STAND STRL (DRAPES) ×2 IMPLANT
COVER SURGICAL LIGHT HANDLE (MISCELLANEOUS) ×3 IMPLANT
DRAPE LAPAROSCOPIC ABDOMINAL (DRAPES) ×3 IMPLANT
DRAPE PROXIMA HALF (DRAPES) IMPLANT
DRAPE UTILITY XL STRL (DRAPES) ×6 IMPLANT
DRAPE WARM FLUID 44X44 (DRAPE) ×3 IMPLANT
DRSG OPSITE POSTOP 4X10 (GAUZE/BANDAGES/DRESSINGS) IMPLANT
DRSG OPSITE POSTOP 4X8 (GAUZE/BANDAGES/DRESSINGS) IMPLANT
ELECT BLADE 6.5 EXT (BLADE) ×2 IMPLANT
ELECT CAUTERY BLADE 6.4 (BLADE) ×6 IMPLANT
ELECT REM PT RETURN 9FT ADLT (ELECTROSURGICAL) ×3
ELECTRODE REM PT RTRN 9FT ADLT (ELECTROSURGICAL) ×1 IMPLANT
GLOVE BIOGEL PI IND STRL 6.5 (GLOVE) IMPLANT
GLOVE BIOGEL PI IND STRL 7.5 (GLOVE) ×1 IMPLANT
GLOVE BIOGEL PI IND STRL 8 (GLOVE) IMPLANT
GLOVE BIOGEL PI INDICATOR 6.5 (GLOVE) ×2
GLOVE BIOGEL PI INDICATOR 7.5 (GLOVE) ×6
GLOVE BIOGEL PI INDICATOR 8 (GLOVE) ×4
GLOVE SURG SS PI 6.5 STRL IVOR (GLOVE) ×2 IMPLANT
GLOVE SURG SS PI 7.5 STRL IVOR (GLOVE) ×6 IMPLANT
GOWN STRL REUS W/ TWL LRG LVL3 (GOWN DISPOSABLE) ×3 IMPLANT
GOWN STRL REUS W/TWL LRG LVL3 (GOWN DISPOSABLE) ×18
KIT BASIN OR (CUSTOM PROCEDURE TRAY) ×3 IMPLANT
KIT ROOM TURNOVER OR (KITS) ×3 IMPLANT
LIGASURE IMPACT 36 18CM CVD LR (INSTRUMENTS) ×2 IMPLANT
NS IRRIG 1000ML POUR BTL (IV SOLUTION) ×14 IMPLANT
PACK GENERAL/GYN (CUSTOM PROCEDURE TRAY) ×3 IMPLANT
PAD ARMBOARD 7.5X6 YLW CONV (MISCELLANEOUS) ×3 IMPLANT
PENCIL BUTTON HOLSTER BLD 10FT (ELECTRODE) IMPLANT
RELOAD PROXIMATE 75MM BLUE (ENDOMECHANICALS) ×6 IMPLANT
RELOAD STAPLE 75 3.8 BLU REG (ENDOMECHANICALS) IMPLANT
RELOAD STAPLER LINEAR PROX 30 (STAPLE) ×1 IMPLANT
SPECIMEN JAR LARGE (MISCELLANEOUS) ×2 IMPLANT
SPECIMEN JAR SMALL (MISCELLANEOUS) ×2 IMPLANT
SPONGE ABDOMINAL VAC ABTHERA (MISCELLANEOUS) ×2 IMPLANT
SPONGE LAP 18X18 X RAY DECT (DISPOSABLE) ×8 IMPLANT
STAPLER PROXIMATE 75MM BLUE (STAPLE) ×2 IMPLANT
STAPLER RELOAD LINEAR PROX 30 (STAPLE) ×3
STAPLER RELOADABLE 30 BLU REG (STAPLE) IMPLANT
STAPLER VISISTAT 35W (STAPLE) ×1 IMPLANT
SUCTION POOLE TIP (SUCTIONS) ×3 IMPLANT
SUT PDS AB 1 TP1 96 (SUTURE) ×2 IMPLANT
SUT SILK 2 0 (SUTURE) ×3
SUT SILK 2 0 SH CR/8 (SUTURE) ×3 IMPLANT
SUT SILK 2-0 18XBRD TIE 12 (SUTURE) ×1 IMPLANT
SUT SILK 3 0 (SUTURE) ×3
SUT SILK 3 0 SH CR/8 (SUTURE) ×3 IMPLANT
SUT SILK 3-0 18XBRD TIE 12 (SUTURE) ×1 IMPLANT
SUT VIC AB 3-0 SH 27 (SUTURE)
SUT VIC AB 3-0 SH 27X BRD (SUTURE) IMPLANT
SWAB COLLECTION DEVICE MRSA (MISCELLANEOUS) ×2 IMPLANT
TOWEL OR 17X26 10 PK STRL BLUE (TOWEL DISPOSABLE) ×3 IMPLANT
TRAY FOLEY CATH 16FRSI W/METER (SET/KITS/TRAYS/PACK) IMPLANT
TUBE ANAEROBIC SPECIMEN COL (MISCELLANEOUS) ×2 IMPLANT
TUBE CONNECTING 12'X1/4 (SUCTIONS)
TUBE CONNECTING 12X1/4 (SUCTIONS) IMPLANT
YANKAUER SUCT BULB TIP NO VENT (SUCTIONS) ×2 IMPLANT

## 2014-12-10 NOTE — Progress Notes (Signed)
PROGRESS NOTE  Allison [name] ZOX:[id number] DOB: 05-Aug-1960 DOA: 12/07/2014 PCP: No PCP Per Patient  HPI/Recap of past 10 hours: 54 year old female with past medical history of uncontrolled diabetes mellitus, CAD and morbid obesity admitted to surgical service on 6/14 for sepsis secondary to perforated appendix. Hospitalists consulted for management of medical issues. Patient placed in surgical ICU noted to be hypotensive and started on IV fluids plus anti-biotics  Past several days, Patient's hypotension responding to aggressive IV fluids with high rate plus boluses. Creatinine continues to improve. Despite antibiotics, patient's white count increased today. After discussion, surgery has decided to take patient to operating room.  Assessment/Plan:   DM type 2, uncontrolled, with renal complications: Continue every 4 hours sliding scale sensitive.    CORONARY ATHEROSCLEROSIS, NATIVE VESSEL: Stable.    Severe sepsis with acute organ dysfunction secondary to perforated appendix.: Patient is criteria given lactic acid level, markedly leukocytosis, organ dysfunction, hypotension and tachycardia with appendix as source. Plan is to go to operating room today    Acute kidney failure, in the setting of possible chronic kidney disease. Several years ago, renal function normal, but no in between labs since. Continue aggressive hydration and will likely be able to determine long-term renal function and see if full kidney function is restored    Hypokalemia: Secondary to volume resuscitation plus hyperglycemia. Replacing as needed.    Morbid obesity: Patient is criteria with BMI greater than 40   Code Status: Full code  Family Communication: No family present  Disposition Plan: Continue surgical stepdown.   Consultants:  hospitalists  Procedures:  None  Antibiotics:  IV flagyl 6/15-present  IV cipro 6/15-present  IV zosyn x one 6/15:pre-op   Objective: BP 117/84 mmHg  Pulse  83  Temp(Src) 97.7 F (36.5 C) (Oral)  Resp 20  Ht  (1.6 m)  Wt 127.37 kg (280 lb 12.8 oz)  BMI 49.75 kg/m2  SpO2 100%  Intake/Output Summary (Last 24 hours) at 12/10/14 1327 Last data filed at 12/10/14 1200  Gross per 24 hour  Intake   2900 ml  Output   1840 ml  Net   1060 ml   Filed Weights   12/07/14 1918 12/08/14 0645  Weight: 127.37 kg (280 lb 12.8 oz) 127.37 kg (280 lb 12.8 oz)    Exam:   General:  Alert & oriented x 3, distress from abdominal pain  Cardiovascular: Regular rate and rhythm, S1-S2  Respiratory: Clear to auscultation bilaterally  Abdomen: Obese, generalized tenderness, scant bowel sounds  Musculoskeletal: No edema  Data Reviewed: Basic Metabolic Panel:  Recent Labs Lab 12/07/14 1930 12/08/14 0820 12/08/14 1057 12/09/14 0410 12/10/14 0340  NA 129* 131* 131* 134* 134*  K 2.9* 3.8 3.0* 3.3* 4.2  CL 96* 103 103 107 108  CO2 19* 17* 21* 18* 18*  GLUCOSE 183* 150* 174* 127* 217*  BUN 41* 45* 45* 42* 33*  CREATININE 2.33* 2.83* 2.66* 2.00* 1.63*  CALCIUM 8.3* 7.0* 6.7* 6.8* 6.9*   Liver Function Tests:  Recent Labs Lab 12/07/14 1930 12/08/14 0820  AST 22 22  ALT 18 16  ALKPHOS 65 54  BILITOT 0.8 0.5  PROT 6.8 5.3*  ALBUMIN 2.9* 2.1*    Recent Labs Lab 12/07/14 1930  LIPASE <10*   No results for input(s): AMMONIA in the last 168 hours. CBC:  Recent Labs Lab 12/07/14 1930 12/08/14 0820 12/09/14 0410 12/10/14 0340  WBC 15.4* 13.8* 12.5* 16.1*  NEUTROABS 13.7*  --   --   --  HGB 13.8 11.6* 10.9* 11.4*  HCT 39.5 33.1* 33.3* 35.3*  MCV 87.8 86.9 89.3 91.7  PLT 278 233 281 332   Cardiac Enzymes:    Recent Labs Lab 12/08/14 0507 12/08/14 1057 12/08/14 1800  TROPONINI 0.06* <0.03 <0.03   BNP (last 3 results) No results for input(s): BNP in the last 8760 hours.  ProBNP (last 3 results) No results for input(s): PROBNP in the last 8760 hours.  CBG:  Recent Labs Lab 12/09/14 1610 12/09/14 2023  12/09/14 2355 12/10/14 0453 12/10/14 0733  GLUCAP 145* 156* 181* 205* 177*    No results found for this or any previous visit (from the past 240 hour(s)).   Studies: No results found.  Scheduled Meds: . antiseptic oral rinse  7 mL Mouth Rinse q12n4p  . chlorhexidine  15 mL Mouth Rinse BID  . diphenhydrAMINE  12.5 mg Intravenous Q24H  . enoxaparin (LOVENOX) injection  40 mg Subcutaneous Q24H  . ertapenem  1 g Intravenous Q24H  . insulin aspart  0-9 Units Subcutaneous 6 times per day  . ondansetron      . sodium chloride  1,000 mL Intravenous Once  . sodium chloride  10-40 mL Intracatheter Q12H    Continuous Infusions: . sodium chloride 10 mL/hr at 12/10/14 1227  . dextrose 5 % and 0.45 % NaCl with KCl 20 mEq/L 125 mL/hr at 12/10/14 1200     Time spent: 15 minutes  Hollice Espy  [facility] Pager [phone]. If 7PM-7AM, please contact night-coverage at [email or web address], password [facility] 12/10/2014, 1:27 PM  LOS: 2 days

## 2014-12-10 NOTE — Anesthesia Procedure Notes (Addendum)
Procedure Name: Intubation Date/Time: 12/10/2014 1:20 PM Performed by: Romie Minus K Pre-anesthesia Checklist: Patient identified, Emergency Drugs available, Suction available, Patient being monitored and Timeout performed Patient Re-evaluated:Patient Re-evaluated prior to inductionOxygen Delivery Method: Circle system utilized Preoxygenation: Pre-oxygenation with 100% oxygen Intubation Type: IV induction, Rapid sequence and Cricoid Pressure applied Grade View: Grade I Tube type: Oral Tube size: 7.0 mm Number of attempts: 1 Airway Equipment and Method: Stylet and Video-laryngoscopy Placement Confirmation: ETT inserted through vocal cords under direct vision,  positive ETCO2,  CO2 detector and breath sounds checked- equal and bilateral Secured at: 22 cm Tube secured with: Tape Dental Injury: Teeth and Oropharynx as per pre-operative assessment    Procedure Name: Intubation Date/Time: 12/10/2014 2:43 PM Performed by: Romie Minus K Pre-anesthesia Checklist: Patient identified, Emergency Drugs available, Suction available, Patient being monitored and Timeout performed Patient Re-evaluated:Patient Re-evaluated prior to inductionPlacement Confirmation: ETT inserted through vocal cords under direct vision,  positive ETCO2,  CO2 detector and breath sounds checked- equal and bilateral Secured at: 21 cm Tube secured with: Tape Dental Injury: Teeth and Oropharynx as per pre-operative assessment  Comments: ETT tube exchange. 7.0 oral ETT exchanged for 7.5 oral subglottic secretion tube over bougie stylet. Video-glide assisted for visualization.

## 2014-12-10 NOTE — Progress Notes (Signed)
Patient ID: Allison [name], female   DOB: [birth year], 54 y.o.   MRN: [id number] She clinically is worse than this am. She has had bilious emesis, more abdominal pain on exam as well. With wbc up I think we should cancel ct and plan for laparotomy given obstruction and symptoms.  I discussed laparotomy, appendectomy, possible bowel resection, likely open wound. Risks discussed with patient as well as in a call to her husband Jeidy Levitan.  Will proceed asap

## 2014-12-10 NOTE — Progress Notes (Signed)
ANTIBIOTIC CONSULT NOTE - INITIAL  Pharmacy Consult for Vancomycin Indication: abdominal infection / sepsis / peritonitis  Allergies  Allergen Reactions  . Sulfonamide Derivatives Anaphylaxis  . Aspirin Nausea And Vomiting  . Penicillins Hives  . Latex Rash    Patient Measurements: Height: 5\' 3"  (160 cm) Weight: 280 lb 12.8 oz (127.37 kg) IBW/kg (Calculated) : 52.4  Vital Signs: Temp: 97.7 F (36.5 C) (06/17 0735) Temp Source: Oral (06/17 0735) BP: 117/84 mmHg (06/17 1200) Pulse Rate: 83 (06/17 1200) Intake/Output from previous day: 06/16 0701 - 06/17 0700 In: 3075 [I.V.:2625; IV Piggyback:450] Out: 1685 [Urine:1685] Intake/Output from this shift: Total I/O In: 1800 [I.V.:1500; IV Piggyback:300] Out: 2320 [Urine:520; Emesis/NG output:700; Other:1000; Blood:100]  Labs:  Recent Labs  12/08/14 0820 12/08/14 1057 12/09/14 0410 12/10/14 0340  WBC 13.8*  --  12.5* 16.1*  HGB 11.6*  --  10.9* 11.4*  PLT 233  --  281 332  CREATININE 2.83* 2.66* 2.00* 1.63*   Estimated Creatinine Clearance: 51.9 mL/min (by C-G formula based on Cr of 1.63). No results for input(s): VANCOTROUGH, VANCOPEAK, VANCORANDOM, GENTTROUGH, GENTPEAK, GENTRANDOM, TOBRATROUGH, TOBRAPEAK, TOBRARND, AMIKACINPEAK, AMIKACINTROU, AMIKACIN in the last 72 hours.   Microbiology: No results found for this or any previous visit (from the past 720 hour(s)).  Medical History: Past Medical History  Diagnosis Date  . Non-ST elevated myocardial infarction (non-STEMI)   . Dysphasia   . Hypotension   . Hypokalemia   . Non-insulin dependent diabetes mellitus   . Hyperlipidemia   . Migraine headache   . Hemorrhoids   . History of MRSA infection   . Major depressive disorder   . Bipolar disorder     Assessment: 54 year old female to begin vancomycin for sepsis in setting of peritonitis due to a ruptured appendix  Goal of Therapy:  Vancomycin trough level 15-20 mcg/ml  Plan:  Continues on Ertapenem   Vancomycin 1500 mg iv Q 24 hours Follow up cultures, Scr, fever curve  Thank you. Okey Regal, PharmD [phone]  12/10/2014,3:56 PM

## 2014-12-10 NOTE — Anesthesia Preprocedure Evaluation (Addendum)
Anesthesia Evaluation  Patient identified by MRN, date of birth, ID band Patient awake    Reviewed: Allergy & Precautions, NPO status , Patient's Chart, lab work & pertinent test results  Airway Mallampati: III  TM Distance: <3 FB Neck ROM: Full    Dental no notable dental hx.    Pulmonary former smoker,  breath sounds clear to auscultation  Pulmonary exam normal       Cardiovascular + CAD and + Past MI Normal cardiovascular examRhythm:Regular Rate:Normal  Echo 2011 - Left ventricle: The cavity size was normal. Wall thickness was borderline. Systolic function was normal. The estimated ejection fraction was in the range of 55% to 60%. Wall motion was normal; there were no regional wall motion abnormalities. - Right ventricle: The LVwas borderline dilated. Wall thickness was upper normal.    Neuro/Psych  Headaches, PSYCHIATRIC DISORDERS Depression Bipolar Disorder    GI/Hepatic negative GI ROS, Neg liver ROS,   Endo/Other  diabetes, Type 2, Oral Hypoglycemic AgentsMorbid obesity  Renal/GU Renal disease     Musculoskeletal negative musculoskeletal ROS (+)   Abdominal (+) + obese,   Peds  Hematology negative hematology ROS (+)   Anesthesia Other Findings   Reproductive/Obstetrics negative OB ROS                        Anesthesia Physical Anesthesia Plan  ASA: IV and emergent  Anesthesia Plan: General   Post-op Pain Management:    Induction: Intravenous, Rapid sequence and Cricoid pressure planned  Airway Management Planned: Oral ETT and Video Laryngoscope Planned  Additional Equipment: None  Intra-op Plan:   Post-operative Plan: Possible Post-op intubation/ventilation  Informed Consent: I have reviewed the patients History and Physical, chart, labs and discussed the procedure including the risks, benefits and alternatives for the proposed anesthesia with the patient or authorized  representative who has indicated his/her understanding and acceptance.   Dental advisory given  Plan Discussed with: CRNA  Anesthesia Plan Comments: (+/- a line)      Anesthesia Quick Evaluation

## 2014-12-10 NOTE — Progress Notes (Signed)
Subjective: She has had bm now, she says feels miserable but is sleeping this am, no fevers, bp much better, cr improved  Objective: Vital signs in last 24 hours: Temp:  [98.1 F (36.7 C)-99.8 F (37.7 C)] 98.8 F (37.1 C) (06/17 0400) Pulse Rate:  [80-95] 86 (06/17 0700) Resp:  [12-22] 20 (06/17 0700) BP: (90-125)/(30-69) 123/68 mmHg (06/17 0700) SpO2:  [89 %-100 %] 89 % (06/17 0700) Last BM Date: 12/09/14  Intake/Output from previous day: 06/16 0701 - 06/17 0700 In: 3075 [I.V.:2625; IV Piggyback:450] Out: 1685 [Urine:1685] Intake/Output this shift:    General appearance: no distress Resp: diminished breath sounds bibasilar Cardio: regular rate and rhythm GI: soft few bs heard nontender  Lab Results:   Recent Labs  12/09/14 0410 12/10/14 0340  WBC 12.5* 16.1*  HGB 10.9* 11.4*  HCT 33.3* 35.3*  PLT 281 332   BMET  Recent Labs  12/09/14 0410 12/10/14 0340  NA 134* 134*  K 3.3* 4.2  CL 107 108  CO2 18* 18*  GLUCOSE 127* 217*  BUN 42* 33*  CREATININE 2.00* 1.63*  CALCIUM 6.8* 6.9*   PT/INR No results for input(s): LABPROT, INR in the last 72 hours. ABG No results for input(s): PHART, HCO3 in the last 72 hours.  Invalid input(s): PCO2, PO2  Studies/Results: No results found.  Anti-infectives: Anti-infectives    Start     Dose/Rate Route Frequency Ordered Stop   12/09/14 1100  ertapenem (INVANZ) 1 g in sodium chloride 0.9 % 50 mL IVPB  Status:  Discontinued    Comments:  She has hives from pcn before, the cross reactivity is low and I think there is some benefit to hopefully avoiding surgery in this patient without other good choices for abx, I think cipro/flagyl has too much resistance and she has perforation   1 g 100 mL/hr over 30 Minutes Intravenous Every 24 hours 12/09/14 0940 12/09/14 1017   12/09/14 1100  ertapenem (INVANZ) 1 g in sodium chloride 0.9 % 50 mL IVPB  Status:  Discontinued    Comments:  She has hives from pcn before, the cross  reactivity is low and I think there is some benefit to hopefully avoiding surgery in this patient without other good choices for abx, I think cipro/flagyl has too much resistance and she has perforation   1 g 100 mL/hr over 30 Minutes Intravenous Every 24 hours 12/09/14 1017 12/09/14 1018   12/09/14 1100  ertapenem (INVANZ) 1 g in sodium chloride 0.9 % 50 mL IVPB    Comments:  She has hives from pcn before, the cross reactivity is low and I think there is some benefit to hopefully avoiding surgery in this patient without other good choices for abx, I think cipro/flagyl has too much resistance and she has perforation   1 g 100 mL/hr over 30 Minutes Intravenous Every 24 hours 12/09/14 1020     12/09/14 0945  ertapenem (INVANZ) injection 1 g  Status:  Discontinued    Comments:  She has hives from pcn before, the cross reactivity is low and I think there is some benefit to hopefully avoiding surgery in this patient without other good choices for abx, I think cipro/flagyl has too much resistance and she has perforation   1 g Intramuscular Every 24 hours 12/09/14 0937 12/09/14 0941   12/08/14 0800  ciprofloxacin (CIPRO) IVPB 400 mg  Status:  Discontinued     400 mg 200 mL/hr over 60 Minutes Intravenous Every 12 hours 12/08/14 3202  12/09/14 0933   12/08/14 0800  metroNIDAZOLE (FLAGYL) IVPB 500 mg  Status:  Discontinued     500 mg 100 mL/hr over 60 Minutes Intravenous Every 8 hours 12/08/14 0652 12/09/14 0933   12/08/14 0330  piperacillin-tazobactam (ZOSYN) IVPB 3.375 g     3.375 g 100 mL/hr over 30 Minutes Intravenous  Once 12/08/14 0328 12/08/14 0513      Assessment/Plan: Perforated appendicitis ARI  1. Neuro- cont iv pain meds 2. cv- bp much better after volume 3. pulm toilet 4. GI- clinically appears improved, hard to do abd exam due to size, is now passing stool and some gas, not tender, but wbc up, will get ct today again to follow up but cannot give iv contrast 5. Renal- cr improving,  cont foley for now for monitoring 6. Continue invanz, hopefully she continues to improve to discuss lap appy in future, will f/u after ct scan  .  [facility] 12/10/2014

## 2014-12-10 NOTE — Transfer of Care (Signed)
Immediate Anesthesia Transfer of Care Note  Patient: Allison [name]  Procedure(s) Performed: Procedure(s): EXPLORATORY LAPAROTOMY, APPENDECTOMY, SMALL BOWEL RESECTION, WOUND VAC PLACEMENT (N/A)  Patient Location: ICU  Anesthesia Type:General  Level of Consciousness: sedated, unresponsive and Patient remains intubated per anesthesia plan  Airway & Oxygen Therapy: Patient remains intubated per anesthesia plan and Patient placed on Ventilator (see vital sign flow sheet for setting)  Post-op Assessment: Report given to RN and Post -op Vital signs reviewed and stable  Post vital signs: Reviewed  Last Vitals:  Filed Vitals:   12/10/14 1200  BP: 117/84  Pulse: 83  Temp:   Resp: 20    Complications: No apparent anesthesia complications

## 2014-12-10 NOTE — Progress Notes (Signed)
eLink Physician-Brief Progress Note Patient Name: Allison [name] DOB: 03-Jun-1961 MRN: [id number]   Date of Service  12/10/2014  HPI/Events of Note  Persistent hyperglycemia on sensitive scale.  eICU Interventions  Plan: Change to q4 hours SSI moderate scale     Intervention Category Intermediate Interventions: Hyperglycemia - evaluation and treatment  Gerasimos Plotts 12/10/2014, 11:46 PM

## 2014-12-10 NOTE — Progress Notes (Signed)
Inpatient Diabetes Program Recommendations  AACE/ADA: New Consensus Statement on Inpatient Glycemic Control (2013)  Target Ranges:  Prepandial:   less than 140 mg/dL      Peak postprandial:   less than 180 mg/dL (1-2 hours)      Critically ill patients:  140 - 180 mg/dL   Reason for Visit: Hyperglycemia  Diabetes history: DM2 Outpatient Diabetes medications: Lantus 110 units qAM, glipizide 10 mg QAM, metformin 1000 mg bid Current orders for Inpatient glycemic control: Novolog sensitive Q4H  Fairly good control with no basal insulin.  Inpatient Diabetes Program Recommendations Insulin - Basal: Add Lantus 25 units QAM Insulin - Meal Coverage: When diet is advanced add Novolog 8 units tidwc for meal coverage insulin HgbA1C: 6% - indicates good control at home Diet: NPO - when advanced CHO mod med  Note: ? Extremely high insulin doses at home. Will discuss diet with pt this afternoon. Will order Living Well With Diabetes book for pt.  Thank you. Ailene Ards, RD, LDN, CDE Inpatient Diabetes Coordinator [phone]

## 2014-12-10 NOTE — Progress Notes (Signed)
eLink Physician-Brief Progress Note Patient Name: Allison [name] DOB: 1961-04-03 MRN: [id number]   Date of Service  12/10/2014  HPI/Events of Note  ABG on 50%/PRVC 20/TV 450/P 8 = 7.27/36/85  eICU Interventions  Will increase TV to 500 mL and recheck ABG at 8:30 PM.      Intervention Category Major Interventions: Respiratory failure - evaluation and management  Lenell Antu 12/10/2014, 7:17 PM

## 2014-12-10 NOTE — Consult Note (Signed)
PULMONARY / CRITICAL CARE MEDICINE   Name: Allison [name] MRN: [id number] DOB: 1961/03/11    ADMISSION DATE:  12/07/2014 CONSULTATION DATE:  6/17  REFERRING MD :  Dwain Sarna   CHIEF COMPLAINT:   Severe sepsis Assist w/ vent management   INITIAL PRESENTATION:   83 yof who was admitted 6/14  W/ 2 night h/o perforated appendicitis. She appeared to improve initially with abx and resuscitation Then on 6/17 she had more pain, emesis, no more bowel function and her abdomen was more tender. Her wbc was rising. She went for emergent exploration, found to have severe peritonitis as well as evidence of bowel ischemia. She underwent appendectomy, SB resection and was left in discontinuity. Returned to the ICU on vent. PCCM asked to assist w/ care.   STUDIES:  CT abd/pelvis 6/15: Inflammatory process demonstrated throughout the pelvis with fluid and infiltration in the low pelvis and mesenteric. At appendicolith with abnormal appearing appendix, likely this represents perforated appendicitis. No discrete abscess is identified. There is small bowel wall thickening of the pelvic loops with proximal small bowel obstruction.  SIGNIFICANT EVENTS:    HISTORY OF PRESENT ILLNESS:    46 yof who was admitted 6/14  W/ 2 night h/o perforated appendicitis. She appeared to improve initially with abx and resuscitation with creatinine going down, no more fevers and wbc improving. She had some bowel function. Then on 6/17 she had more pain, emesis, no more bowel function and her abdomen was more tender. Her wbc was rising. She went for emergent exploration, found to have severe peritonitis as well as evidence of bowel ischemia. She underwent appendectomy, SB resection and was left in discontinuity. Returned to the ICU on vent. PCCM asked to assist w/ care.     PAST MEDICAL HISTORY :   has a past medical history of Non-ST elevated myocardial infarction (non-STEMI); Dysphasia; Hypotension; Hypokalemia; Non-insulin  dependent diabetes mellitus; Hyperlipidemia; Migraine headache; Hemorrhoids; History of MRSA infection; Major depressive disorder; and Bipolar disorder.  has past surgical history that includes Knee arthroscopy (1996); Endometrial ablation (2009); Esophageal dilation (2006); and Cardiac catheterization (01/03/2010). Prior to Admission medications   Medication Sig Start Date End Date Taking? Authorizing Provider  acetaminophen (TYLENOL) 500 MG tablet Take 1,000 mg by mouth every 6 (six) hours as needed for mild pain.    Yes Historical Provider, MD  albuterol (PROVENTIL HFA;VENTOLIN HFA) 108 (90 BASE) MCG/ACT inhaler Inhale 2 puffs into the lungs every 6 (six) hours as needed for wheezing or shortness of breath.   Yes Historical Provider, MD  aspirin EC 81 MG tablet Take 81 mg by mouth every other day.   Yes Historical Provider, MD  atorvastatin (LIPITOR) 10 MG tablet Take 10 mg by mouth daily.   Yes Historical Provider, MD  buPROPion (WELLBUTRIN XL) 150 MG 24 hr tablet Take 1 tablet (150 mg total) by mouth daily. 09/09/14  Yes Cleotis Nipper, MD  Choline Fenofibrate (TRILIPIX) 135 MG capsule Take 135 mg by mouth daily.     Yes Historical Provider, MD  cilostazol (PLETAL) 100 MG tablet Take 100 mg by mouth 2 (two) times daily.   Yes Historical Provider, MD  clopidogrel (PLAVIX) 75 MG tablet Take 75 mg by mouth daily.     Yes Historical Provider, MD  ezetimibe (ZETIA) 10 MG tablet Take 10 mg by mouth daily.   Yes Historical Provider, MD  furosemide (LASIX) 20 MG tablet Take 20 mg by mouth 2 (two) times daily.   Yes Historical Provider,  MD  HYDROcodone-acetaminophen (NORCO) 10-325 MG per tablet Take 1 tablet by mouth every 6 (six) hours as needed for moderate pain.  05/18/14  Yes Historical Provider, MD  insulin glargine (LANTUS) 100 UNIT/ML injection Inject 110 Units into the skin at bedtime.    Yes Historical Provider, MD  insulin glulisine (APIDRA) 100 UNIT/ML injection Inject 50 Units into the skin 3  (three) times daily.    Yes Historical Provider, MD  lubiprostone (AMITIZA) 8 MCG capsule Take 8 mcg by mouth 2 (two) times daily with a meal.   Yes Historical Provider, MD  nitroGLYCERIN (NITROSTAT) 0.4 MG SL tablet Place 0.4 mg under the tongue every 5 (five) minutes as needed for chest pain. May repeat for up to 3 doses.   Yes Historical Provider, MD  Olmesartan-Amlodipine-HCTZ (TRIBENZOR) 40-5-25 MG TABS Take 1 tablet by mouth daily.    Yes Historical Provider, MD  omeprazole (PRILOSEC) 40 MG capsule Take 40 mg by mouth 2 (two) times daily.   Yes Historical Provider, MD  PARoxetine (PAXIL-CR) 37.5 MG 24 hr tablet Take 1 tablet (37.5 mg total) by mouth daily. 09/09/14  Yes Cleotis Nipper, MD  potassium chloride SA (K-DUR,KLOR-CON) 20 MEQ tablet Take 20 mEq by mouth daily.   Yes Historical Provider, MD  ranolazine (RANEXA) 1000 MG SR tablet Take 500 mg by mouth 2 (two) times daily.   Yes Historical Provider, MD  sitaGLIPtin (JANUVIA) 100 MG tablet Take 100 mg by mouth daily.     Yes Historical Provider, MD  Vitamin D, Ergocalciferol, (DRISDOL) 50000 UNITS CAPS capsule Take 50,000 Units by mouth every 7 (seven) days.   Yes Historical Provider, MD   Allergies  Allergen Reactions  . Sulfonamide Derivatives Anaphylaxis  . Aspirin Nausea And Vomiting  . Penicillins Hives  . Latex Rash    FAMILY HISTORY:  indicated that her mother is deceased. She indicated that her sister is alive.  SOCIAL HISTORY:  reports that she has quit smoking. She has never used smokeless tobacco. She reports that she does not drink alcohol or use illicit drugs.  REVIEW OF SYSTEMS: unable   VITAL SIGNS: Temp:  [97.7 F (36.5 C)-99.8 F (37.7 C)] 97.7 F (36.5 C) (06/17 0735) Pulse Rate:  [83-95] 83 (06/17 1200) Resp:  [12-22] 20 (06/17 1200) BP: (90-125)/(30-84) 117/84 mmHg (06/17 1200) SpO2:  [89 %-100 %] 100 % (06/17 1200) HEMODYNAMICS:   VENTILATOR SETTINGS:   INTAKE / OUTPUT:  Intake/Output Summary  (Last 24 hours) at 12/10/14 1512 Last data filed at 12/10/14 1430  Gross per 24 hour  Intake   3050 ml  Output   2840 ml  Net    210 ml    PHYSICAL EXAMINATION: General:  Sedated on vent  Neuro:  Sedated on vent  HEENT:  Orally intubated. Neck large  Cardiovascular:  rrr Lungs:  Decreased bases  Abdomen:  Obese, vac dressing intact  Musculoskeletal:  Intact  Skin:  Intact   LABS:  CBC  Recent Labs Lab 12/08/14 0820 12/09/14 0410 12/10/14 0340  WBC 13.8* 12.5* 16.1*  HGB 11.6* 10.9* 11.4*  HCT 33.1* 33.3* 35.3*  PLT 233 281 332   Coag's No results for input(s): APTT, INR in the last 168 hours. BMET  Recent Labs Lab 12/08/14 1057 12/09/14 0410 12/10/14 0340  NA 131* 134* 134*  K 3.0* 3.3* 4.2  CL 103 107 108  CO2 21* 18* 18*  BUN 45* 42* 33*  CREATININE 2.66* 2.00* 1.63*  GLUCOSE 174* 127* 217*  Electrolytes  Recent Labs Lab 12/08/14 1057 12/09/14 0410 12/10/14 0340  CALCIUM 6.7* 6.8* 6.9*   Sepsis Markers  Recent Labs Lab 12/08/14 0328  LATICACIDVEN 1.15   ABG No results for input(s): PHART, PCO2ART, PO2ART in the last 168 hours. Liver Enzymes  Recent Labs Lab 12/07/14 1930 12/08/14 0820  AST 22 22  ALT 18 16  ALKPHOS 65 54  BILITOT 0.8 0.5  ALBUMIN 2.9* 2.1*   Cardiac Enzymes  Recent Labs Lab 12/08/14 0507 12/08/14 1057 12/08/14 1800  TROPONINI 0.06* <0.03 <0.03   Glucose  Recent Labs Lab 12/09/14 1149 12/09/14 1610 12/09/14 2023 12/09/14 2355 12/10/14 0453 12/10/14 0733  GLUCAP 136* 145* 156* 181* 205* 177*    Imaging No results found.   ASSESSMENT / PLAN:  PULMONARY OETT A: Acute respiratory failure in setting of shock Probable OSA  P:   Full vent support  PAD protocol  F/u abg and cxr Will keep ventilated over weekend as scheduled to go back to OR   CARDIOVASCULAR CVL 6/17 PICC >>> A:  Severe sepsis in setting of peritonitis  HTN [place] in OR P:  Ck lactic acid Repeat CBC on arrival to  ICU See ID section  Ck CVP  MAP goal >65 CVP goal 8-12  Repeat chemistry and mg   RENAL A:   AKI-->had been improving  NAG metabolic acidosis   P:   MIVF w/ LR Avoid hypotension  Strict I&O Foley cath   GASTROINTESTINAL A:   Ruptured appendix Peritonitis  Ischemic bowel Now s/p expl lap, appendectomy, SB resection, and placement of wound vac 6/17; bowel left in discontinuity  P:  Nutrition per surg. Anticipate post-op ileus as bowel left in discontinuity  Wound care per surg  For return to OR in next 48-72 hrs  HEMATOLOGIC A:  Anemia  P:  Trend CBC [place] heparin  xfuse for hgb < 7   INFECTIOUS A:   Sepsis Peritonitis in setting of ruptured appendix  Possible bacterial translocation d/t ischemic bowel  P:   6/17 body fluid culture>>> invanz 6/15>>>  ENDOCRINE A:   Type II DM Hyperglycemia  P:   q4h ssi   NEUROLOGIC A:   Acute encephalopathy  Pain  P:   RASS goal: -2 PAD protocol will use prop and fent    FAMILY  - Updates: pending   - Inter-disciplinary family meet or Palliative Care meeting due by: 6/23    TODAY'S SUMMARY:  Peritonitis from ruptured appendix and ischemic bowel. Returns to ICU on vent. She was left in discontinuity w/ plan to re-explore in next 48-72 hours. We will ensure hemodynamic stability, cont supportive care, and mechanical ventilation. Will leave her intubated until after her re-exploration.    Simonne Martinet ACNP-BC [facility] Pulmonary/Critical Care Pager # [phone] OR # [phone] if no answer   12/10/2014, 3:12 PM   PCCM ATTENDING: I have reviewed pt's initial presentation, consultants notes and hospital database in detail.  The above assessment and plan was formulated under my direction.  In summary: Obesity Ruptured appendix Severe peritonitis S/P laparotomy 6/17  Abd left open Hypertension   All orders have been reviewed Plan is as outlined above Repeat laparotomy planned for 6/19    Billy Fischer, MD;  PCCM service; Mobile [phone]

## 2014-12-10 NOTE — Op Note (Signed)
Preoperative diagnosis: Perforated appendicitis status post failed conservative management Postoperative diagnosis: Perforated appendicitis with diffuse peritonitis Procedure: #1 appendectomy #2 small bowel resection #3 application of abdominal wound VAC Surgeon: Dr. Harden Mo Asst.: Dr. Violeta Gelinas Anesthesia: Gen. Estimated blood loss: 100 mL Specimens: Appendix and small bowel to pathology Drains: None Complications: Small bowel removed due to rent in mesentery and friability from infection Disposition to ICU in stable condition  Indications: This is a 32 yof who was admitted 2 nights ago with perforated appendicitis. She appeared to improve initially with abx and resuscitation with creatinine going down, no more fevers and wbc improving. She had some bowel function. Then today she had more pain, emesis, no more bowel function and her abdomen was more tender. Her wbc was rising. I discussed going to or emergently.  Procedure: After informed consent was obtained the patient was taken to the operating room. She was already on antibiotics. She had sequential compression devices on. She was then placed under general anesthesia without complication. She was prepped and draped in the standard sterile surgical fashion. Surgical timeout was then performed.  I made a midline incision. This was very difficult due to her habitus. Once I had done this there was purulence that came out from all over her abdomen. Immediately her bowel came out and was very edematous and purple.  I was able to with great difficulty pull this bowel up and when I did this I made a rent in the mesentery as this was friable. The bowel had patchy necrosis as well.  I was eventually able to run her bowel but had made a rent in the mesentery leading to a portion being nonvascularized. I then resected this portion with gia staplers. I left this in discontinuity as the bowel will need a second look prior to reconnecting. The  distal portion was where the bowel obstruction was and this bowel is viable but will also need to be looked at again.  I was able to identify the appendix which was completely open. The base appeared ok. I divided the mesentery and oversewed this with silk suture. I then was able to divide the appendix with a TX stapler. I evacuated all purulence and stool.  Irrigation was performed. I oversewed the mesenteric vessels that were bleeding with silk suture.  I did not want to reconnect her now due to concern for remainder of bowel appearance.  This did appear viable except for some patchy areas once in the abdomen. Her abdomen would not close due to swelling and high peak airway pressures.  I placed an abdominal wound vac.  She will be transferred to icu critical.

## 2014-12-10 NOTE — Plan of Care (Signed)
Problem: Phase I Progression Outcomes Goal: Tubes/drains patent Outcome: Progressing NGT to LIWS, Foley to gravity drain, abd. Wound vac to 125cm suction. All tubes and drains patent.

## 2014-12-10 NOTE — Progress Notes (Signed)
eLink Physician-Brief Progress Note Patient Name: Allison [name] DOB: 1960/10/14 MRN: [id number]   Date of Service  12/10/2014  HPI/Events of Note  ABG on 50%/PRVC 14/TV 450/P 5 = 7.17/35.03/23/12  eICU Interventions  Will order: 1. Increase Rate to 20 and PEEP to 8 cm H2O. 2. NaHCO3 100 meq IV now.  3. ABG at 6 PM.     Intervention Category Major Interventions: Respiratory failure - evaluation and management;Acid-Base disturbance - evaluation and management  Sommer,Steven Eugene 12/10/2014, 4:41 PM

## 2014-12-11 ENCOUNTER — Inpatient Hospital Stay (HOSPITAL_COMMUNITY): Payer: BC Managed Care – PPO

## 2014-12-11 DIAGNOSIS — N179 Acute kidney failure, unspecified: Secondary | ICD-10-CM

## 2014-12-11 DIAGNOSIS — A419 Sepsis, unspecified organism: Principal | ICD-10-CM

## 2014-12-11 DIAGNOSIS — R6520 Severe sepsis without septic shock: Secondary | ICD-10-CM

## 2014-12-11 LAB — POCT I-STAT 3, ART BLOOD GAS (G3+)
ACID-BASE DEFICIT: 8 mmol/L — AB (ref 0.0–2.0)
Bicarbonate: 17.5 mEq/L — ABNORMAL LOW (ref 20.0–24.0)
O2 SAT: 95 %
TCO2: 19 mmol/L (ref 0–100)
pCO2 arterial: 35.9 mmHg (ref 35.0–45.0)
pH, Arterial: 7.295 — ABNORMAL LOW (ref 7.350–7.450)
pO2, Arterial: 83 mmHg (ref 80.0–100.0)

## 2014-12-11 LAB — GLUCOSE, CAPILLARY
GLUCOSE-CAPILLARY: 134 mg/dL — AB (ref 65–99)
GLUCOSE-CAPILLARY: 136 mg/dL — AB (ref 65–99)
GLUCOSE-CAPILLARY: 184 mg/dL — AB (ref 65–99)
GLUCOSE-CAPILLARY: 234 mg/dL — AB (ref 65–99)
Glucose-Capillary: 218 mg/dL — ABNORMAL HIGH (ref 65–99)
Glucose-Capillary: 176 mg/dL — ABNORMAL HIGH (ref 65–99)

## 2014-12-11 LAB — CBC
HEMATOCRIT: 31.2 % — AB (ref 36.0–46.0)
Hemoglobin: 10.2 g/dL — ABNORMAL LOW (ref 12.0–15.0)
MCH: 29.3 pg (ref 26.0–34.0)
MCHC: 32.7 g/dL (ref 30.0–36.0)
MCV: 89.7 fL (ref 78.0–100.0)
Platelets: 368 10*3/uL (ref 150–400)
RBC: 3.48 MIL/uL — ABNORMAL LOW (ref 3.87–5.11)
RDW: 14.7 % (ref 11.5–15.5)
WBC: 13.9 10*3/uL — AB (ref 4.0–10.5)

## 2014-12-11 LAB — BASIC METABOLIC PANEL
Anion gap: 8 (ref 5–15)
BUN: 28 mg/dL — ABNORMAL HIGH (ref 6–20)
CALCIUM: 7 mg/dL — AB (ref 8.9–10.3)
CO2: 19 mmol/L — ABNORMAL LOW (ref 22–32)
Chloride: 109 mmol/L (ref 101–111)
Creatinine, Ser: 1.14 mg/dL — ABNORMAL HIGH (ref 0.44–1.00)
GFR, EST NON AFRICAN AMERICAN: 54 mL/min — AB (ref >60)
GLUCOSE: 256 mg/dL — AB (ref 65–99)
POTASSIUM: 3.9 mmol/L (ref 3.5–5.1)
SODIUM: 136 mmol/L (ref 135–145)

## 2014-12-11 MED ORDER — SODIUM CHLORIDE 0.9 % IV SOLN
INTRAVENOUS | Status: DC
  Administered 2014-12-11: 30 mL/h via INTRAVENOUS
  Administered 2014-12-13: 04:00:00 via INTRAVENOUS

## 2014-12-11 MED ORDER — VANCOMYCIN HCL IN DEXTROSE 1-5 GM/200ML-% IV SOLN
1000.0000 mg | Freq: Two times a day (BID) | INTRAVENOUS | Status: DC
  Administered 2014-12-11 – 2014-12-13 (×4): 1000 mg via INTRAVENOUS
  Filled 2014-12-11 (×6): qty 200

## 2014-12-11 NOTE — Progress Notes (Signed)
CRITICAL VALUE ALERT  Critical value received:  Gram (+) cocci in pairs and chains  Date of notification:  t  Time of notification:  0118  Critical value read back:Yes.    Nurse who received alert:  Almedia Balls, RN   MD notified (1st page): PCCM MD  Time of first page:  0120  MD notified (2nd page):  Time of second page:  Responding MD:  Dr L. Deterding  Time MD responded: 0120

## 2014-12-11 NOTE — Progress Notes (Signed)
PULMONARY / CRITICAL CARE MEDICINE   Name: Allison [name] MRN: [id number] DOB: 1961/04/29    ADMISSION DATE:  12/07/2014 CONSULTATION DATE:  6/17  REFERRING MD :  Dwain Sarna   CHIEF COMPLAINT:   Severe sepsis Assist w/ vent management   INITIAL PRESENTATION:   15 yof who was admitted 6/14  W/ 2 night h/o perforated appendicitis. She appeared to improve initially with abx and resuscitation Then on 6/17 she had more pain, emesis, no more bowel function and her abdomen was more tender. Her wbc was rising. She went for emergent exploration, found to have severe peritonitis as well as evidence of bowel ischemia. She underwent appendectomy, SB resection and was left in discontinuity. Returned to the ICU on vent. PCCM asked to assist w/ care.   STUDIES:  CT abd/pelvis 6/15: Inflammatory process demonstrated throughout the pelvis with fluid and infiltration in the low pelvis and mesenteric. At appendicolith with abnormal appearing appendix, likely this represents perforated appendicitis. No discrete abscess is identified. There is small bowel wall thickening of the pelvic loops with proximal small bowel obstruction.  SIGNIFICANT EVENTS: Exp lap 6/17  HISTORY OF PRESENT ILLNESS:   68 yof who was admitted 6/14  W/ 2 night h/o perforated appendicitis. She appeared to improve initially with abx and resuscitation with creatinine going down, no more fevers and wbc improving. She had some bowel function. Then on 6/17 she had more pain, emesis, no more bowel function and her abdomen was more tender. Her wbc was rising. She went for emergent exploration, found to have severe peritonitis as well as evidence of bowel ischemia. She underwent appendectomy, SB resection and was left in discontinuity. Returned to the ICU on vent. PCCM asked to assist w/ care.   INTERVAL EVENTS / SUBJECTIVE:  scgh  VITAL SIGNS: Temp:  [97.7 F (36.5 C)-100.7 F (38.2 C)] 99.1 F (37.3 C) (06/18 0400) Pulse Rate:   [79-118] 84 (06/18 0600) Resp:  [10-36] 22 (06/18 0600) BP: (94-155)/(42-84) 97/42 mmHg (06/18 0600) SpO2:  [93 %-100 %] 100 % (06/18 0600) Arterial Line BP: (72-200)/(43-60) 109/56 mmHg (06/18 0600) FiO2 (%):  [40 %-50 %] 40 % (06/18 0339) Weight:  [135.9 kg (299 lb 9.7 oz)] 135.9 kg (299 lb 9.7 oz) (06/18 0500) HEMODYNAMICS: CVP:  [9 mmHg-19 mmHg] 19 mmHg VENTILATOR SETTINGS: Vent Mode:  [-] PRVC FiO2 (%):  [40 %-50 %] 40 % Set Rate:  [14 bmp-20 bmp] 20 bmp Vt Set:  [450 mL-500 mL] 500 mL PEEP:  [5 cmH20-8 cmH20] 8 cmH20 Plateau Pressure:  [25 cmH20] 25 cmH20 INTAKE / OUTPUT:  Intake/Output Summary (Last 24 hours) at 12/11/14 0733 Last data filed at 12/11/14 0700  Gross per 24 hour  Intake 4703.25 ml  Output   5200 ml  Net -496.75 ml    PHYSICAL EXAMINATION: General:  Sedated on vent  Neuro:  Sedated on vent  HEENT:  Orally intubated. Neck large  Cardiovascular:  rrr Lungs:  Decreased bases  Abdomen:  Obese, vac dressing intact  Musculoskeletal:  Intact  Skin:  Intact   LABS:  CBC  Recent Labs Lab 12/10/14 0340 12/10/14 1607 12/11/14 0408  WBC 16.1* 14.4* 13.9*  HGB 11.4* 11.6* 10.2*  HCT 35.3* 35.6* 31.2*  PLT 332 343 368   Coag's  Recent Labs Lab 12/10/14 2028  APTT 44*  INR 1.43   BMET  Recent Labs Lab 12/10/14 0340 12/10/14 1607 12/11/14 0408  NA 134* 132* 136  K 4.2 4.3 3.9  CL 108 110  109  CO2 18* 15* 19*  BUN 33* 34* 28*  CREATININE 1.63* 1.54* 1.14*  GLUCOSE 217* 255* 256*   Electrolytes  Recent Labs Lab 12/10/14 0340 12/10/14 1607 12/11/14 0408  CALCIUM 6.9* 6.8* 7.0*  MG  --  1.7  --    Sepsis Markers  Recent Labs Lab 12/08/14 0328 12/10/14 1700  LATICACIDVEN 1.15 1.0   ABG  Recent Labs Lab 12/10/14 1812 12/10/14 2033 12/11/14 0401  PHART 7.273* 7.310* 7.295*  PCO2ART 35.6 34.4* 35.9  PO2ART 85.0 89.0 83.0   Liver Enzymes  Recent Labs Lab 12/07/14 1930 12/08/14 0820  AST 22 22  ALT 18 16  ALKPHOS  65 54  BILITOT 0.8 0.5  ALBUMIN 2.9* 2.1*   Cardiac Enzymes  Recent Labs Lab 12/08/14 0507 12/08/14 1057 12/08/14 1800  TROPONINI 0.06* <0.03 <0.03   Glucose  Recent Labs Lab 12/10/14 0733 12/10/14 1238 12/10/14 1606 12/10/14 2023 12/10/14 2343 12/11/14 0358  GLUCAP 177* 196* 212* 232* 233* 234*    Imaging Portable Chest Xray  12/10/2014   CLINICAL DATA:  Intubation.  EXAM: PORTABLE CHEST - 1 VIEW  COMPARISON:  01/01/2010  FINDINGS: Endotracheal tube has its tip 3 cm above the carina. Nasogastric tube enters the abdomen. Left internal jugular central line has its tip in the midportion of the right atrium. Right arm PICC has its tip in the proximal right atrium. There is volume loss or infiltrate in both lower lobes, left worse than right. There may be a small amount of pleural fluid on the left.  IMPRESSION: Endotracheal tube and nasogastric tube grossly well positioned. Left internal jugular central line probably in the central right atrium. Right arm PICC either at the SVC RA junction or in the proximal right atrium.  Volume loss or infiltrate in the lower lobes left worse than right.   Electronically Signed   By: Paulina Fusi M.D.   On: 12/10/2014 16:14   CXR 6/18 reviewed by me > hardware in good position, no infiltrates or effusions.   ASSESSMENT / PLAN:  PULMONARY OETT A: Acute respiratory failure in setting of shock Probable OSA  P:   Full vent support  PAD protocol  F/u abg and cxr Will keep ventilated over weekend as scheduled to go back to OR   CARDIOVASCULAR CVL 6/17 PICC >>> A:  Severe sepsis in setting of peritonitis, lactate reassuring 6/17pm HTN [place] in OR, resolved P:  Follow CBC See ID section  MAP goal >65 CVP goal 8-12   RENAL A:   AKI, improving  NAG metabolic acidosis  P:   Decrease MIVF w/ NS Avoid hypotension  Consider addition of bicarb given no-AG Strict I&O Foley cath   GASTROINTESTINAL A:   Ruptured  appendix Peritonitis  Ischemic bowel Now s/p expl lap, appendectomy, SB resection, and placement of wound vac 6/17; bowel left in discontinuity  P:  Nutrition per surg. Anticipate post-op ileus as bowel left in discontinuity  Wound care per surg  For return to OR in next 24-48 hrs  HEMATOLOGIC A:  Anemia  P:  Trend CBC [place] heparin  xfuse for hgb < 7   INFECTIOUS Peritoneal fluid 6/17 >> GPC >  Blood cx 6/18 >>  A:   Sepsis Peritonitis in setting of ruptured appendix  Possible bacterial translocation d/t ischemic bowel  P:    invanz 6/15>>>  Send blood cx today 6/18  ENDOCRINE A:   Type II DM Hyperglycemia  P:   q4h ssi  Change  IVF to NS   NEUROLOGIC A:   Acute encephalopathy  Pain  P:   RASS goal: -2 PAD protocol will use prop and fent    FAMILY  - Updates: pending   - Inter-disciplinary family meet or Palliative Care meeting due by: 6/23    TODAY'S SUMMARY:  Peritonitis from ruptured appendix and ischemic bowel. Returns to ICU on vent. She was left in discontinuity w/ plan to re-explore in next 48 hours. We will ensure hemodynamic stability, cont supportive care, and mechanical ventilation. Will leave her intubated until after her re-exploration.   Independent CC time 31 minutes  Levy Pupa, MD, PhD 12/11/2014, 7:42 AM [facility] [phone] or if no answer [phone]

## 2014-12-11 NOTE — Progress Notes (Signed)
1 Day Post-Op  Subjective: On vent  Objective: Vital signs in last 24 hours: Temp:  [98.4 F (36.9 C)-100.7 F (38.2 C)] 99.1 F (37.3 C) (06/18 0400) Pulse Rate:  [79-118] 84 (06/18 0600) Resp:  [10-36] 22 (06/18 0600) BP: (94-155)/(42-84) 97/42 mmHg (06/18 0600) SpO2:  [93 %-100 %] 100 % (06/18 0600) Arterial Line BP: (72-200)/(43-60) 109/56 mmHg (06/18 0600) FiO2 (%):  [40 %-50 %] 40 % (06/18 0339) Weight:  [135.9 kg (299 lb 9.7 oz)] 135.9 kg (299 lb 9.7 oz) (06/18 0500) Last BM Date: 12/09/14  Intake/Output from previous day: 06/17 0701 - 06/18 0700 In: 4703.3 [I.V.:3783.3; NG/GT:120; IV Piggyback:800] Out: 5200 [Urine:2100; Emesis/NG output:850; Drains:1150; Blood:100] Intake/Output this shift:    General appearance: no distress Resp: clear to auscultation bilaterally Cardio: regular rate and rhythm GI: open abdomen VAC in place, SS drainage  Lab Results:   Recent Labs  12/10/14 1607 12/11/14 0408  WBC 14.4* 13.9*  HGB 11.6* 10.2*  HCT 35.6* 31.2*  PLT 343 368   BMET  Recent Labs  12/10/14 1607 12/11/14 0408  NA 132* 136  K 4.3 3.9  CL 110 109  CO2 15* 19*  GLUCOSE 255* 256*  BUN 34* 28*  CREATININE 1.54* 1.14*  CALCIUM 6.8* 7.0*   PT/INR  Recent Labs  12/10/14 2028  LABPROT 17.5*  INR 1.43   ABG  Recent Labs  12/10/14 2033 12/11/14 0401  PHART 7.310* 7.295*  HCO3 17.3* 17.5*    Studies/Results: Portable Chest Xray  12/10/2014   CLINICAL DATA:  Intubation.  EXAM: PORTABLE CHEST - 1 VIEW  COMPARISON:  01/01/2010  FINDINGS: Endotracheal tube has its tip 3 cm above the carina. Nasogastric tube enters the abdomen. Left internal jugular central line has its tip in the midportion of the right atrium. Right arm PICC has its tip in the proximal right atrium. There is volume loss or infiltrate in both lower lobes, left worse than right. There may be a small amount of pleural fluid on the left.  IMPRESSION: Endotracheal tube and nasogastric tube  grossly well positioned. Left internal jugular central line probably in the central right atrium. Right arm PICC either at the SVC RA junction or in the proximal right atrium.  Volume loss or infiltrate in the lower lobes left worse than right.   Electronically Signed   By: Paulina Fusi M.D.   On: 12/10/2014 16:14    Anti-infectives: Anti-infectives    Start     Dose/Rate Route Frequency Ordered Stop   12/10/14 1700  vancomycin (VANCOCIN) 1,500 mg in sodium chloride 0.9 % 500 mL IVPB     1,500 mg 250 mL/hr over 120 Minutes Intravenous Every 24 hours 12/10/14 1601     12/09/14 1100  ertapenem (INVANZ) 1 g in sodium chloride 0.9 % 50 mL IVPB  Status:  Discontinued    Comments:  She has hives from pcn before, the cross reactivity is low and I think there is some benefit to hopefully avoiding surgery in this patient without other good choices for abx, I think cipro/flagyl has too much resistance and she has perforation   1 g 100 mL/hr over 30 Minutes Intravenous Every 24 hours 12/09/14 0940 12/09/14 1017   12/09/14 1100  ertapenem (INVANZ) 1 g in sodium chloride 0.9 % 50 mL IVPB  Status:  Discontinued    Comments:  She has hives from pcn before, the cross reactivity is low and I think there is some benefit to hopefully avoiding surgery  in this patient without other good choices for abx, I think cipro/flagyl has too much resistance and she has perforation   1 g 100 mL/hr over 30 Minutes Intravenous Every 24 hours 12/09/14 1017 12/09/14 1018   12/09/14 1100  ertapenem (INVANZ) 1 g in sodium chloride 0.9 % 50 mL IVPB    Comments:  She has hives from pcn before, the cross reactivity is low and I think there is some benefit to hopefully avoiding surgery in this patient without other good choices for abx, I think cipro/flagyl has too much resistance and she has perforation   1 g 100 mL/hr over 30 Minutes Intravenous Every 24 hours 12/09/14 1020     12/09/14 0945  ertapenem (INVANZ) injection 1 g  Status:   Discontinued    Comments:  She has hives from pcn before, the cross reactivity is low and I think there is some benefit to hopefully avoiding surgery in this patient without other good choices for abx, I think cipro/flagyl has too much resistance and she has perforation   1 g Intramuscular Every 24 hours 12/09/14 0937 12/09/14 0941   12/08/14 0800  ciprofloxacin (CIPRO) IVPB 400 mg  Status:  Discontinued     400 mg 200 mL/hr over 60 Minutes Intravenous Every 12 hours 12/08/14 0652 12/09/14 0933   12/08/14 0800  metroNIDAZOLE (FLAGYL) IVPB 500 mg  Status:  Discontinued     500 mg 100 mL/hr over 60 Minutes Intravenous Every 8 hours 12/08/14 0652 12/09/14 0933   12/08/14 0330  piperacillin-tazobactam (ZOSYN) IVPB 3.375 g     3.375 g 100 mL/hr over 30 Minutes Intravenous  Once 12/08/14 0328 12/08/14 0513      Assessment/Plan: POD#1 S/P ex lap, appendectomy, SBR, left open with VAC - back to OR tomorrow for SB anastamosis and possible closure by Dr. Derrell Lolling. Keep sedated until then. ID - Vanc/Invanz  LOS: 3 days    Allison [name] E 12/11/2014

## 2014-12-11 NOTE — Progress Notes (Signed)
Initial Nutrition Assessment  DOCUMENTATION CODES:  Morbid obesity  INTERVENTION:   (RD to follow for vent status and initiation of nutrition support)   If pt unable to extubate and initiate enteral or PO nutrition within 7-10 days of intubation, recommend initiation of TPN.   NUTRITION DIAGNOSIS:  Inadequate oral intake related to inability to eat as evidenced by NPO status.  GOAL:  Provide needs based on ASPEN/SCCM guidelines  MONITOR:  Vent status, Labs, Weight trends, Skin, I & O's, Other (Comment) (initiation of nutrition support)  REASON FOR ASSESSMENT:  Ventilator    ASSESSMENT: 63 yof who was admitted 6/14 W/ 2 night h/o perforated appendicitis. She appeared to improve initially with abx and resuscitation Then on 6/17 she had more pain, emesis, no more bowel function and her abdomen was more tender. Her wbc was rising. She went for emergent exploration, found to have severe peritonitis as well as evidence of bowel ischemia. She underwent appendectomy, SB resection and was left in discontinuity. Returned to the ICU on vent. PCCM asked to assist w/ care.   Pt s/p Procedure on 12/10/14: #1 appendectomy #2 small bowel resection #3 application of abdominal wound VAC  Patient is currently intubated on ventilator support. OGT in place. MV: 11.5 L/min Temp (24hrs), Avg:99.4 F (37.4 C), Min:98.4 F (36.9 C), Max:100.7 F (38.2 C)  Propofol: 22.9 ml/hr (605 kcals)  Reviewed surgical note, plan is back to OR tomorrow for SB anastamosis and possible closure. Pt with abdominal wound vac.  Per MD notes, plan is to kleep NPO and MD anticipates post-op ileus.  Nutrition-Focused physical exam completed. Findings are no fat depletion, no muscle depletion, and mild edema.    Height:  Ht Readings from Last 1 Encounters:  12/08/14 5\' 3"  (1.6 m)    Weight:  Wt Readings from Last 1 Encounters:  12/11/14 299 lb 9.7 oz (135.9 kg)    Ideal Body Weight:  52.3 kg  Wt  Readings from Last 10 Encounters:  12/11/14 299 lb 9.7 oz (135.9 kg)  09/09/14 282 lb (127.914 kg)  06/10/14 283 lb 9.6 oz (128.64 kg)  03/23/14 279 lb (126.554 kg)  12/15/13 276 lb 3.2 oz (125.283 kg)  09/14/13 280 lb 6.4 oz (127.189 kg)  06/16/13 279 lb 3.2 oz (126.644 kg)  12/15/12 277 lb 9.6 oz (125.919 kg)  05/16/10 211 lb (95.709 kg)  05/09/10 205 lb (92.987 kg)    BMI:  Body mass index is 53.09 kg/(m^2).  Estimated Nutritional Needs:  Kcal:  1431.9  Protein:  >130 grams  Fluid:  > 1.5 L  Skin:  Wound (see comment) (closed mid abdominal incision (wound VAC))  Diet Order:  Diet NPO time specified  EDUCATION NEEDS:  No education needs identified at this time   Intake/Output Summary (Last 24 hours) at 12/11/14 1146 Last data filed at 12/11/14 0900  Gross per 24 hour  Intake 4583.28 ml  Output   4905 ml  Net -321.72 ml    Last BM:  12/09/14  Ginnie Marich A. Mayford Knife, RD, LDN, CDE Pager: [phone] After hours Pager: [phone]

## 2014-12-11 NOTE — Progress Notes (Signed)
ANTIBIOTIC CONSULT NOTE - FOLLOW UP  Pharmacy Consult for Vancomycin Indication: Empiric coverage of GPC in pairs/chains in peritoneal fluid cultures  Allergies  Allergen Reactions  . Sulfonamide Derivatives Anaphylaxis  . Aspirin Nausea And Vomiting  . Penicillins Hives  . Latex Rash    Patient Measurements: Height:  (160 cm) Weight: 299 lb 9.7 oz (135.9 kg) IBW/kg (Calculated) : 52.4  Vital Signs: Temp: 100.5 F (38.1 C) (06/18 1100) Temp Source: Oral (06/18 1100) BP: 98/45 mmHg (06/18 1100) Pulse Rate: 85 (06/18 1100) Intake/Output from previous day: 06/17 0701 - 06/18 0700 In: 4703.3 [I.V.:3783.3; NG/GT:120; IV Piggyback:800] Out: 5375 [Urine:2175; Emesis/NG output:850; Drains:1250; Blood:100] Intake/Output from this shift: Total I/O In: 587.9 [I.V.:477.9; NG/GT:60; IV Piggyback:50] Out: 625 [Urine:350; Drains:275]  Labs:  Recent Labs  12/10/14 0340 12/10/14 1607 12/11/14 0408  WBC 16.1* 14.4* 13.9*  HGB 11.4* 11.6* 10.2*  PLT 332 343 368  CREATININE 1.63* 1.54* 1.14*   Estimated Creatinine Clearance: 77.3 mL/min (by C-G formula based on Cr of 1.14). No results for input(s): VANCOTROUGH, VANCOPEAK, VANCORANDOM, GENTTROUGH, GENTPEAK, GENTRANDOM, TOBRATROUGH, TOBRAPEAK, TOBRARND, AMIKACINPEAK, AMIKACINTROU, AMIKACIN in the last 72 hours.   Microbiology: Recent Results (from the past 720 hour(s))  Anaerobic culture     Status: None (Preliminary result)   Collection Time: 12/10/14  1:52 PM  Result Value Ref Range Status   Specimen Description FLUID PERITONEAL  Final   Special Requests ON A SWAB  Final   Gram Stain   Final    MODERATE WBC PRESENT,BOTH PMN AND MONONUCLEAR FEW GRAM POSITIVE COCCI IN PAIRS RARE GRAM POSITIVE COCCI IN CHAINS Results Called to: Chrisandra Carota [id number] WILDERK CONFIRMED BY K WILDER    Culture NO GROWTH < 12 HOURS  Final   Report Status PENDING  Incomplete  Body fluid culture     Status: None (Preliminary result)   Collection Time: 12/10/14  1:52 PM  Result Value Ref Range Status   Specimen Description FLUID PERITONEAL  Final   Special Requests ON A SWAB  Final   Gram Stain   Final    MODERATE WBC PRESENT,BOTH PMN AND MONONUCLEAR RARE GRAM POSITIVE COCCI IN PAIRS Results Called toChrisandra Carota [phone] WILDERK    Culture NO GROWTH < 24 HOURS  Final   Report Status PENDING  Incomplete  MRSA PCR Screening     Status: None   Collection Time: 12/10/14  9:43 PM  Result Value Ref Range Status   MRSA by PCR NEGATIVE NEGATIVE Final    Comment:        The GeneXpert MRSA Assay (FDA approved for NASAL specimens only), is one component of a comprehensive MRSA colonization surveillance program. It is not intended to diagnose MRSA infection nor to guide or monitor treatment for MRSA infections.   Culture, blood (routine x 2)     Status: None (Preliminary result)   Collection Time: 12/11/14 10:49 AM  Result Value Ref Range Status   Specimen Description BLOOD LEFT ARM  Final   Special Requests BOTTLES DRAWN AEROBIC ONLY 10CC  Final   Culture PENDING  Incomplete   Report Status PENDING  Incomplete  Culture, blood (routine x 2)     Status: None (Preliminary result)   Collection Time: 12/11/14 10:55 AM  Result Value Ref Range Status   Specimen Description BLOOD LEFT ARM  Final   Special Requests BOTTLES DRAWN AEROBIC ONLY 2CC  Final   Culture PENDING  Incomplete   Report Status PENDING  Incomplete  Anti-infectives    Start     Dose/Rate Route Frequency Ordered Stop   12/10/14 1700  vancomycin (VANCOCIN) 1,500 mg in sodium chloride 0.9 % 500 mL IVPB     1,500 mg 250 mL/hr over 120 Minutes Intravenous Every 24 hours 12/10/14 1601     12/09/14 1100  ertapenem (INVANZ) 1 g in sodium chloride 0.9 % 50 mL IVPB  Status:  Discontinued    Comments:  She has hives from pcn before, the cross reactivity is low and I think there is some benefit to hopefully avoiding surgery in this patient without other  good choices for abx, I think cipro/flagyl has too much resistance and she has perforation   1 g 100 mL/hr over 30 Minutes Intravenous Every 24 hours 12/09/14 0940 12/09/14 1017   12/09/14 1100  ertapenem (INVANZ) 1 g in sodium chloride 0.9 % 50 mL IVPB  Status:  Discontinued    Comments:  She has hives from pcn before, the cross reactivity is low and I think there is some benefit to hopefully avoiding surgery in this patient without other good choices for abx, I think cipro/flagyl has too much resistance and she has perforation   1 g 100 mL/hr over 30 Minutes Intravenous Every 24 hours 12/09/14 1017 12/09/14 1018   12/09/14 1100  ertapenem (INVANZ) 1 g in sodium chloride 0.9 % 50 mL IVPB    Comments:  She has hives from pcn before, the cross reactivity is low and I think there is some benefit to hopefully avoiding surgery in this patient without other good choices for abx, I think cipro/flagyl has too much resistance and she has perforation   1 g 100 mL/hr over 30 Minutes Intravenous Every 24 hours 12/09/14 1020     12/09/14 0945  ertapenem (INVANZ) injection 1 g  Status:  Discontinued    Comments:  She has hives from pcn before, the cross reactivity is low and I think there is some benefit to hopefully avoiding surgery in this patient without other good choices for abx, I think cipro/flagyl has too much resistance and she has perforation   1 g Intramuscular Every 24 hours 12/09/14 0937 12/09/14 0941   12/08/14 0800  ciprofloxacin (CIPRO) IVPB 400 mg  Status:  Discontinued     400 mg 200 mL/hr over 60 Minutes Intravenous Every 12 hours 12/08/14 0652 12/09/14 0933   12/08/14 0800  metroNIDAZOLE (FLAGYL) IVPB 500 mg  Status:  Discontinued     500 mg 100 mL/hr over 60 Minutes Intravenous Every 8 hours 12/08/14 0652 12/09/14 0933   12/08/14 0330  piperacillin-tazobactam (ZOSYN) IVPB 3.375 g     3.375 g 100 mL/hr over 30 Minutes Intravenous  Once 12/08/14 0328 12/08/14 0513      Assessment: 54  YOF who presented with abdominal pain and diarrhea on 6/15 and was found to have a perforated appendix. The patient underwent an appendectomy, SBR, and application of wound VAC on 6/17. Vancomycin was added empirically on 6/17 after peritoneal fluid cultures resulted as growing GPC in pairs/chains. Given the patient's improved renal function today, SCr 1.14, CrCl~70-80 ml/min (normalized) - will increase the dose slightly today.   Goal of Therapy:  Vancomycin trough level 15-20 mcg/ml  Plan:  1. Adjust Vancomycin to 1g IV every 12 hours 2. Will continue to follow renal function, culture results, LOT, and antibiotic de-escalation plans   Georgina Pillion, PharmD, BCPS Clinical Pharmacist Pager: [phone] 12/11/2014 12:58 PM

## 2014-12-12 ENCOUNTER — Inpatient Hospital Stay (HOSPITAL_COMMUNITY): Payer: BC Managed Care – PPO

## 2014-12-12 ENCOUNTER — Inpatient Hospital Stay (HOSPITAL_COMMUNITY): Payer: BC Managed Care – PPO | Admitting: Anesthesiology

## 2014-12-12 ENCOUNTER — Encounter (HOSPITAL_COMMUNITY): Admission: EM | Disposition: A | Discharge status: Skilled Nursing Facility | Payer: Self-pay | Source: Home / Self Care

## 2014-12-12 HISTORY — PX: LAPAROTOMY: SHX154

## 2014-12-12 LAB — CBC
HEMATOCRIT: 28.1 % — AB (ref 36.0–46.0)
Hemoglobin: 9.1 g/dL — ABNORMAL LOW (ref 12.0–15.0)
MCH: 29.4 pg (ref 26.0–34.0)
MCHC: 32.4 g/dL (ref 30.0–36.0)
MCV: 90.6 fL (ref 78.0–100.0)
Platelets: 413 10*3/uL — ABNORMAL HIGH (ref 150–400)
RBC: 3.1 MIL/uL — ABNORMAL LOW (ref 3.87–5.11)
RDW: 15.1 % (ref 11.5–15.5)
WBC: 19.4 10*3/uL — AB (ref 4.0–10.5)

## 2014-12-12 LAB — BASIC METABOLIC PANEL
Anion gap: 6 (ref 5–15)
BUN: 27 mg/dL — ABNORMAL HIGH (ref 6–20)
CO2: 21 mmol/L — ABNORMAL LOW (ref 22–32)
CREATININE: 0.94 mg/dL (ref 0.44–1.00)
Calcium: 7.5 mg/dL — ABNORMAL LOW (ref 8.9–10.3)
Chloride: 113 mmol/L — ABNORMAL HIGH (ref 101–111)
GFR calc Af Amer: >60 mL/min (ref >60)
GFR calc non Af Amer: >60 mL/min (ref >60)
Glucose, Bld: 167 mg/dL — ABNORMAL HIGH (ref 65–99)
Potassium: 3.8 mmol/L (ref 3.5–5.1)
SODIUM: 140 mmol/L (ref 135–145)

## 2014-12-12 LAB — GLUCOSE, CAPILLARY
GLUCOSE-CAPILLARY: 142 mg/dL — AB (ref 65–99)
GLUCOSE-CAPILLARY: 141 mg/dL — AB (ref 65–99)
Glucose-Capillary: 144 mg/dL — ABNORMAL HIGH (ref 65–99)
Glucose-Capillary: 172 mg/dL — ABNORMAL HIGH (ref 65–99)
Glucose-Capillary: 159 mg/dL — ABNORMAL HIGH (ref 65–99)

## 2014-12-12 LAB — MAGNESIUM: MAGNESIUM: 2.3 mg/dL (ref 1.7–2.4)

## 2014-12-12 LAB — PHOSPHORUS: PHOSPHORUS: 2.4 mg/dL — AB (ref 2.5–4.6)

## 2014-12-12 LAB — LACTIC ACID, PLASMA: Lactic Acid, Venous: 0.7 mmol/L (ref 0.5–2.0)

## 2014-12-12 SURGERY — LAPAROTOMY, EXPLORATORY
Anesthesia: General | Site: Abdomen

## 2014-12-12 MED ORDER — SODIUM CHLORIDE 0.9 % IV SOLN
2500.0000 ug | INTRAVENOUS | Status: DC | PRN
  Administered 2014-12-12: 100 ug/h via INTRAVENOUS

## 2014-12-12 MED ORDER — SODIUM CHLORIDE 0.9 % IV SOLN
INTRAVENOUS | Status: DC | PRN
Start: 2014-12-12 — End: 2014-12-12
  Administered 2014-12-12: 11:00:00 via INTRAVENOUS

## 2014-12-12 MED ORDER — FENTANYL CITRATE (PF) 250 MCG/5ML IJ SOLN
INTRAMUSCULAR | Status: AC
  Filled 2014-12-12: qty 5

## 2014-12-12 MED ORDER — VECURONIUM BROMIDE 10 MG IV SOLR
INTRAVENOUS | Status: AC
  Filled 2014-12-12: qty 10

## 2014-12-12 MED ORDER — PHENYLEPHRINE HCL 10 MG/ML IJ SOLN
INTRAMUSCULAR | Status: AC
  Filled 2014-12-12: qty 1

## 2014-12-12 MED ORDER — PHENYLEPHRINE HCL 10 MG/ML IJ SOLN
10.0000 mg | INTRAVENOUS | Status: DC | PRN
  Administered 2014-12-12: 10 ug/min via INTRAVENOUS

## 2014-12-12 MED ORDER — MIDAZOLAM HCL 5 MG/5ML IJ SOLN
INTRAMUSCULAR | Status: DC | PRN
  Administered 2014-12-12: 2 mg via INTRAVENOUS

## 2014-12-12 MED ORDER — LACTATED RINGERS IV SOLN
INTRAVENOUS | Status: DC | PRN
Start: 2014-12-12 — End: 2014-12-12

## 2014-12-12 MED ORDER — FENTANYL CITRATE (PF) 100 MCG/2ML IJ SOLN
INTRAMUSCULAR | Status: DC | PRN
  Administered 2014-12-12 (×2): 50 ug via INTRAVENOUS

## 2014-12-12 MED ORDER — 0.9 % SODIUM CHLORIDE (POUR BTL) OPTIME
TOPICAL | Status: DC | PRN
  Administered 2014-12-12 (×3): 1000 mL

## 2014-12-12 MED ORDER — PHENYLEPHRINE HCL 10 MG/ML IJ SOLN
INTRAMUSCULAR | Status: DC | PRN
  Administered 2014-12-12 (×5): 80 ug via INTRAVENOUS

## 2014-12-12 MED ORDER — PHENYLEPHRINE 40 MCG/ML (10ML) SYRINGE FOR IV PUSH (FOR BLOOD PRESSURE SUPPORT)
PREFILLED_SYRINGE | INTRAVENOUS | Status: AC
  Filled 2014-12-12: qty 10

## 2014-12-12 MED ORDER — PROPOFOL INFUSION 10 MG/ML OPTIME
INTRAVENOUS | Status: DC | PRN
  Administered 2014-12-12: 35 ug/kg/min via INTRAVENOUS

## 2014-12-12 MED ORDER — PROPOFOL 10 MG/ML IV BOLUS
INTRAVENOUS | Status: AC
  Filled 2014-12-12: qty 20

## 2014-12-12 MED ORDER — STERILE WATER FOR INJECTION IJ SOLN
INTRAMUSCULAR | Status: AC
  Filled 2014-12-12: qty 10

## 2014-12-12 MED ORDER — ALBUMIN HUMAN 5 % IV SOLN
INTRAVENOUS | Status: DC | PRN
Start: 2014-12-12 — End: 2014-12-12
  Administered 2014-12-12: 12:00:00 via INTRAVENOUS

## 2014-12-12 MED ORDER — MIDAZOLAM HCL 2 MG/2ML IJ SOLN
INTRAMUSCULAR | Status: AC
  Filled 2014-12-12: qty 2

## 2014-12-12 MED ORDER — VECURONIUM BROMIDE 10 MG IV SOLR
INTRAVENOUS | Status: DC | PRN
  Administered 2014-12-12: 10 mg via INTRAVENOUS
  Administered 2014-12-12: 5 mg via INTRAVENOUS

## 2014-12-12 MED ORDER — ACETAMINOPHEN 160 MG/5ML PO SOLN
650.0000 mg | Freq: Four times a day (QID) | ORAL | Status: DC | PRN
  Administered 2014-12-12 – 2014-12-23 (×2): 650 mg via ORAL
  Filled 2014-12-12 (×2): qty 20.3

## 2014-12-12 MED ORDER — EPHEDRINE SULFATE 50 MG/ML IJ SOLN
INTRAMUSCULAR | Status: DC | PRN
  Administered 2014-12-12: 10 mg via INTRAVENOUS

## 2014-12-12 MED ORDER — PROPOFOL 10 MG/ML IV BOLUS
INTRAVENOUS | Status: DC | PRN
  Administered 2014-12-12: 100 mg via INTRAVENOUS

## 2014-12-12 SURGICAL SUPPLY — 51 items
BLADE SURG ROTATE 9660 (MISCELLANEOUS) IMPLANT
CANISTER SUCTION 2500CC (MISCELLANEOUS) ×3 IMPLANT
CHLORAPREP W/TINT 26ML (MISCELLANEOUS) ×1 IMPLANT
COVER MAYO STAND STRL (DRAPES) IMPLANT
COVER SURGICAL LIGHT HANDLE (MISCELLANEOUS) ×3 IMPLANT
DRAPE LAPAROSCOPIC ABDOMINAL (DRAPES) ×3 IMPLANT
DRAPE PROXIMA HALF (DRAPES) IMPLANT
DRAPE UTILITY XL STRL (DRAPES) ×6 IMPLANT
DRAPE WARM FLUID 44X44 (DRAPE) ×3 IMPLANT
DRSG OPSITE POSTOP 4X10 (GAUZE/BANDAGES/DRESSINGS) IMPLANT
DRSG OPSITE POSTOP 4X8 (GAUZE/BANDAGES/DRESSINGS) IMPLANT
DRSG VAC ATS LRG SENSATRAC (GAUZE/BANDAGES/DRESSINGS) ×2 IMPLANT
ELECT BLADE 6.5 EXT (BLADE) IMPLANT
ELECT CAUTERY BLADE 6.4 (BLADE) ×6 IMPLANT
ELECT REM PT RETURN 9FT ADLT (ELECTROSURGICAL) ×3
ELECTRODE REM PT RTRN 9FT ADLT (ELECTROSURGICAL) ×1 IMPLANT
GLOVE BIO SURGEON STRL SZ7.5 (GLOVE) ×3 IMPLANT
GLOVE BIOGEL PI IND STRL 8 (GLOVE) ×1 IMPLANT
GLOVE BIOGEL PI INDICATOR 8 (GLOVE) ×2
GOWN STRL REUS W/ TWL LRG LVL3 (GOWN DISPOSABLE) ×2 IMPLANT
GOWN STRL REUS W/ TWL XL LVL3 (GOWN DISPOSABLE) ×1 IMPLANT
GOWN STRL REUS W/TWL LRG LVL3 (GOWN DISPOSABLE) ×6
GOWN STRL REUS W/TWL XL LVL3 (GOWN DISPOSABLE) ×3
KIT BASIN OR (CUSTOM PROCEDURE TRAY) ×3 IMPLANT
KIT ROOM TURNOVER OR (KITS) ×3 IMPLANT
LIGASURE IMPACT 36 18CM CVD LR (INSTRUMENTS) IMPLANT
NS IRRIG 1000ML POUR BTL (IV SOLUTION) ×6 IMPLANT
PACK GENERAL/GYN (CUSTOM PROCEDURE TRAY) ×3 IMPLANT
PAD ARMBOARD 7.5X6 YLW CONV (MISCELLANEOUS) ×6 IMPLANT
PENCIL BUTTON HOLSTER BLD 10FT (ELECTRODE) IMPLANT
RELOAD PROXIMATE 75MM BLUE (ENDOMECHANICALS) ×6 IMPLANT
RELOAD STAPLE 75 3.8 BLU REG (ENDOMECHANICALS) IMPLANT
SPECIMEN JAR LARGE (MISCELLANEOUS) IMPLANT
SPONGE ABDOMINAL VAC ABTHERA (MISCELLANEOUS) ×2 IMPLANT
SPONGE LAP 18X18 X RAY DECT (DISPOSABLE) ×4 IMPLANT
STAPLER PROXIMATE 75MM BLUE (STAPLE) ×4 IMPLANT
STAPLER VISISTAT 35W (STAPLE) ×3 IMPLANT
SUCTION POOLE TIP (SUCTIONS) ×5 IMPLANT
SUT NOVA NAB GS-21 0 18 T12 DT (SUTURE) ×4 IMPLANT
SUT PDS AB 1 TP1 96 (SUTURE) ×2 IMPLANT
SUT SILK 2 0 SH CR/8 (SUTURE) ×5 IMPLANT
SUT SILK 2 0 TIES 10X30 (SUTURE) ×3 IMPLANT
SUT SILK 3 0 SH CR/8 (SUTURE) ×1 IMPLANT
SUT SILK 3 0 TIES 10X30 (SUTURE) ×3 IMPLANT
SUT VIC AB 3-0 SH 27 (SUTURE) ×3
SUT VIC AB 3-0 SH 27X BRD (SUTURE) IMPLANT
TOWEL OR 17X26 10 PK STRL BLUE (TOWEL DISPOSABLE) ×3 IMPLANT
TRAY FOLEY CATH 16FRSI W/METER (SET/KITS/TRAYS/PACK) IMPLANT
TUBE CONNECTING 12'X1/4 (SUCTIONS)
TUBE CONNECTING 12X1/4 (SUCTIONS) IMPLANT
YANKAUER SUCT BULB TIP NO VENT (SUCTIONS) IMPLANT

## 2014-12-12 NOTE — Plan of Care (Signed)
Problem: Phase I Progression Outcomes Goal: Pain controlled with appropriate interventions Outcome: Completed/Met Date Met:  12/12/14 Per PAD protocol for RASS -2 using fentanyl and propofol.

## 2014-12-12 NOTE — Progress Notes (Signed)
PULMONARY / CRITICAL CARE MEDICINE   Name: Allison [name] MRN: [id number] DOB: [birth year]    ADMISSION DATE:  12/07/2014 CONSULTATION DATE:  6/17  REFERRING MD :  Dwain Sarna   CHIEF COMPLAINT:   Severe sepsis Assist w/ vent management   INITIAL PRESENTATION:   61 yof who was admitted 6/14  W/ 2 night h/o perforated appendicitis. She appeared to improve initially with abx and resuscitation Then on 6/17 she had more pain, emesis, no more bowel function and her abdomen was more tender. Her wbc was rising. She went for emergent exploration, found to have severe peritonitis as well as evidence of bowel ischemia. She underwent appendectomy, SB resection and was left in discontinuity. Returned to the ICU on vent. PCCM asked to assist w/ care.   STUDIES:  CT abd/pelvis 6/15: Inflammatory process demonstrated throughout the pelvis with fluid and infiltration in the low pelvis and mesenteric. At appendicolith with abnormal appearing appendix, likely this represents perforated appendicitis. No discrete abscess is identified. There is small bowel wall thickening of the pelvic loops with proximal small bowel obstruction.  SIGNIFICANT EVENTS: Exp lap 6/17  HISTORY OF PRESENT ILLNESS:   4 yof who was admitted 6/14  W/ 2 night h/o perforated appendicitis. She appeared to improve initially with abx and resuscitation with creatinine going down, no more fevers and wbc improving. She had some bowel function. Then on 6/17 she had more pain, emesis, no more bowel function and her abdomen was more tender. Her wbc was rising. She went for emergent exploration, found to have severe peritonitis as well as evidence of bowel ischemia. She underwent appendectomy, SB resection and was left in discontinuity. Returned to the ICU on vent. PCCM asked to assist w/ care.   INTERVAL EVENTS / SUBJECTIVE:  No new issues reported overnight Art line is not working this am Planning for OR this am  CVP 9  VITAL  SIGNS: Temp:  [98 F (36.7 C)-100.5 F (38.1 C)] 98 F (36.7 C) (06/19 0718) Pulse Rate:  [74-96] 87 (06/19 0700) Resp:  [11-26] 16 (06/19 0700) BP: (75-120)/(36-65) 116/62 mmHg (06/19 0700) SpO2:  [95 %-100 %] 98 % (06/19 0700) Arterial Line BP: (50-112)/(38-89) 76/67 mmHg (06/19 0700) FiO2 (%):  [40 %] 40 % (06/19 0724) Weight:  [137 kg (302 lb 0.5 oz)] 137 kg (302 lb 0.5 oz) (06/19 0645) HEMODYNAMICS: CVP:  [9 mmHg-32 mmHg] 16 mmHg VENTILATOR SETTINGS: Vent Mode:  [-] PRVC FiO2 (%):  [40 %] 40 % Set Rate:  [20 bmp] 20 bmp Vt Set:  [500 mL] 500 mL PEEP:  [5 cmH20-8 cmH20] 5 cmH20 INTAKE / OUTPUT:  Intake/Output Summary (Last 24 hours) at 12/12/14 0756 Last data filed at 12/12/14 0600  Gross per 24 hour  Intake 2268.94 ml  Output   2720 ml  Net -451.06 ml    PHYSICAL EXAMINATION: General:  Sedated on vent  Neuro:  Sedated on vent  HEENT:  Orally intubated. Neck large, bite block in place Cardiovascular:  Rrr, no M, 80's Lungs:  Decreased bases  Abdomen:  Obese, vac dressing intact  Musculoskeletal:  Intact  Skin:  Intact   LABS:  CBC  Recent Labs Lab 12/10/14 1607 12/11/14 0408 12/12/14 0400  WBC 14.4* 13.9* 19.4*  HGB 11.6* 10.2* 9.1*  HCT 35.6* 31.2* 28.1*  PLT 343 368 413*   Coag's  Recent Labs Lab 12/10/14 2028  APTT 44*  INR 1.43   BMET  Recent Labs Lab 12/10/14 1607 12/11/14 0408 12/12/14 0400  NA 132* 136 140  K 4.3 3.9 3.8  CL 110 109 113*  CO2 15* 19* 21*  BUN 34* 28* 27*  CREATININE 1.54* 1.14* 0.94  GLUCOSE 255* 256* 167*   Electrolytes  Recent Labs Lab 12/10/14 1607 12/11/14 0408 12/12/14 0400  CALCIUM 6.8* 7.0* 7.5*  MG 1.7  --  2.3  PHOS  --   --  2.4*   Sepsis Markers  Recent Labs Lab 12/08/14 0328 12/10/14 1700 12/12/14 0400  LATICACIDVEN 1.15 1.0 0.7   ABG  Recent Labs Lab 12/10/14 1812 12/10/14 2033 12/11/14 0401  PHART 7.273* 7.310* 7.295*  PCO2ART 35.6 34.4* 35.9  PO2ART 85.0 89.0 83.0    Liver Enzymes  Recent Labs Lab 12/07/14 1930 12/08/14 0820  AST 22 22  ALT 18 16  ALKPHOS 65 54  BILITOT 0.8 0.5  ALBUMIN 2.9* 2.1*   Cardiac Enzymes  Recent Labs Lab 12/08/14 0507 12/08/14 1057 12/08/14 1800  TROPONINI 0.06* <0.03 <0.03   Glucose  Recent Labs Lab 12/11/14 0740 12/11/14 1128 12/11/14 1555 12/11/14 1943 12/11/14 2344 12/12/14 0401  GLUCAP 218* 184* 176* 136* 134* 144*    Imaging No results found. CXR 6/18 reviewed by me > hardware in good position, no infiltrates or effusions.   ASSESSMENT / PLAN:  PULMONARY OETT 6/17 >>  A: Acute respiratory failure in setting of shock Probable OSA  P:   Full vent support  PAD protocol  F/u abg and cxr Suspect she will be partially closed in OR 6/19; would not plan to assess for extubation post-op if plan is to return soon to complete closure.   CARDIOVASCULAR CVL 6/17 PICC >>> A:  Severe sepsis in setting of peritonitis, lactate reassuring 6/17pm HTN Bigeminy in OR, resolved P:  Follow CBC See ID section  MAP goal >65 CVP goal 8-12 (currently 9)  RENAL A:   AKI, improving  NAG metabolic acidosis  P:   Decrease MIVF w/ NS Avoid hypotension  Consider addition of bicarb given no-AG Strict I&O Foley cath   GASTROINTESTINAL A:   Ruptured appendix Peritonitis  Ischemic bowel Now s/p expl lap, appendectomy, SB resection, and placement of wound vac 6/17; bowel left in discontinuity  P:  Nutrition per surg. Anticipate post-op ileus as bowel left in discontinuity  Wound care per surg  For return to OR in next 24-48 hrs  HEMATOLOGIC A:  Anemia  P:  Trend CBC Wickes heparin  xfuse for hgb < 7   INFECTIOUS Peritoneal fluid 6/17 >> GPC >  Blood cx 6/18 >>  A:   Sepsis Peritonitis in setting of ruptured appendix  Possible bacterial translocation d/t ischemic bowel  P:    invanz 6/15>>> vanco 6/18 >>   Continue current abx. Consider empiric antifungals if decompensates in any  way given perforation.   ENDOCRINE A:   Type II DM Hyperglycemia  P:   q4h ssi  Changed IVF to NS 6/18  NEUROLOGIC A:   Acute encephalopathy  Pain  P:   RASS goal: -2 PAD protocol will use prop and fent   WUA after OR today   FAMILY  - Updates: pending   - Inter-disciplinary family meet or Palliative Care meeting due by: 6/23    TODAY'S SUMMARY:  Peritonitis from ruptured appendix and ischemic bowel. Returns to ICU on vent. She was left in discontinuity w/ plan to re-explore in next 48 hours. We will ensure hemodynamic stability, cont supportive care, and mechanical ventilation. Will leave her intubated, surgery pending  this am. If they are able to close her abdomen then consider assessment for extubation this pm. More likely will be later this week.   Independent CC time 35 minutes  Levy Pupa, MD, PhD 12/12/2014, 7:56 AM [facility] [phone] or if no answer [phone]

## 2014-12-12 NOTE — Anesthesia Preprocedure Evaluation (Addendum)
Anesthesia Evaluation  Patient identified by MRN, date of birth, ID bandGeneral Assessment Comment:Intubated. CE  Reviewed: Allergy & Precautions, NPO status , Patient's Chart, lab work & pertinent test results  Airway Mallampati: Intubated       Dental   Pulmonary former smoker,  Intubated from ICU.   + decreased breath sounds      Cardiovascular + CAD and + Past MI Rhythm:Regular Rate:Normal     Neuro/Psych    GI/Hepatic negative GI ROS, Neg liver ROS,   Endo/Other  diabetes  Renal/GU Renal disease     Musculoskeletal   Abdominal   Peds  Hematology   Anesthesia Other Findings   Reproductive/Obstetrics                           Anesthesia Physical Anesthesia Plan  ASA: IV  Anesthesia Plan: General   Post-op Pain Management:    Induction: Intravenous  Airway Management Planned: Oral ETT  Additional Equipment:   Intra-op Plan:   Post-operative Plan:   Informed Consent:   Plan Discussed with: CRNA, Anesthesiologist and Surgeon  Anesthesia Plan Comments:        Anesthesia Quick Evaluation

## 2014-12-12 NOTE — Progress Notes (Signed)
2 Days Post-Op  Subjective: Pt sedated with no changes  Objective: Vital signs in last 24 hours: Temp:  [98 F (36.7 C)-100.5 F (38.1 C)] 98 F (36.7 C) (06/19 0718) Pulse Rate:  [74-96] 87 (06/19 0700) Resp:  [11-26] 16 (06/19 0700) BP: (75-120)/(36-65) 116/62 mmHg (06/19 0700) SpO2:  [95 %-100 %] 98 % (06/19 0700) Arterial Line BP: (50-112)/(38-89) 76/67 mmHg (06/19 0700) FiO2 (%):  [40 %] 40 % (06/19 0724) Weight:  [137 kg (302 lb 0.5 oz)] 137 kg (302 lb 0.5 oz) (06/19 0645) Last BM Date: 12/09/14  Intake/Output from previous day: 06/18 0701 - 06/19 0700 In: 2268.9 [I.V.:1638.9; NG/GT:180; IV Piggyback:450] Out: 2720 [Urine:1295; Emesis/NG output:450; Drains:975] Intake/Output this shift:    General appearance: sedated GI: soft, vac in place  Lab Results:   Recent Labs  12/11/14 0408 12/12/14 0400  WBC 13.9* 19.4*  HGB 10.2* 9.1*  HCT 31.2* 28.1*  PLT 368 413*   BMET  Recent Labs  12/11/14 0408 12/12/14 0400  NA 136 140  K 3.9 3.8  CL 109 113*  CO2 19* 21*  GLUCOSE 256* 167*  BUN 28* 27*  CREATININE 1.14* 0.94  CALCIUM 7.0* 7.5*   PT/INR  Recent Labs  12/10/14 2028  LABPROT 17.5*  INR 1.43   ABG  Recent Labs  12/10/14 2033 12/11/14 0401  PHART 7.310* 7.295*  HCO3 17.3* 17.5*    Studies/Results: Portable Chest Xray  12/11/2014   CLINICAL DATA:  Endotracheal tube positioning. Subsequent encounter.  EXAM: PORTABLE CHEST - 1 VIEW  COMPARISON:  Chest radiograph performed 12/10/2014  FINDINGS: The patient's endotracheal tube is seen ending 3-4 cm above the carina. An enteric tube is noted extending below the diaphragm.  The lungs are hypoexpanded. Vascular crowding and mild vascular congestion are seen. Mild right basilar atelectasis is noted. No definite pleural effusion or pneumothorax is identified.  The cardiomediastinal silhouette is borderline normal in size. No acute osseous abnormalities are seen. A right PICC is noted ending about the  distal SVC.  IMPRESSION: 1. Endotracheal tube seen ending 3-4 cm above the carina. 2. Lungs hypoexpanded. Mild vascular congestion seen. Mild right basilar atelectasis noted.   Electronically Signed   By: Roanna Raider M.D.   On: 12/11/2014 08:29   Portable Chest Xray  12/10/2014   CLINICAL DATA:  Intubation.  EXAM: PORTABLE CHEST - 1 VIEW  COMPARISON:  01/01/2010  FINDINGS: Endotracheal tube has its tip 3 cm above the carina. Nasogastric tube enters the abdomen. Left internal jugular central line has its tip in the midportion of the right atrium. Right arm PICC has its tip in the proximal right atrium. There is volume loss or infiltrate in both lower lobes, left worse than right. There may be a small amount of pleural fluid on the left.  IMPRESSION: Endotracheal tube and nasogastric tube grossly well positioned. Left internal jugular central line probably in the central right atrium. Right arm PICC either at the SVC RA junction or in the proximal right atrium.  Volume loss or infiltrate in the lower lobes left worse than right.   Electronically Signed   By: Paulina Fusi M.D.   On: 12/10/2014 16:14    Anti-infectives: Anti-infectives    Start     Dose/Rate Route Frequency Ordered Stop   12/11/14 1400  vancomycin (VANCOCIN) IVPB 1000 mg/200 mL premix     1,000 mg 200 mL/hr over 60 Minutes Intravenous Every 12 hours 12/11/14 1300     12/10/14 1700  vancomycin (VANCOCIN) 1,500 mg in sodium chloride 0.9 % 500 mL IVPB  Status:  Discontinued     1,500 mg 250 mL/hr over 120 Minutes Intravenous Every 24 hours 12/10/14 1601 12/11/14 1300   12/09/14 1100  ertapenem (INVANZ) 1 g in sodium chloride 0.9 % 50 mL IVPB  Status:  Discontinued    Comments:  She has hives from pcn before, the cross reactivity is low and I think there is some benefit to hopefully avoiding surgery in this patient without other good choices for abx, I think cipro/flagyl has too much resistance and she has perforation   1 g 100 mL/hr  over 30 Minutes Intravenous Every 24 hours 12/09/14 0940 12/09/14 1017   12/09/14 1100  ertapenem (INVANZ) 1 g in sodium chloride 0.9 % 50 mL IVPB  Status:  Discontinued    Comments:  She has hives from pcn before, the cross reactivity is low and I think there is some benefit to hopefully avoiding surgery in this patient without other good choices for abx, I think cipro/flagyl has too much resistance and she has perforation   1 g 100 mL/hr over 30 Minutes Intravenous Every 24 hours 12/09/14 1017 12/09/14 1018   12/09/14 1100  ertapenem (INVANZ) 1 g in sodium chloride 0.9 % 50 mL IVPB    Comments:  She has hives from pcn before, the cross reactivity is low and I think there is some benefit to hopefully avoiding surgery in this patient without other good choices for abx, I think cipro/flagyl has too much resistance and she has perforation   1 g 100 mL/hr over 30 Minutes Intravenous Every 24 hours 12/09/14 1020     12/09/14 0945  ertapenem (INVANZ) injection 1 g  Status:  Discontinued    Comments:  She has hives from pcn before, the cross reactivity is low and I think there is some benefit to hopefully avoiding surgery in this patient without other good choices for abx, I think cipro/flagyl has too much resistance and she has perforation   1 g Intramuscular Every 24 hours 12/09/14 0937 12/09/14 0941   12/08/14 0800  ciprofloxacin (CIPRO) IVPB 400 mg  Status:  Discontinued     400 mg 200 mL/hr over 60 Minutes Intravenous Every 12 hours 12/08/14 0652 12/09/14 0933   12/08/14 0800  metroNIDAZOLE (FLAGYL) IVPB 500 mg  Status:  Discontinued     500 mg 100 mL/hr over 60 Minutes Intravenous Every 8 hours 12/08/14 0652 12/09/14 0933   12/08/14 0330  piperacillin-tazobactam (ZOSYN) IVPB 3.375 g     3.375 g 100 mL/hr over 30 Minutes Intravenous  Once 12/08/14 0328 12/08/14 0513      Assessment/Plan: s/p Procedure(s): EXPLORATORY LAPAROTOMY, APPENDECTOMY, SMALL BOWEL RESECTION, WOUND VAC PLACEMENT  (N/A) To OR for washout and possible anastomosis and abd wall closure.   LOS: 4 days    Marigene Ehlers., [facility] 12/12/2014

## 2014-12-12 NOTE — Anesthesia Procedure Notes (Signed)
Date/Time: 12/12/2014 11:39 AM Performed by: Lovie Chol Pre-anesthesia Checklist: Patient identified, Emergency Drugs available, Suction available, Patient being monitored and Timeout performed Patient Re-evaluated:Patient Re-evaluated prior to inductionOxygen Delivery Method: Circle system utilized Intubation Type: Inhalational induction with existing ETT

## 2014-12-12 NOTE — Op Note (Signed)
12/12/2014  1:28 PM  PATIENT:  Allison [name]  54 y.o. female  PRE-OPERATIVE DIAGNOSIS:  open abdomen  POST-OPERATIVE DIAGNOSIS:  open abdomen  PROCEDURE:  Procedure(s): EXPLORATORY LAPAROTOMY, RENASTAMOSISED SMALL BOWEL, open abdominal VAC placed (N/A)  SURGEON:  Surgeon(s) and Role:    * Axel Filler, MD - Primary    * Glenna Fellows, MD - Assisting  ANESTHESIA:   general  EBL: 5CC  Total I/O In: 857.2 [I.V.:577.2; NG/GT:30; IV Piggyback:250] Out: 750 [Urine:400; Emesis/NG output:100; Drains:250]  BLOOD ADMINISTERED:none  DRAINS: none   LOCAL MEDICATIONS USED:  NONE  SPECIMEN:  No Specimen  DISPOSITION OF SPECIMEN:  N/A  COUNTS:  YES  TOURNIQUET:  * No tourniquets in log *  DICTATION: .Dragon Dictation After the patient was consented she was taken back to the operating room and placed in supine position with bilateral SCDs in place. The patient was prepped and draped in usual sterile fashion. A timeout was called all facts verified.  The procedure began by removing the intra-abdominal sponge. There did appear to be some adhesions from small bowel to small bowel. These were gently broken up. The bowel appeared dilated, there was no patchy necrosis. At this time the sutured stapled ends of the small bowel were located. We traced each proximally and distally. There was no subsequent abnormality to the bowel.  At this time an enterotomy was made to each proximal and distal portion of the bowel. A 45 GIA blue load stapler was then used to create a common anastomosis. At this time 3-0 Vicryl was used to close the open enterotomy. This was done in a running standard fashion 2. This closure was imbricated using 3-0 silk's in interrupted fashion. An apex stitch was placed at the anastomosis. The anastomosis is widely patent on palpation Secondary to the mesenteric edema the mesenteric defect was not closed.  At this time we copiously irrigated out the abdomen was sterile  saline. Secondary to the edema and dilation of the small bowel the abdominal wall could not be closed. At this time the decision was made to place the open abdominal wound VAC. This is done in the standard fashion and placed to suction.  The patient tolerated the procedure well was taken to the ICU to bed and in stable condition.   PLAN OF CARE: Admitted  PATIENT DISPOSITION:  iCU - hemodynamically stable.   Delay start of Pharmacological VTE agent (>24hrs) due to surgical blood loss or risk of bleeding: not applicable

## 2014-12-12 NOTE — Transfer of Care (Signed)
Immediate Anesthesia Transfer of Care Note  Patient: Allison [name]  Procedure(s) Performed: Procedure(s): EXPLORATORY LAPAROTOMY, RENASTAMOSISED SMALL BOWEL (N/A)  Patient Location: SICU  Anesthesia Type:General  Level of Consciousness: unresponsive and Patient remains intubated per anesthesia plan  Airway & Oxygen Therapy: Patient remains intubated per anesthesia plan and Patient placed on Ventilator (see vital sign flow sheet for setting)  Post-op Assessment: Report given to RN and Post -op Vital signs reviewed and stable  Post vital signs: Reviewed  Last Vitals:  Filed Vitals:   12/12/14 1100  BP: 101/41  Pulse: 83  Temp:   Resp: 13    Complications: No apparent anesthesia complications

## 2014-12-12 NOTE — Progress Notes (Signed)
Pt off floor to OR. Pt manually ventilated to OR by CRNA.

## 2014-12-12 NOTE — Anesthesia Postprocedure Evaluation (Signed)
  Anesthesia Post-op Note  Patient: Allison [name]  Procedure(s) Performed: Procedure(s): EXPLORATORY LAPAROTOMY, RENASTAMOSISED SMALL BOWEL (N/A)  Patient Location: PACU and ICU  Anesthesia Type:General  Level of Consciousness: sedated  Airway and Oxygen Therapy: Patient remains intubated per anesthesia plan  Post-op Pain: none  Post-op Assessment: Post-op Vital signs reviewed              Post-op Vital Signs: Reviewed  Last Vitals:  Filed Vitals:   12/12/14 1100  BP: 101/41  Pulse: 83  Temp:   Resp: 13    Complications: No apparent anesthesia complications

## 2014-12-13 ENCOUNTER — Inpatient Hospital Stay (HOSPITAL_COMMUNITY): Payer: BC Managed Care – PPO

## 2014-12-13 ENCOUNTER — Encounter (HOSPITAL_COMMUNITY): Payer: Self-pay | Admitting: General Surgery

## 2014-12-13 DIAGNOSIS — E1165 Type 2 diabetes mellitus with hyperglycemia: Secondary | ICD-10-CM

## 2014-12-13 DIAGNOSIS — E1129 Type 2 diabetes mellitus with other diabetic kidney complication: Secondary | ICD-10-CM

## 2014-12-13 LAB — GLUCOSE, CAPILLARY
GLUCOSE-CAPILLARY: 167 mg/dL — AB (ref 65–99)
GLUCOSE-CAPILLARY: 117 mg/dL — AB (ref 65–99)
Glucose-Capillary: 139 mg/dL — ABNORMAL HIGH (ref 65–99)
Glucose-Capillary: 187 mg/dL — ABNORMAL HIGH (ref 65–99)
Glucose-Capillary: 168 mg/dL — ABNORMAL HIGH (ref 65–99)
Glucose-Capillary: 182 mg/dL — ABNORMAL HIGH (ref 65–99)

## 2014-12-13 LAB — BASIC METABOLIC PANEL
ANION GAP: 7 (ref 5–15)
BUN: 21 mg/dL — AB (ref 6–20)
CHLORIDE: 112 mmol/L — AB (ref 101–111)
CO2: 21 mmol/L — ABNORMAL LOW (ref 22–32)
CREATININE: 0.73 mg/dL (ref 0.44–1.00)
Calcium: 7.6 mg/dL — ABNORMAL LOW (ref 8.9–10.3)
GFR calc Af Amer: >60 mL/min (ref >60)
GFR calc non Af Amer: >60 mL/min (ref >60)
Glucose, Bld: 208 mg/dL — ABNORMAL HIGH (ref 65–99)
Potassium: 4.2 mmol/L (ref 3.5–5.1)
Sodium: 140 mmol/L (ref 135–145)

## 2014-12-13 LAB — CBC
HCT: 30.8 % — ABNORMAL LOW (ref 36.0–46.0)
Hemoglobin: 10.1 g/dL — ABNORMAL LOW (ref 12.0–15.0)
MCH: 30.3 pg (ref 26.0–34.0)
MCHC: 32.8 g/dL (ref 30.0–36.0)
MCV: 92.5 fL (ref 78.0–100.0)
Platelets: 481 10*3/uL — ABNORMAL HIGH (ref 150–400)
RBC: 3.33 MIL/uL — ABNORMAL LOW (ref 3.87–5.11)
RDW: 15.1 % (ref 11.5–15.5)
WBC: 30.7 10*3/uL — ABNORMAL HIGH (ref 4.0–10.5)

## 2014-12-13 LAB — VANCOMYCIN, TROUGH: VANCOMYCIN TR: 9 ug/mL — AB (ref 10.0–20.0)

## 2014-12-13 LAB — TRIGLYCERIDES: TRIGLYCERIDES: 231 mg/dL — AB (ref <150)

## 2014-12-13 LAB — PHOSPHORUS: PHOSPHORUS: 4.2 mg/dL (ref 2.5–4.6)

## 2014-12-13 LAB — MAGNESIUM: MAGNESIUM: 2.1 mg/dL (ref 1.7–2.4)

## 2014-12-13 MED ORDER — SODIUM CHLORIDE 0.45 % IV SOLN
INTRAVENOUS | Status: AC
  Administered 2014-12-13 – 2014-12-14 (×2): 75 mL via INTRAVENOUS

## 2014-12-13 MED ORDER — SODIUM CHLORIDE 0.9 % IV SOLN
INTRAVENOUS | Status: DC
  Administered 2014-12-13: 20 mL/h via INTRAVENOUS

## 2014-12-13 MED ORDER — VANCOMYCIN HCL IN DEXTROSE 1-5 GM/200ML-% IV SOLN
1000.0000 mg | Freq: Three times a day (TID) | INTRAVENOUS | Status: DC
  Administered 2014-12-13 – 2014-12-15 (×5): 1000 mg via INTRAVENOUS
  Filled 2014-12-13 (×7): qty 200

## 2014-12-13 NOTE — Progress Notes (Signed)
Utilization review completed.  

## 2014-12-13 NOTE — Anesthesia Postprocedure Evaluation (Signed)
Anesthesia Post Note  Patient: Allison [name]  Procedure(s) Performed: Procedure(s) (LRB): EXPLORATORY LAPAROTOMY, APPENDECTOMY, SMALL BOWEL RESECTION, WOUND VAC PLACEMENT (N/A)  Anesthesia type: General  Patient location: PACU  Post pain: unable to assess   Post assessment: Post-op Vital signs reviewed  Last Vitals: BP 119/53 mmHg  Pulse 85  Temp(Src) 37.8 C (Axillary)  Resp 21  Ht 5\' 3"  (1.6 m)  Wt 297 lb 6.4 oz (134.9 kg)  BMI 52.70 kg/m2  SpO2 97%  Post vital signs: Reviewed  Level of consciousness: sedated/intubated  Complications: No apparent anesthesia complications

## 2014-12-13 NOTE — Progress Notes (Signed)
ANTIBIOTIC CONSULT NOTE - FOLLOW UP  Pharmacy Consult for Vancomycin Indication: Peritonitis  Labs:  Recent Labs  12/11/14 0408 12/12/14 0400 12/13/14 0330  WBC 13.9* 19.4* 30.7*  HGB 10.2* 9.1* 10.1*  PLT 368 413* 481*  CREATININE 1.14* 0.94 0.73   Estimated Creatinine Clearance: 109.6 mL/min (by C-G formula based on Cr of 0.73).  Recent Labs  12/13/14 1300  VANCOTROUGH 9*     Microbiology: Recent Results (from the past 720 hour(s))  Anaerobic culture     Status: None (Preliminary result)   Collection Time: 12/10/14  1:52 PM  Result Value Ref Range Status   Specimen Description FLUID PERITONEAL  Final   Special Requests ON A SWAB  Final   Gram Stain   Final    MODERATE WBC PRESENT,BOTH PMN AND MONONUCLEAR FEW GRAM POSITIVE COCCI IN PAIRS RARE GRAM POSITIVE COCCI IN CHAINS Results Called to: Chrisandra Carota [id number] WIL[address]K CONFIRMED BY K WILDER    Culture   Final    NO ANAEROBES ISOLATED; CULTURE IN PROGRESS FOR 5 DAYS   Report Status PENDING  Incomplete  Body fluid culture     Status: None (Preliminary result)   Collection Time: 12/10/14  1:52 PM  Result Value Ref Range Status   Specimen Description FLUID PERITONEAL  Final   Special Requests ON A SWAB  Final   Gram Stain   Final    MODERATE WBC PRESENT,BOTH PMN AND MONONUCLEAR RARE GRAM POSITIVE COCCI IN PAIRS Results Called toChrisandra Carota [id number] WILDERK    Culture   Final    RARE GRAM NEGATIVE RODS MODERATE UNIDENTIFIED ORGANISM    Report Status PENDING  Incomplete  MRSA PCR Screening     Status: None   Collection Time: 12/10/14  9:43 PM  Result Value Ref Range Status   MRSA by PCR NEGATIVE NEGATIVE Final    Comment:        The GeneXpert MRSA Assay (FDA approved for NASAL specimens only), is one component of a comprehensive MRSA colonization surveillance program. It is not intended to diagnose MRSA infection nor to guide or monitor treatment for MRSA infections.   Culture, blood (routine  x 2)     Status: None (Preliminary result)   Collection Time: 12/11/14 10:49 AM  Result Value Ref Range Status   Specimen Description BLOOD LEFT ARM  Final   Special Requests BOTTLES DRAWN AEROBIC ONLY 10CC  Final   Culture NO GROWTH 1 DAY  Final   Report Status PENDING  Incomplete  Culture, blood (routine x 2)     Status: None (Preliminary result)   Collection Time: 12/11/14 10:55 AM  Result Value Ref Range Status   Specimen Description BLOOD LEFT ARM  Final   Special Requests BOTTLES DRAWN AEROBIC ONLY 2CC  Final   Culture NO GROWTH 1 DAY  Final   Report Status PENDING  Incomplete   Assessment: 54 yo [name] admitted 12/07/2014 with several days of abdominal pain >Ruptured appendix.  Pharmacy consulted to dose vancomycin.     Infectious Disease: Perotonitis,  in setting of perforated appendix; with GPC in pairs/chains in fluid cx.  Tmax/24h: 102, WBC up significantly   Vanc 6/17 (no LD given) >> 6/20 VT reported at /dl Erta 8/65 >> Cipro 7/84 >> 6/16 Flagyl 6/15 >> 6/16 Zosyn 6/15 x 1  6/18 BCx >> 6/17 Peritoneal fluid cx >> GS showing GPC in pairs >> pending  Nephrology: SCr 0.94, CrCl~90 ml/mi n(normalized)  Goal of Therapy:  Vancomycin  trough level 15-20 mcg/ml  Plan:  Change vancomycin to 1g IV q8h Follow up SCr, UOP, cultures, clinical course and adjust as clinically indicated.  Thank you for allowing pharmacy to be a part of this patients care team.  Lovenia Kim Pharm.D., BCPS, AQ-Cardiology Clinical Pharmacist 12/13/2014 3:31 PM Pager: [phone] Phone: [phone]

## 2014-12-13 NOTE — Progress Notes (Signed)
1 Day Post-Op  Subjective: Intubated and sedated on vent.  Objective: Vital signs in last 24 hours: Temp:  [100.1 F (37.8 C)-102 F (38.9 C)] 100.1 F (37.8 C) (06/20 0740) Pulse Rate:  [78-107] 81 (06/20 0813) Resp:  [13-33] 21 (06/20 0813) BP: (86-146)/(33-62) 103/38 mmHg (06/20 0813) SpO2:  [91 %-100 %] 97 % (06/20 0813) Arterial Line BP: (58)/(51) 58/51 mmHg (06/19 0900) FiO2 (%):  [40 %] 40 % (06/20 0813) Weight:  [134.9 kg (297 lb 6.4 oz)] 134.9 kg (297 lb 6.4 oz) (06/20 0400) Last BM Date: 12/09/14  Intake/Output from previous day: 06/19 0701 - 06/20 0700 In: 2618.4 [I.V.:1858.4; NG/GT:110; IV Piggyback:650] Out: 4150 [Urine:1750; Emesis/NG output:625; Drains:1775] Intake/Output this shift:    Resp: clear to auscultation bilaterally and on vent Cardio: regular rate and rhythm GI: soft, vac in place  Lab Results:   Recent Labs  12/12/14 0400 12/13/14 0330  WBC 19.4* 30.7*  HGB 9.1* 10.1*  HCT 28.1* 30.8*  PLT 413* 481*   BMET  Recent Labs  12/12/14 0400 12/13/14 0330  NA 140 140  K 3.8 4.2  CL 113* 112*  CO2 21* 21*  GLUCOSE 167* 208*  BUN 27* 21*  CREATININE 0.94 0.73  CALCIUM 7.5* 7.6*   PT/INR  Recent Labs  12/10/14 2028  LABPROT 17.5*  INR 1.43   ABG  Recent Labs  12/10/14 2033 12/11/14 0401  PHART 7.310* 7.295*  HCO3 17.3* 17.5*    Studies/Results: Dg Chest Port 1 View  12/13/2014   CLINICAL DATA:  Acute respiratory failure.  EXAM: PORTABLE CHEST - 1 VIEW  COMPARISON:  12/12/2014 .  FINDINGS: Endotracheal tube, NG tube, right PICC line, left IJ line in stable position. Cardiomegaly. Bibasilar atelectasis and/or infiltrates are present. No pleural effusion or pneumothorax.  IMPRESSION: 1. Lines and tubes in stable position. 2. Bibasilar atelectasis and/or infiltrates. These changes are new from prior exam. 3. Stable cardiomegaly.  No pulmonary venous congestion.   Electronically Signed   By: Maisie Fus  Register   On: 12/13/2014 07:28    Dg Chest Port 1 View  12/12/2014   CLINICAL DATA:  Acute respiratory failure, short of breath  EXAM: PORTABLE CHEST - 1 VIEW  COMPARISON:  Radiograph 12/11/2014  FINDINGS: Endotracheal tube and NG tube are unchanged. Right PICC line with tip at the cavoatrial junction. Low lung volumes. Normal cardiac silhouette. No pulmonary edema.  IMPRESSION: 1. Stable support apparatus. 2. Right PICC line with tip at the cavoatrial junction. 3. No significant interval change.   Electronically Signed   By: Genevive Bi M.D.   On: 12/12/2014 07:58    Anti-infectives: Anti-infectives    Start     Dose/Rate Route Frequency Ordered Stop   12/11/14 1400  vancomycin (VANCOCIN) IVPB 1000 mg/200 mL premix     1,000 mg 200 mL/hr over 60 Minutes Intravenous Every 12 hours 12/11/14 1300     12/10/14 1700  vancomycin (VANCOCIN) 1,500 mg in sodium chloride 0.9 % 500 mL IVPB  Status:  Discontinued     1,500 mg 250 mL/hr over 120 Minutes Intravenous Every 24 hours 12/10/14 1601 12/11/14 1300   12/09/14 1100  ertapenem (INVANZ) 1 g in sodium chloride 0.9 % 50 mL IVPB  Status:  Discontinued    Comments:  She has hives from pcn before, the cross reactivity is low and I think there is some benefit to hopefully avoiding surgery in this patient without other good choices for abx, I think cipro/flagyl has too much  resistance and she has perforation   1 g 100 mL/hr over 30 Minutes Intravenous Every 24 hours 12/09/14 0940 12/09/14 1017   12/09/14 1100  ertapenem (INVANZ) 1 g in sodium chloride 0.9 % 50 mL IVPB  Status:  Discontinued    Comments:  She has hives from pcn before, the cross reactivity is low and I think there is some benefit to hopefully avoiding surgery in this patient without other good choices for abx, I think cipro/flagyl has too much resistance and she has perforation   1 g 100 mL/hr over 30 Minutes Intravenous Every 24 hours 12/09/14 1017 12/09/14 1018   12/09/14 1100  ertapenem (INVANZ) 1 g in sodium  chloride 0.9 % 50 mL IVPB    Comments:  She has hives from pcn before, the cross reactivity is low and I think there is some benefit to hopefully avoiding surgery in this patient without other good choices for abx, I think cipro/flagyl has too much resistance and she has perforation   1 g 100 mL/hr over 30 Minutes Intravenous Every 24 hours 12/09/14 1020     12/09/14 0945  ertapenem (INVANZ) injection 1 g  Status:  Discontinued    Comments:  She has hives from pcn before, the cross reactivity is low and I think there is some benefit to hopefully avoiding surgery in this patient without other good choices for abx, I think cipro/flagyl has too much resistance and she has perforation   1 g Intramuscular Every 24 hours 12/09/14 0937 12/09/14 0941   12/08/14 0800  ciprofloxacin (CIPRO) IVPB 400 mg  Status:  Discontinued     400 mg 200 mL/hr over 60 Minutes Intravenous Every 12 hours 12/08/14 0652 12/09/14 0933   12/08/14 0800  metroNIDAZOLE (FLAGYL) IVPB 500 mg  Status:  Discontinued     500 mg 100 mL/hr over 60 Minutes Intravenous Every 8 hours 12/08/14 0652 12/09/14 0933   12/08/14 0330  piperacillin-tazobactam (ZOSYN) IVPB 3.375 g     3.375 g 100 mL/hr over 30 Minutes Intravenous  Once 12/08/14 0328 12/08/14 0513      Assessment/Plan: s/p Procedure(s): EXPLORATORY LAPAROTOMY, RENASTAMOSISED SMALL BOWEL (N/A) Continue abx  VDRF and critical care per CCM Plan for vac change and possible abdominal closure tomorrow  LOS: 5 days    TOTH III,PAUL S 12/13/2014

## 2014-12-13 NOTE — Progress Notes (Signed)
PULMONARY / CRITICAL CARE MEDICINE   Name: Allison [name] MRN: [id number] DOB: [birth year]    ADMISSION DATE:  12/07/2014 CONSULTATION DATE:  6/17  REFERRING MD :  Dwain Sarna   CHIEF COMPLAINT:   Severe sepsis Assist w/ vent management   INITIAL PRESENTATION:   69 yof who was admitted 6/14  W/ 2 night h/o perforated appendicitis. She appeared to improve initially with abx and resuscitation Then on 6/17 she had more pain, emesis, no more bowel function and her abdomen was more tender. Her wbc was rising. She went for emergent exploration, found to have severe peritonitis as well as evidence of bowel ischemia. She underwent appendectomy, SB resection and was left in discontinuity. Returned to the ICU on vent. PCCM asked to assist w/ care.   STUDIES:  CT abd/pelvis 6/15: Inflammatory process demonstrated throughout the pelvis with fluid and infiltration in the low pelvis and mesenteric. At appendicolith with abnormal appearing appendix, likely this represents perforated appendicitis. No discrete abscess is identified. There is small bowel wall thickening of the pelvic loops with proximal small bowel obstruction.  SIGNIFICANT EVENTS: Exp lap 6/17  INTERVAL EVENTS / SUBJECTIVE:  Febrile sedated on propofol & fent gtt, RASS-2 Good UO Weaning on PS 10/5  VITAL SIGNS: Temp:  [100.1 F (37.8 C)-102 F (38.9 C)] 100.1 F (37.8 C) (06/20 0740) Pulse Rate:  [78-107] 81 (06/20 0813) Resp:  [13-33] 21 (06/20 0813) BP: (86-146)/(33-62) 103/38 mmHg (06/20 0813) SpO2:  [91 %-100 %] 97 % (06/20 0813) Arterial Line BP: (58)/(51) 58/51 mmHg (06/19 0900) FiO2 (%):  [40 %] 40 % (06/20 0813) Weight:  [134.9 kg (297 lb 6.4 oz)] 134.9 kg (297 lb 6.4 oz) (06/20 0400) HEMODYNAMICS: CVP:  [8 mmHg-16 mmHg] 10 mmHg VENTILATOR SETTINGS: Vent Mode:  [-] PRVC FiO2 (%):  [40 %] 40 % Set Rate:  [20 bmp] 20 bmp Vt Set:  [500 mL] 500 mL PEEP:  [5 cmH20] 5 cmH20 Plateau Pressure:  [19 cmH20-30 cmH20] 22  cmH20 INTAKE / OUTPUT:  Intake/Output Summary (Last 24 hours) at 12/13/14 0837 Last data filed at 12/13/14 0600  Gross per 24 hour  Intake 2516.57 ml  Output   3745 ml  Net -1228.43 ml    PHYSICAL EXAMINATION: General:  Acutely ill  Neuro:  Sedated on vent , RASS -2 HEENT:  Orally intubated. Neck large, bite block in place Cardiovascular:  Rrr, no M, 80's Lungs:  Decreased bases  Abdomen:  Obese, vac dressing intact  Musculoskeletal:  Intact  Skin:  Intact   LABS:  CBC  Recent Labs Lab 12/11/14 0408 12/12/14 0400 12/13/14 0330  WBC 13.9* 19.4* 30.7*  HGB 10.2* 9.1* 10.1*  HCT 31.2* 28.1* 30.8*  PLT 368 413* 481*   Coag's  Recent Labs Lab 12/10/14 2028  APTT 44*  INR 1.43   BMET  Recent Labs Lab 12/11/14 0408 12/12/14 0400 12/13/14 0330  NA 136 140 140  K 3.9 3.8 4.2  CL 109 113* 112*  CO2 19* 21* 21*  BUN 28* 27* 21*  CREATININE 1.14* 0.94 0.73  GLUCOSE 256* 167* 208*   Electrolytes  Recent Labs Lab 12/10/14 1607 12/11/14 0408 12/12/14 0400 12/13/14 0330  CALCIUM 6.8* 7.0* 7.5* 7.6*  MG 1.7  --  2.3 2.1  PHOS  --   --  2.4* 4.2   Sepsis Markers  Recent Labs Lab 12/08/14 0328 12/10/14 1700 12/12/14 0400  LATICACIDVEN 1.15 1.0 0.7   ABG  Recent Labs Lab 12/10/14 1812 12/10/14 2033  12/11/14 0401  PHART 7.273* 7.310* 7.295*  PCO2ART 35.6 34.4* 35.9  PO2ART 85.0 89.0 83.0   Liver Enzymes  Recent Labs Lab 12/07/14 1930 12/08/14 0820  AST 22 22  ALT 18 16  ALKPHOS 65 54  BILITOT 0.8 0.5  ALBUMIN 2.9* 2.1*   Cardiac Enzymes  Recent Labs Lab 12/08/14 0507 12/08/14 1057 12/08/14 1800  TROPONINI 0.06* <0.03 <0.03   Glucose  Recent Labs Lab 12/12/14 1007 12/12/14 1627 12/12/14 1945 12/12/14 2326 12/13/14 0333 12/13/14 0738  GLUCAP 141* 172* 159* 139* 187* 168*    Imaging Dg Chest Port 1 View  12/13/2014   CLINICAL DATA:  Acute respiratory failure.  EXAM: PORTABLE CHEST - 1 VIEW  COMPARISON:  12/12/2014 .   FINDINGS: Endotracheal tube, NG tube, right PICC line, left IJ line in stable position. Cardiomegaly. Bibasilar atelectasis and/or infiltrates are present. No pleural effusion or pneumothorax.  IMPRESSION: 1. Lines and tubes in stable position. 2. Bibasilar atelectasis and/or infiltrates. These changes are new from prior exam. 3. Stable cardiomegaly.  No pulmonary venous congestion.   Electronically Signed   By: Maisie Fus  Register   On: 12/13/2014 07:28   Image reviewed  ASSESSMENT / PLAN:  PULMONARY OETT 6/17 >>  A: Acute respiratory failure in setting of shock Probable OSA  P:   SBTs with high PS Defer extubation until abdomen closed  CARDIOVASCULAR CVL 6/17 PICC >>> A:  Severe sepsis in setting of peritonitis, lactate reassuring 6/17pm HTN Bigeminy in OR, resolved P:  MAP goal >65   RENAL A:   AKI, improving  NAG metabolic acidosis  P:   Decrease MIVF w/ NS Strict I&O   GASTROINTESTINAL A:   Ruptured appendix Peritonitis  Ischemic bowel Now s/p expl lap, appendectomy, SB resection, and placement of wound vac 6/17; bowel left in discontinuity  P:  Nutrition per surg. Anticipate post-op ileus as bowel left in discontinuity  Wound care per surg  For return to OR in next 24-48 hrs Consider TNA   HEMATOLOGIC A:  Anemia  P:  Trend CBC [place] heparin  Transfuse for hgb < 7   INFECTIOUS Peritoneal fluid 6/17 >> GPC >  Blood cx 6/18 >>  A:   Sepsis Peritonitis in setting of ruptured appendix  Possible bacterial translocation d/t ischemic bowel  P:    invanz 6/15>>> vanco 6/18 >>   Continue current abx. Consider empiric antifungals if decompensates in any way given perforation.   ENDOCRINE A:   Type II DM Hyperglycemia  P:   q4h ssi    NEUROLOGIC A:   Acute encephalopathy  Pain  P:   RASS goal: -2 while open abdomen PAD protocol will use prop and fent   WUA daily   FAMILY  - Updates: no family at bedside  - Inter-disciplinary family meet  or Palliative Care meeting due by: NA    TODAY'S SUMMARY:  Peritonitis from ruptured appendix and ischemic bowel.  Extubation only after abdomen closed, Tailor abx once peritoneal fluid cx back  The patient is critically ill with multiple organ systems failure and requires high complexity decision making for assessment and support, frequent evaluation and titration of therapies, application of advanced monitoring technologies and extensive interpretation of multiple databases. Critical Care Time devoted to patient care services described in this note independent of APP time is 35 minutes.   Cyril Mourning MD. Tonny Bollman. [facility] Pager [phone] If no response call [phone]     12/13/2014, 8:37 AM

## 2014-12-14 ENCOUNTER — Inpatient Hospital Stay (HOSPITAL_COMMUNITY): Payer: BC Managed Care – PPO | Admitting: Certified Registered Nurse Anesthetist

## 2014-12-14 ENCOUNTER — Encounter (HOSPITAL_COMMUNITY): Admission: EM | Disposition: A | Discharge status: Skilled Nursing Facility | Payer: Self-pay | Source: Home / Self Care

## 2014-12-14 ENCOUNTER — Encounter (HOSPITAL_COMMUNITY): Payer: Self-pay | Admitting: Certified Registered Nurse Anesthetist

## 2014-12-14 ENCOUNTER — Inpatient Hospital Stay (HOSPITAL_COMMUNITY): Payer: BC Managed Care – PPO

## 2014-12-14 HISTORY — PX: VACUUM ASSISTED CLOSURE CHANGE: SHX5227

## 2014-12-14 LAB — COMPREHENSIVE METABOLIC PANEL
ALK PHOS: 53 U/L (ref 38–126)
ALT: 12 U/L — ABNORMAL LOW (ref 14–54)
ANION GAP: 5 (ref 5–15)
AST: 19 U/L (ref 15–41)
Albumin: 1.2 g/dL — ABNORMAL LOW (ref 3.5–5.0)
BILIRUBIN TOTAL: 0.7 mg/dL (ref 0.3–1.2)
BUN: 18 mg/dL (ref 6–20)
CHLORIDE: 113 mmol/L — AB (ref 101–111)
CO2: 23 mmol/L (ref 22–32)
CREATININE: 0.62 mg/dL (ref 0.44–1.00)
Calcium: 7.5 mg/dL — ABNORMAL LOW (ref 8.9–10.3)
GFR calc non Af Amer: >60 mL/min (ref >60)
GLUCOSE: 166 mg/dL — AB (ref 65–99)
POTASSIUM: 3.8 mmol/L (ref 3.5–5.1)
Sodium: 141 mmol/L (ref 135–145)
Total Protein: 4.3 g/dL — ABNORMAL LOW (ref 6.5–8.1)

## 2014-12-14 LAB — GLUCOSE, CAPILLARY
GLUCOSE-CAPILLARY: 138 mg/dL — AB (ref 65–99)
GLUCOSE-CAPILLARY: 155 mg/dL — AB (ref 65–99)
Glucose-Capillary: 147 mg/dL — ABNORMAL HIGH (ref 65–99)
Glucose-Capillary: 158 mg/dL — ABNORMAL HIGH (ref 65–99)
Glucose-Capillary: 156 mg/dL — ABNORMAL HIGH (ref 65–99)
Glucose-Capillary: 125 mg/dL — ABNORMAL HIGH (ref 65–99)

## 2014-12-14 LAB — CBC
HEMATOCRIT: 25.9 % — AB (ref 36.0–46.0)
HEMOGLOBIN: 8.3 g/dL — AB (ref 12.0–15.0)
MCH: 30 pg (ref 26.0–34.0)
MCHC: 32 g/dL (ref 30.0–36.0)
MCV: 93.5 fL (ref 78.0–100.0)
Platelets: 445 10*3/uL — ABNORMAL HIGH (ref 150–400)
RBC: 2.77 MIL/uL — ABNORMAL LOW (ref 3.87–5.11)
RDW: 15.2 % (ref 11.5–15.5)
WBC: 21.6 10*3/uL — ABNORMAL HIGH (ref 4.0–10.5)

## 2014-12-14 LAB — BODY FLUID CULTURE

## 2014-12-14 SURGERY — REPLACEMENT, WOUND VAC DRESSING, ABDOMEN
Anesthesia: General | Site: Abdomen

## 2014-12-14 MED ORDER — FENTANYL CITRATE (PF) 250 MCG/5ML IJ SOLN
INTRAMUSCULAR | Status: AC
  Filled 2014-12-14: qty 5

## 2014-12-14 MED ORDER — ROCURONIUM BROMIDE 50 MG/5ML IV SOLN
INTRAVENOUS | Status: AC
  Filled 2014-12-14: qty 1

## 2014-12-14 MED ORDER — PROPOFOL 10 MG/ML IV BOLUS
INTRAVENOUS | Status: AC
  Filled 2014-12-14: qty 20

## 2014-12-14 MED ORDER — LACTATED RINGERS IV SOLN
INTRAVENOUS | Status: DC | PRN
  Administered 2014-12-14: 11:00:00 via INTRAVENOUS

## 2014-12-14 MED ORDER — LIDOCAINE HCL (CARDIAC) 20 MG/ML IV SOLN
INTRAVENOUS | Status: AC
  Filled 2014-12-14: qty 5

## 2014-12-14 MED ORDER — FENTANYL CITRATE (PF) 250 MCG/5ML IJ SOLN
INTRAMUSCULAR | Status: DC | PRN
  Administered 2014-12-14 (×2): 50 ug via INTRAVENOUS

## 2014-12-14 MED ORDER — SUCCINYLCHOLINE CHLORIDE 20 MG/ML IJ SOLN
INTRAMUSCULAR | Status: AC
  Filled 2014-12-14: qty 1

## 2014-12-14 MED ORDER — ROCURONIUM BROMIDE 100 MG/10ML IV SOLN
INTRAVENOUS | Status: DC | PRN
  Administered 2014-12-14: 30 mg via INTRAVENOUS
  Administered 2014-12-14: 50 mg via INTRAVENOUS

## 2014-12-14 MED ORDER — MIDAZOLAM HCL 5 MG/5ML IJ SOLN
INTRAMUSCULAR | Status: DC | PRN
  Administered 2014-12-14: 1 mg via INTRAVENOUS

## 2014-12-14 MED ORDER — 0.9 % SODIUM CHLORIDE (POUR BTL) OPTIME
TOPICAL | Status: DC | PRN
  Administered 2014-12-14 (×2): 1000 mL

## 2014-12-14 MED ORDER — DEXAMETHASONE SODIUM PHOSPHATE 4 MG/ML IJ SOLN
INTRAMUSCULAR | Status: AC
  Filled 2014-12-14: qty 1

## 2014-12-14 MED ORDER — MIDAZOLAM HCL 2 MG/2ML IJ SOLN
INTRAMUSCULAR | Status: AC
  Filled 2014-12-14: qty 2

## 2014-12-14 MED ORDER — PROPOFOL 1000 MG/100ML IV EMUL
INTRAVENOUS | Status: AC
  Filled 2014-12-14: qty 100

## 2014-12-14 SURGICAL SUPPLY — 29 items
CANISTER SUCTION 2500CC (MISCELLANEOUS) ×3 IMPLANT
CANISTER WOUND CARE 500ML ATS (WOUND CARE) ×3 IMPLANT
COVER SURGICAL LIGHT HANDLE (MISCELLANEOUS) ×3 IMPLANT
DRAPE LAPAROSCOPIC ABDOMINAL (DRAPES) ×3 IMPLANT
DRAPE UTILITY XL STRL (DRAPES) ×6 IMPLANT
ELECT REM PT RETURN 9FT ADLT (ELECTROSURGICAL) ×3
ELECTRODE REM PT RTRN 9FT ADLT (ELECTROSURGICAL) ×1 IMPLANT
GLOVE BIO SURGEON STRL SZ7.5 (GLOVE) ×1 IMPLANT
GLOVE BIOGEL PI IND STRL 7.0 (GLOVE) IMPLANT
GLOVE BIOGEL PI INDICATOR 7.0 (GLOVE) ×6
GLOVE SURG SS PI 6.5 STRL IVOR (GLOVE) ×2 IMPLANT
GLOVE SURG SS PI 7.0 STRL IVOR (GLOVE) ×4 IMPLANT
GLOVE SURG SS PI 7.5 STRL IVOR (GLOVE) ×4 IMPLANT
GOWN STRL REUS W/ TWL LRG LVL3 (GOWN DISPOSABLE) ×2 IMPLANT
GOWN STRL REUS W/ TWL XL LVL3 (GOWN DISPOSABLE) IMPLANT
GOWN STRL REUS W/TWL LRG LVL3 (GOWN DISPOSABLE) ×12
GOWN STRL REUS W/TWL XL LVL3 (GOWN DISPOSABLE) ×3
HOVERMATT SINGLE USE (MISCELLANEOUS) ×2 IMPLANT
KIT BASIN OR (CUSTOM PROCEDURE TRAY) ×3 IMPLANT
KIT ROOM TURNOVER OR (KITS) ×3 IMPLANT
NS IRRIG 1000ML POUR BTL (IV SOLUTION) ×6 IMPLANT
PACK GENERAL/GYN (CUSTOM PROCEDURE TRAY) ×3 IMPLANT
PAD ARMBOARD 7.5X6 YLW CONV (MISCELLANEOUS) ×6 IMPLANT
SPONGE ABDOMINAL VAC ABTHERA (MISCELLANEOUS) ×3 IMPLANT
SUCTION POOLE TIP (SUCTIONS) ×2 IMPLANT
SUT NOVA 1 T20/GS 25DT (SUTURE) ×6 IMPLANT
TOWEL OR 17X24 6PK STRL BLUE (TOWEL DISPOSABLE) ×3 IMPLANT
TOWEL OR 17X26 10 PK STRL BLUE (TOWEL DISPOSABLE) ×3 IMPLANT
WATER STERILE IRR 1000ML POUR (IV SOLUTION) ×1 IMPLANT

## 2014-12-14 NOTE — Progress Notes (Signed)
Received patient from the OR at approximately 12:30 pt is on PS/CPAP 15/5 and stable. RT will continue to monitor.

## 2014-12-14 NOTE — Transfer of Care (Signed)
Immediate Anesthesia Transfer of Care Note  Patient: Allison [name]  Procedure(s) Performed: Procedure(s): ABDOMINAL VACUUM CHANGE (N/A)  Patient Location: SICU  Anesthesia Type:General  Level of Consciousness: sedated, unresponsive and Patient remains intubated per anesthesia plan  Airway & Oxygen Therapy: Patient remains intubated per anesthesia plan and Patient placed on Ventilator (see vital sign flow sheet for setting)  Post-op Assessment: Report given to RN and Post -op Vital signs reviewed and stable  Post vital signs: Reviewed and stable  Last Vitals:  Filed Vitals:   12/14/14 1100  BP: 113/49  Pulse: 75  Temp:   Resp: 15    Complications: No apparent anesthesia complications

## 2014-12-14 NOTE — Clinical Documentation Improvement (Signed)
Supporting Information: Anemia noted per 6/19 progress notes. EBL: 100 ml per 6/17 Anesthesia record  Labs:  H/H: 6/17:  11.6/35.6 6/21:   8.3/25.9  Treatment: 6/17:  Albumin 250 ml per 6/17 Anesthesia record.   Possible Clinical Condition: . Documentation of Anemia should include the type of anemia: --Nutritional --Hemolytic --Aplastic --Acute blood loss anemia --Acute on chronic blood loss anemia --Other (please specify) . Include in documentation if Anemia is due to nutrition or mineral deficits, resulting in a nutritional anemia . Document if the Anemia is due to a neoplasm (primary and/or secondary) . Document whether the ANEMIA is "related to or due to" chemo or radiotherapy treatments . Document any "cause-and-effect" relationship between the intervention and the blood or immune disorder . Document the specific drug if anemia is drug-induced . Link any laboratory findings to a related diagnosis (if appropriate) . Document any associated diagnoses/conditions    Thank Allison [name] Documentation Specialist [phone] Allison [name]

## 2014-12-14 NOTE — Anesthesia Postprocedure Evaluation (Signed)
  Anesthesia Post-op Note  Patient: Allison [name]  Procedure(s) Performed: Procedure(s): ABDOMINAL VACUUM CHANGE (N/A)  Patient Location: ICU  Anesthesia Type:General  Level of Consciousness: sedated and Patient remains intubated per anesthesia plan  Airway and Oxygen Therapy: Patient remains intubated per anesthesia plan  Post-op Pain: none  Post-op Assessment: Post-op Vital signs reviewed              Post-op Vital Signs: Reviewed  Last Vitals:  Filed Vitals:   12/14/14 1800  BP: 131/49  Pulse: 82  Temp:   Resp: 20    Complications: No apparent anesthesia complications

## 2014-12-14 NOTE — Progress Notes (Signed)
Patient ID: Allison [name], female   DOB: 03/24/1961, 54 y.o.   MRN: [id number] Plan for vac change and possible abdominal closure today

## 2014-12-14 NOTE — Op Note (Signed)
12/07/2014 - 12/14/2014  12:22 PM  PATIENT:  Allison [name]  54 y.o. female  PRE-OPERATIVE DIAGNOSIS:  OPEN ABDOMEN  POST-OPERATIVE DIAGNOSIS:  OPEN ABDOMEN  PROCEDURE:  Procedure(s): ABDOMINAL VACUUM CHANGE (N/A)  Partial closure of abdomen  SURGEON:  Surgeon(s) and Role:    * Griselda Miner, MD - Primary  PHYSICIAN ASSISTANT:   ASSISTANTS: Emina Reibock, PA   ANESTHESIA:   general  EBL:  Total I/O In: 168.6 [I.V.:168.6] Out: 430 [Urine:360; Drains:50; Blood:20]  BLOOD ADMINISTERED:none  DRAINS: Vacuum dressing   LOCAL MEDICATIONS USED:  NONE  SPECIMEN:  No Specimen  DISPOSITION OF SPECIMEN:  N/A  COUNTS:  YES  TOURNIQUET:  * No tourniquets in log *  DICTATION: .Dragon Dictation  After informed consent was obtained the patient was brought to the operating room and placed in the supine position on the operating room table. After adequate induction of general anesthesia the patient's abdomen was unpacked. The abdomen was then prepped with Betadine and draped in usual sterile manner. The abdomen was irrigated with copious amounts of saline. All 4 quadrants were examined. The bowel had the appearance of a bowel brain. No obvious areas of ischemia were appreciated. Several #1 Novafil stitches were placed at the superior and inferior edge of the open wound to try to close the fascia. This seemed to work well. The middle portion of the wound did not appear to be able to close this so the vacuum dressing was reapplied in this area. The patient tolerated the procedure well. At the end of the case all needle sponge and instrument counts were correct. The patient will remain intubated and be taken back to the ICU for further management.  PLAN OF CARE: Admit to inpatient   PATIENT DISPOSITION:  ICU - intubated and critically ill.   Delay start of Pharmacological VTE agent (>24hrs) due to surgical blood loss or risk of bleeding: no

## 2014-12-14 NOTE — Progress Notes (Signed)
Patient manually ventilated by CRNA from 2S15 to OR. RT will continue to monitor.

## 2014-12-14 NOTE — Progress Notes (Signed)
PULMONARY / CRITICAL CARE MEDICINE   Name: Allison [name] MRN: [id number] DOB: 1961/04/29    ADMISSION DATE:  12/07/2014 CONSULTATION DATE:  6/17  REFERRING MD :  Dwain Sarna   CHIEF COMPLAINT:   Severe sepsis Assist w/ vent management   INITIAL PRESENTATION:   54 yof who was admitted 6/14  W/ 2 night h/o perforated appendicitis. She appeared to improve initially with abx and resuscitation Then on 6/17 she had more pain, emesis, no more bowel function and her abdomen was more tender. Her wbc was rising. She went for emergent exploration, found to have severe peritonitis as well as evidence of bowel ischemia. She underwent appendectomy, SB resection and was left in discontinuity. Returned to the ICU on vent. PCCM asked to assist w/ care.   STUDIES:  CT abd/pelvis 6/15: Inflammatory process demonstrated throughout the pelvis with fluid and infiltration in the low pelvis and mesenteric. At appendicolith with abnormal appearing appendix, likely this represents perforated appendicitis. No discrete abscess is identified. There is small bowel wall thickening of the pelvic loops with proximal small bowel obstruction.  SIGNIFICANT EVENTS: Exp lap 6/17 6/19 reanastomosis, open abd vac  INTERVAL EVENTS / SUBJECTIVE:  aFebrile sedated on propofol & fent gtt, RASS-2 Good UO Weaning on PS 15/5  VITAL SIGNS: Temp:  [98.1 F (36.7 C)-100 F (37.8 C)] 99.9 F (37.7 C) (06/21 0720) Pulse Rate:  [31-88] 82 (06/21 0720) Resp:  [14-26] 22 (06/21 0720) BP: (87-126)/(34-57) 115/54 mmHg (06/21 0700) SpO2:  [96 %-100 %] 100 % (06/21 0720) FiO2 (%):  [40 %] 40 % (06/21 0720) Weight:  [296 lb 4.8 oz (134.4 kg)] 296 lb 4.8 oz (134.4 kg) (06/21 0500) HEMODYNAMICS: CVP:  [9 mmHg-19 mmHg] 16 mmHg VENTILATOR SETTINGS: Vent Mode:  [-] PRVC FiO2 (%):  [40 %] 40 % Set Rate:  [20 bmp] 20 bmp Vt Set:  [500 mL] 500 mL PEEP:  [5 cmH20] 5 cmH20 Pressure Support:  [15 cmH20] 15 cmH20 Plateau Pressure:  [16  cmH20-23 cmH20] 16 cmH20 INTAKE / OUTPUT:  Intake/Output Summary (Last 24 hours) at 12/14/14 0825 Last data filed at 12/14/14 0730  Gross per 24 hour  Intake 3329.41 ml  Output   3500 ml  Net -170.59 ml    PHYSICAL EXAMINATION: General:  Acutely ill  Neuro:  Sedated on vent , RASS -2 HEENT:  Orally intubated. Neck large, bite block in place Cardiovascular:  Rrr, no M, 80's Lungs:  Decreased bases  Abdomen:  Obese, vac dressing intact  Musculoskeletal:  Intact  Skin:  Intact   LABS:  CBC  Recent Labs Lab 12/12/14 0400 12/13/14 0330 12/14/14 0405  WBC 19.4* 30.7* 21.6*  HGB 9.1* 10.1* 8.3*  HCT 28.1* 30.8* 25.9*  PLT 413* 481* 445*   Coag's  Recent Labs Lab 12/10/14 2028  APTT 44*  INR 1.43   BMET  Recent Labs Lab 12/12/14 0400 12/13/14 0330 12/14/14 0405  NA 140 140 141  K 3.8 4.2 3.8  CL 113* 112* 113*  CO2 21* 21* 23  BUN 27* 21* 18  CREATININE 0.94 0.73 0.62  GLUCOSE 167* 208* 166*   Electrolytes  Recent Labs Lab 12/10/14 1607  12/12/14 0400 12/13/14 0330 12/14/14 0405  CALCIUM 6.8*  < > 7.5* 7.6* 7.5*  MG 1.7  --  2.3 2.1  --   PHOS  --   --  2.4* 4.2  --   < > = values in this interval not displayed. Sepsis Markers  Recent Labs Lab  12/08/14 0328 12/10/14 1700 12/12/14 0400  LATICACIDVEN 1.15 1.0 0.7   ABG  Recent Labs Lab 12/10/14 1812 12/10/14 2033 12/11/14 0401  PHART 7.273* 7.310* 7.295*  PCO2ART 35.6 34.4* 35.9  PO2ART 85.0 89.0 83.0   Liver Enzymes  Recent Labs Lab 12/07/14 1930 12/08/14 0820 12/14/14 0405  AST ALT 18 16 12*  ALKPHOS 65 54 53  BILITOT 0.8 0.5 0.7  ALBUMIN 2.9* 2.1* 1.2*   Cardiac Enzymes  Recent Labs Lab 12/08/14 0507 12/08/14 1057 12/08/14 1800  TROPONINI 0.06* <0.03 <0.03   Glucose  Recent Labs Lab 12/13/14 0738 12/13/14 1133 12/13/14 1517 12/13/14 1959 12/13/14 2343 12/14/14 0337  GLUCAP 168* 182* 167* 117* 147* 155*    Imaging Dg Chest Port 1  View  12/14/2014   CLINICAL DATA:  54 year old female with ventilator dependent respiratory failure. Initial encounter.  EXAM: PORTABLE CHEST - 1 VIEW  COMPARISON:  11/2014 and earlier.  FINDINGS: Portable AP semi upright view at 0545 hrs. Stable endotracheal tube, visible enteric tube, left IJ central line, and right side PICC line.  Mildly improved lung volumes and improved bibasilar ventilation with decreased patchy pulmonary opacity. No pneumothorax, pulmonary edema or pleural effusion. Stable cardiac size and mediastinal contours.  IMPRESSION: 1.  Stable lines and tubes. 2. Improved bibasilar ventilation with regressed bilateral pulmonary opacity. No new cardiopulmonary abnormality.   Electronically Signed   By: Odessa Fleming M.D.   On: 12/14/2014 07:34   Image reviewed  ASSESSMENT / PLAN:  PULMONARY OETT 6/17 >>  A: Acute respiratory failure in setting of shock Probable OSA  P:   SBTs with high PS Defer extubation until abdomen closed  CARDIOVASCULAR CVL 6/17 PICC >>> A:  Severe sepsis in setting of peritonitis, lactate reassuring 6/17pm HTN Bigeminy in OR, resolved P:  MAP goal >65   RENAL A:   AKI, improving  NAG metabolic acidosis  P:   1/2 NS @ 75 Strict I&O   GASTROINTESTINAL A:   Ruptured appendix Peritonitis  Ischemic bowel Now s/p expl lap, appendectomy, SB resection, and placement of wound vac 6/17; bowel left in discontinuity  P:  Nutrition per surg. Anticipate post-op ileus Wound care per surg  For return to OR for abd closure 6/21 Consider TNA   HEMATOLOGIC A:  Anemia  P:  Trend CBC [place] heparin  Transfuse for hgb < 7   INFECTIOUS Peritoneal fluid 6/17 >> strep, e coli  Blood cx 6/18 >>  ng A:   Sepsis Peritonitis in setting of ruptured appendix  Possible bacterial translocation d/t ischemic bowel  P:    invanz 6/15>>> vanco 6/18 >>   Can dc vanc in 24h - Consider empiric antifungals if decompensates in any way given perforation.    ENDOCRINE A:   Type II DM Hyperglycemia  P:   q4h ssi    NEUROLOGIC A:   Acute encephalopathy  Pain  P:   RASS goal: -2 while open abdomen PAD protocol -prop and fent gtt  WUA daily   FAMILY  - Updates: no family at bedside  - Inter-disciplinary family meet or Palliative Care meeting due by: NA    TODAY'S SUMMARY:  Peritonitis from ruptured appendix and ischemic bowel.  Extubation only after abdomen closed, would need TNA unless surgeons expect bowel function to return soon  The patient is critically ill with multiple organ systems failure and requires high complexity decision making for assessment and support, frequent evaluation and titration of therapies, application of  advanced monitoring technologies and extensive interpretation of multiple databases. Critical Care Time devoted to patient care services described in this note independent of APP time is 32 minutes.   Cyril Mourning MD. Tonny Bollman.  Pulmonary & Critical care Pager [phone] If no response call [phone]     12/14/2014, 8:25 AM

## 2014-12-14 NOTE — Clinical Documentation Improvement (Signed)
Supporting Information: Acute renal failure in the setting of shock per 6/18 progress notes.   Possible Clinical Condition: Septic Shock Cardiogenic Shock Hypovolemic Shock Hemorrhagic Shock Hyper / Hypoglycemic Shock Neurogenic Shock Anaphylactic Shock Other Condition Cannot Clinically determine    Thank You, Rodman Pickle ,RN Clinical Documentation Specialist:  [phone]  [facility]- Health Information Management

## 2014-12-14 NOTE — Anesthesia Postprocedure Evaluation (Signed)
  Anesthesia Post-op Note  Patient: Allison [name]  Procedure(s) Performed: Procedure(s): EXPLORATORY LAPAROTOMY, RENASTAMOSISED SMALL BOWEL (N/A)  Patient Location: PACU  Anesthesia Type:General  Level of Consciousness: awake  Airway and Oxygen Therapy: Patient Spontanous Breathing  Post-op Pain: mild  Post-op Assessment: Post-op Vital signs reviewed              Post-op Vital Signs: Reviewed  Last Vitals:  Filed Vitals:   12/14/14 1600  BP: 110/39  Pulse: 75  Temp:   Resp: 17    Complications: No apparent anesthesia complications

## 2014-12-15 ENCOUNTER — Inpatient Hospital Stay (HOSPITAL_COMMUNITY): Payer: BC Managed Care – PPO

## 2014-12-15 ENCOUNTER — Encounter (HOSPITAL_COMMUNITY): Payer: Self-pay | Admitting: General Surgery

## 2014-12-15 LAB — GLUCOSE, CAPILLARY
GLUCOSE-CAPILLARY: 129 mg/dL — AB (ref 65–99)
GLUCOSE-CAPILLARY: 132 mg/dL — AB (ref 65–99)
Glucose-Capillary: 138 mg/dL — ABNORMAL HIGH (ref 65–99)
Glucose-Capillary: 140 mg/dL — ABNORMAL HIGH (ref 65–99)
Glucose-Capillary: 145 mg/dL — ABNORMAL HIGH (ref 65–99)
Glucose-Capillary: 129 mg/dL — ABNORMAL HIGH (ref 65–99)

## 2014-12-15 LAB — BASIC METABOLIC PANEL
Anion gap: 8 (ref 5–15)
BUN: 11 mg/dL (ref 6–20)
CALCIUM: 7.5 mg/dL — AB (ref 8.9–10.3)
CO2: 23 mmol/L (ref 22–32)
Chloride: 109 mmol/L (ref 101–111)
Creatinine, Ser: 0.63 mg/dL (ref 0.44–1.00)
GFR calc Af Amer: >60 mL/min (ref >60)
GFR calc non Af Amer: >60 mL/min (ref >60)
GLUCOSE: 147 mg/dL — AB (ref 65–99)
POTASSIUM: 4.1 mmol/L (ref 3.5–5.1)
Sodium: 140 mmol/L (ref 135–145)

## 2014-12-15 LAB — CBC
HCT: 25.3 % — ABNORMAL LOW (ref 36.0–46.0)
Hemoglobin: 8.1 g/dL — ABNORMAL LOW (ref 12.0–15.0)
MCH: 29.9 pg (ref 26.0–34.0)
MCHC: 32 g/dL (ref 30.0–36.0)
MCV: 93.4 fL (ref 78.0–100.0)
Platelets: 481 10*3/uL — ABNORMAL HIGH (ref 150–400)
RBC: 2.71 MIL/uL — AB (ref 3.87–5.11)
RDW: 15 % (ref 11.5–15.5)
WBC: 17.6 10*3/uL — AB (ref 4.0–10.5)

## 2014-12-15 MED ORDER — SODIUM CHLORIDE 0.45 % IV SOLN: INTRAVENOUS | Status: DC

## 2014-12-15 MED ORDER — TRACE MINERALS CR-CU-F-FE-I-MN-MO-SE-ZN IV SOLN
INTRAVENOUS | Status: AC
  Administered 2014-12-15: 18:00:00 via INTRAVENOUS
  Filled 2014-12-15: qty 960

## 2014-12-15 NOTE — Progress Notes (Signed)
PARENTERAL NUTRITION CONSULT NOTE - INITIAL  Pharmacy Consult for TPN Indication: Prolonged ileus, NPO x ~7d  Allergies  Allergen Reactions  . Sulfonamide Derivatives Anaphylaxis  . Aspirin Nausea And Vomiting  . Penicillins Hives  . Latex Rash    Patient Measurements: Height: _0  (160 cm) Weight: 296 lb 4.8 oz (134.4 kg) IBW/kg (Calculated) : 52.4 Adjusted Body Weight: 72.9kg  Vital Signs: Temp: 100.1 F (37.8 C) (06/22 0726) Temp Source: Axillary (06/22 0726) BP: 117/54 mmHg (06/22 0700) Pulse Rate: 81 (06/22 0721) Intake/Output from previous day: 06/21 0701 - 06/22 0700 In: 3790.7 [I.V.:3140.7; IV Piggyback:650] Out: 2805 [Urine:1660; Emesis/NG output:500; Drains:625; Blood:20] Intake/Output from this shift: Total I/O In: -  Out: 225 [Urine:125; Drains:100]  Labs:  Recent Labs  12/13/14 0330 12/14/14 0405 12/15/14 0430  WBC 30.7* 21.6* 17.6*  HGB 10.1* 8.3* 8.1*  HCT 30.8* 25.9* 25.3*  PLT 481* 445* 481*     Recent Labs  12/13/14 0330 12/13/14 1625 12/14/14 0405 12/15/14 0430  NA 140  --  141 140  K 4.2  --  3.8 4.1  CL 112*  --  113* 109  CO2 21*  --  23 23  GLUCOSE 208*  --  166* 147*  BUN 21*  --  18 11  CREATININE 0.73  --  0.62 0.63  CALCIUM 7.6*  --  7.5* 7.5*  MG 2.1  --   --   --   PHOS 4.2  --   --   --   PROT  --   --  4.3*  --   ALBUMIN  --   --  1.2*  --   AST  --   --  19  --   ALT  --   --  12*  --   ALKPHOS  --   --  53  --   BILITOT  --   --  0.7  --   TRIG  --  231*  --   --    Estimated Creatinine Clearance: 109.4 mL/min (by C-G formula based on Cr of 0.63).    Recent Labs  12/14/14 2339 12/15/14 0331 12/15/14 0724  GLUCAP 138* 138* 140*    Medical History: Past Medical History  Diagnosis Date  . Non-ST elevated myocardial infarction (non-STEMI)   . Dysphasia   . Hypotension   . Hypokalemia   . Non-insulin dependent diabetes mellitus   . Hyperlipidemia   . Migraine headache   . Hemorrhoids   . History  of MRSA infection   . Major depressive disorder   . Bipolar disorder     Insulin Requirements in the past 24 hours:  12 units SSI q4h in past 24h  Assessment: 38 yof admitted 12/08/14 with 2 night hx of perforated appendix. Pt placed NPO and originally tried to medically manage. Appeared to improve initially with abx but had more pain, emesis, no more bowel function, abdomen more tender, wbc increasing. Went for emergent exploration and found to have severe peritonitis + bowel ischemia, underwent appendectomy and SBR. Transferred to ICU. Nutrition recommends TPN initiation if unable to extubate and initiate feeding within 7-10 days of intubation. Pharmacy consulted to initiate TPN on 12/15/14 due to anticipated prolonged ileus as bowel left in discontinuity. Pt NPO x ~7days inpatient  Surgeries/Procedures: 6/17:  Emergency exploration - appendectomy, SBR, abdominal wound vac 6/19: Ex-lap, SB anastomosis, possible abdomen closure (failed) 6/21: Abdominal vacuum change, partial abdomen closure 6/23: possible abdomen closure  GI: Ruptured appendix, peritonitis,  ischemic bowel s/p appendectomy/SBR/abdominal wound vac. Abdominal wall could not be closed with edema/dilation of SB. OGT in place. 555m/24h emesis, 7558m24 drain output. No bowel function yet - LBM 12/09/14. Anticipate post-op ileus as bowel left in discontinuity. To return to OR tomorrow for possible abdomen closure. PMH of dysphagia. PPI  Endo: DM - CBGS 125-156 prior to TPN  Lytes: K 4.1 (goal >/=4 with ileus), Mg 2.1 on 6/20 (goal >/=2 with ileus), Phos ok 4.2 on 6/20, coCa ok 9.8 (Ca x Phos 41, <55), others wnl  Renal: AKI on admit secondary to dehydration. SCr stable 0.63, normalized CrCl~74. UOP ok 0.5 ml/kg/h. 1/2NS_0   Pulm: VDRF (fiO2 40) - hopeful to close abdomen tomorrow and move towards extubation  Cards: BP soft, HR wnl. HLD, CAD. plavix  Hepatobil: LFTs/tbili/alk phos ok, TG 161>>231 (on propofol)  Neuro: Fent  gtt_1 , propofol gtt_2   ID: Ertapenem D#6 for septic shock/peritonitis. WBC improved 17.6, Tmax/24 100.6, currently afebrile. May consider antifungals. Pen allergy - hives  6/15 Ertapenem>> 6/18 Vanc>> 6/22  6/17 peritoneal fluid>> microaerophilic strep, Ecoli (no S) 6/18 BCx2>> ngtd  Best Practices: scds, heparin [place] TPN Access: PICC placed 6/15 TPN start date: 12/15/14>>  Current Nutrition:  Propofol has provided 732 fat kcal in last 24h NPO   Nutritional Goals:  1432 kCal, >130 grams of protein per day per previous Nutrition assessment (patient receiving ~732 fat kcal/day from propofol currently)  Plan:  - Initiate Clinimix E 5/15 at 40 ml/h  - will start low and advance to goal as tolerated. (TPN will provide 48g protein + 682 kcal) - Will not be able to achieve protein goal with hypocaloric feeding, particularly with added propofol fat kcal. - Daily multivitamin + trace elements in TPN - Continue SSI q4h and monitor CBGs - Decrease IVF to 35 ml/h with TPN initiation at 1800 - Mon TG q72h - change to weekly once propofol d/c'd - F/u TPN labs in AM  HaElicia LampPharmD Clinical Pharmacist - Resident Pager [phone]/22/2016 8:56 AM

## 2014-12-15 NOTE — Progress Notes (Signed)
PULMONARY / CRITICAL CARE MEDICINE   Name: Allison [name] MRN: [id number] DOB: 1960/10/19    ADMISSION DATE:  12/07/2014 CONSULTATION DATE:  6/17  REFERRING MD :  Dwain Sarna   CHIEF COMPLAINT:   Severe sepsis Assist w/ vent management   INITIAL PRESENTATION:   49 yof who was admitted 6/14  W/ 2 night h/o perforated appendicitis. She appeared to improve initially with abx and resuscitation Then on 6/17 she had more pain, emesis, no more bowel function and her abdomen was more tender. Her wbc was rising. She went for emergent exploration, found to have severe peritonitis as well as evidence of bowel ischemia. She underwent appendectomy, SB resection and was left in discontinuity. Returned to the ICU on vent. PCCM asked to assist w/ care.   STUDIES:  CT abd/pelvis 6/15: Inflammatory process demonstrated throughout the pelvis with fluid and infiltration in the low pelvis and mesenteric. At appendicolith with abnormal appearing appendix, likely this represents perforated appendicitis. No discrete abscess is identified. There is small bowel wall thickening of the pelvic loops with proximal small bowel obstruction.  SIGNIFICANT EVENTS: Exp lap 6/17 6/19 reanastomosis, open abd vac 6/21 partial abd closeure -vac  INTERVAL EVENTS / SUBJECTIVE:  Low gr Febrile sedated on propofol & fent gtt,agitated on WUA Good UO   VITAL SIGNS: Temp:  [99.2 F (37.3 C)-100.6 F (38.1 C)] 100.1 F (37.8 C) (06/22 0726) Pulse Rate:  [73-94] 81 (06/22 0721) Resp:  [15-23] 20 (06/22 0721) BP: (100-131)/(30-59) 117/54 mmHg (06/22 0700) SpO2:  [95 %-99 %] 98 % (06/22 0721) FiO2 (%):  [40 %] 40 % (06/22 0721) HEMODYNAMICS:   VENTILATOR SETTINGS: Vent Mode:  [-] PSV;CPAP FiO2 (%):  [40 %] 40 % Set Rate:  [20 bmp] 20 bmp Vt Set:  [500 mL] 500 mL PEEP:  [5 cmH20] 5 cmH20 Pressure Support:  [15 cmH20] 15 cmH20 Plateau Pressure:  [20 cmH20-24 cmH20] 20 cmH20 INTAKE / OUTPUT:  Intake/Output Summary  (Last 24 hours) at 12/15/14 0857 Last data filed at 12/15/14 0800  Gross per 24 hour  Intake 3658.9 ml  Output   2920 ml  Net  738.9 ml    PHYSICAL EXAMINATION: General:  Acutely ill  Neuro:  Sedated on vent , RASS -2 HEENT:  Orally intubated. Neck large, bite block in place Cardiovascular:  Rrr, no M, 80's Lungs:  Decreased bases  Abdomen:  Obese, vac dressing intact  Musculoskeletal:  Intact  Skin:  Intact   LABS:  CBC  Recent Labs Lab 12/13/14 0330 12/14/14 0405 12/15/14 0430  WBC 30.7* 21.6* 17.6*  HGB 10.1* 8.3* 8.1*  HCT 30.8* 25.9* 25.3*  PLT 481* 445* 481*   Coag's  Recent Labs Lab 12/10/14 2028  APTT 44*  INR 1.43   BMET  Recent Labs Lab 12/13/14 0330 12/14/14 0405 12/15/14 0430  NA 140 141 140  K 4.2 3.8 4.1  CL 112* 113* 109  CO2 21* 23 23  BUN 21* 18 11  CREATININE 0.73 0.62 0.63  GLUCOSE 208* 166* 147*   Electrolytes  Recent Labs Lab 12/10/14 1607  12/12/14 0400 12/13/14 0330 12/14/14 0405 12/15/14 0430  CALCIUM 6.8*  < > 7.5* 7.6* 7.5* 7.5*  MG 1.7  --  2.3 2.1  --   --   PHOS  --   --  2.4* 4.2  --   --   < > = values in this interval not displayed. Sepsis Markers  Recent Labs Lab 12/10/14 1700 12/12/14 0400  LATICACIDVEN  1.0 0.7   ABG  Recent Labs Lab 12/10/14 1812 12/10/14 2033 12/11/14 0401  PHART 7.273* 7.310* 7.295*  PCO2ART 35.6 34.4* 35.9  PO2ART 85.0 89.0 83.0   Liver Enzymes  Recent Labs Lab 12/14/14 0405  AST 19  ALT 12*  ALKPHOS 53  BILITOT 0.7  ALBUMIN 1.2*   Cardiac Enzymes  Recent Labs Lab 12/08/14 1057 12/08/14 1800  TROPONINI <0.03 <0.03   Glucose  Recent Labs Lab 12/14/14 0756 12/14/14 1534 12/14/14 1913 12/14/14 2339 12/15/14 0331 12/15/14 0724  GLUCAP 158* 156* 125* 138* 138* 140*    Imaging Dg Chest Port 1 View  12/15/2014   CLINICAL DATA:  Subsequent encounter for acute respiratory failure.  EXAM: PORTABLE CHEST - 1 VIEW  COMPARISON:  12/14/2014.  FINDINGS: 0548  hrs. Low volume film with leftward patient rotation. The cardio pericardial silhouette is enlarged. Endotracheal tube tip is approximately 3.1 cm above the base of the carina. Right PICC line tip overlies expected location of the SVC/RA junction. The NG tube passes into the stomach although the distal tip position is not included on the film. Bibasilar atelectasis noted. No overt pulmonary edema.  IMPRESSION: Mild basilar atelectasis on this rotated film. No substantial interval change.   Electronically Signed   By: Kennith Center M.D.   On: 12/15/2014 07:43   Image reviewed  ASSESSMENT / PLAN:  PULMONARY OETT 6/17 >>  A: Acute respiratory failure in setting of shock Probable OSA  P:   SBTs with high PS Defer extubation until abdomen closed  CARDIOVASCULAR CVL 6/17 PICC >>> A:  Septic shock  in setting of peritonitis HTN Bigeminy in OR, resolved P:  MAP goal >65   RENAL A:   AKI, improving  NAG metabolic acidosis  P:   1/2 NS @ 75 -can dc once TNA started Strict I&O   GASTROINTESTINAL A:   Ruptured appendix Peritonitis  Ischemic bowel Now s/p expl lap, appendectomy, SB resection, and placement of wound vac 6/17; bowel left in discontinuity  P:  Nutrition per surg. Anticipate post-op ileus Wound care per surg  For return to OR for abd closure 6/23 TNA per pharmacy  HEMATOLOGIC A:  Anemia  P:  Trend CBC [place] heparin  Transfuse for hgb < 7   INFECTIOUS Peritoneal fluid 6/17 >> strep, e coli  Blood cx 6/18 >>  ng A:   Sepsis Peritonitis in setting of ruptured appendix  Possible bacterial translocation d/t ischemic bowel  P:    invanz 6/15>>> vanco 6/18 >> 6/22  Consider empiric antifungals if decompensates in any way given perforation.   ENDOCRINE A:   Type II DM Hyperglycemia  P:   q4h ssi    NEUROLOGIC A:   Acute encephalopathy  Pain  P:   RASS goal: -2 while open abdomen PAD protocol -prop and fent gtt  WUA daily   FAMILY  - Updates:  no family at bedside  - Inter-disciplinary family meet or Palliative Care meeting due by: NA    TODAY'S SUMMARY:  Peritonitis from ruptured appendix and ischemic bowel.  Extubation only after abdomen closed, would need TNA unless surgeons expect bowel function to return soon  The patient is critically ill with multiple organ systems failure and requires high complexity decision making for assessment and support, frequent evaluation and titration of therapies, application of advanced monitoring technologies and extensive interpretation of multiple databases. Critical Care Time devoted to patient care services described in this note independent of APP time is 31 minutes.  Cyril Mourning MD. Tonny Bollman. [facility] Pager [phone] If no response call [phone]     12/15/2014, 8:57 AM

## 2014-12-15 NOTE — Progress Notes (Signed)
Nutrition Consult/Follow Up  DOCUMENTATION CODES:  Morbid obesity  INTERVENTION:   TPN per pharmacy  NUTRITION DIAGNOSIS:  Inadequate oral intake related to inability to eat as evidenced by NPO status, ongoing  GOAL:  Provide needs based on ASPEN/SCCM guidelines, currently unmet  MONITOR:  Vent status, Labs, Weight trends, Skin, I & O's (TPN prescription)  ASSESSMENT: 36 yof who was admitted 6/14 W/ 2 night h/o perforated appendicitis. She appeared to improve initially with abx and resuscitation Then on 6/17 she had more pain, emesis, no more bowel function and her abdomen was more tender. Her wbc was rising. She went for emergent exploration, found to have severe peritonitis as well as evidence of bowel ischemia. She underwent appendectomy, SB resection and was left in discontinuity. Returned to the ICU on vent. PCCM asked to assist w/ care.   Patient s/p procedures 6/17: APPENDECTOMY SMALL BOWEL RESECTION APPLICATION OF ABDOMINAL WOUND VAC  Patient is currently intubated on ventilator support MV: 13.6 L/min Temp (24hrs), Avg:99.8 F (37.7 C), Min:99.2 F (37.3 C), Max:100.6 F (38.1 C)   Propofol: 22.9 ml/hr -----> 604 fat kcals  RD consulted for new TPN.  Expected prolonged ileus/NPO status.  NGT to LIS.  Patient is receiving TPN with Clinimix E 5/15 @ 40 ml/hr.  Provides 682 kcal and 48 gm protein per day.  No IVFE at this time.  Meets 59% minimum estimated energy needs and 35% minimum estimated protein needs.  Height:  Ht Readings from Last 1 Encounters:  12/08/14 5\' 3"  (1.6 m)    Weight:  Wt Readings from Last 1 Encounters:  12/14/14 296 lb 4.8 oz (134.4 kg)    Ideal Body Weight:  52.3 kg  Wt Readings from Last 10 Encounters:  12/14/14 296 lb 4.8 oz (134.4 kg)  09/09/14 282 lb (127.914 kg)  06/10/14 283 lb 9.6 oz (128.64 kg)  03/23/14 279 lb (126.554 kg)  12/15/13 276 lb 3.2 oz (125.283 kg)  09/14/13 280 lb 6.4 oz (127.189 kg)  06/16/13 279 lb  3.2 oz (126.644 kg)  12/15/12 277 lb 9.6 oz (125.919 kg)  05/16/10 211 lb (95.709 kg)  05/09/10 205 lb (92.987 kg)    BMI:  Body mass index is 52.5 kg/(m^2).  Re-estimated Nutritional Needs:  Kcal:  1149-1300  Protein:  135-145 gm  Fluid:  per MD  Skin:  Wound (see comment) (closed mid abdominal incision (wound VAC))  Diet Order:  Diet NPO time specified .TPN (CLINIMIX-E) Adult  EDUCATION NEEDS:  No education needs identified at this time   Intake/Output Summary (Last 24 hours) at 12/15/14 1432 Last data filed at 12/15/14 1300  Gross per 24 hour  Intake 3025.07 ml  Output   2825 ml  Net 200.07 ml    Last BM: 6/16   Maureen Chatters, RD, LDN Pager #: [phone] After-Hours Pager #: [phone]

## 2014-12-15 NOTE — Progress Notes (Signed)
Patient ID: Allison [name], female   DOB: [birth year], 54 y.o.   MRN: [id number]     [facility]      [address] [place], [place] [address]    Phone: [phone] FAX: [phone]     Subjective: Good UOP.  Sedated on vent.  WBC down.    Objective:  Vital signs:  Filed Vitals:   12/15/14 0500 12/15/14 0600 12/15/14 0721 12/15/14 0726  BP: 100/30 109/43    Pulse: 78 76 81   Temp:    100.1 F (37.8 C)  TempSrc:    Axillary  Resp: 20 20 20    Height:      Weight:      SpO2: 98% 98% 98%     Last BM Date: 12/09/14  Intake/Output   Yesterday:  06/21 0701 - 06/22 0700 In: 3790.7 [I.V.:3140.7; IV Piggyback:650] Out: 2805 [Urine:1660; Emesis/NG output:500; Drains:625; Blood:20] This shift: I/O last 3 completed shifts: In: 5821.7 [I.V.:4681.7; NG/GT:90; IV Piggyback:1050] Out: 33 [Urine:2610; Emesis/NG output:1100; Drains:1375; Blood:20]     Physical Exam: General: on vent.  Chest:cta.  CV:  Pulses intact.  Regular rhythm Abdomen: no bowel sounds.  Midline wound-surrounding tissue is good.  VAC with serosanguinous output.  Ext:  SCDs BLE.  Generalized edema.    Problem List:   Principal Problem:   Perforated appendicitis Active Problems:   DM type 2, uncontrolled, with renal complications   CORONARY ATHEROSCLEROSIS, NATIVE VESSEL   Severe sepsis with acute organ dysfunction   Acute kidney failure   Hypokalemia   Morbid obesity   Acute respiratory failure with hypoxia    Results:   Labs: Results for orders placed or performed during the hospital encounter of 12/07/14 (from the past 48 hour(s))  Glucose, capillary     Status: Abnormal   Collection Time: 12/13/14  7:38 AM  Result Value Ref Range   Glucose-Capillary 168 (H) 65 - 99 mg/dL   Comment 1 Capillary Specimen    Comment 2 Notify RN   Glucose, capillary     Status: Abnormal   Collection Time: 12/13/14 11:33 AM  Result Value Ref Range   Glucose-Capillary 182 (H) 65 - 99 mg/dL   Comment 1 Capillary Specimen    Comment 2 Notify RN   Vancomycin, trough     Status: Abnormal   Collection Time: 12/13/14  1:00 PM  Result Value Ref Range   Vancomycin Tr 9 (L) 10.0 - 20.0 ug/mL  Glucose, capillary     Status: Abnormal   Collection Time: 12/13/14  3:17 PM  Result Value Ref Range   Glucose-Capillary 167 (H) 65 - 99 mg/dL   Comment 1 Capillary Specimen    Comment 2 Notify RN   Triglycerides     Status: Abnormal   Collection Time: 12/13/14  4:25 PM  Result Value Ref Range   Triglycerides 231 (H) <150 mg/dL  Glucose, capillary     Status: Abnormal   Collection Time: 12/13/14  7:59 PM  Result Value Ref Range   Glucose-Capillary 117 (H) 65 - 99 mg/dL   Comment 1 Notify RN    Comment 2 Document in Chart   Glucose, capillary     Status: Abnormal   Collection Time: 12/13/14 11:43 PM  Result Value Ref Range   Glucose-Capillary 147 (H) 65 - 99 mg/dL   Comment 1 Notify RN    Comment 2 Document in Chart   Glucose, capillary     Status: Abnormal  Collection Time: 12/14/14  3:37 AM  Result Value Ref Range   Glucose-Capillary 155 (H) 65 - 99 mg/dL   Comment 1 Notify RN    Comment 2 Document in Chart   CBC     Status: Abnormal   Collection Time: 12/14/14  4:05 AM  Result Value Ref Range   WBC 21.6 (H) 4.0 - 10.5 K/uL   RBC 2.77 (L) 3.87 - 5.11 MIL/uL   Hemoglobin 8.3 (L) 12.0 - 15.0 g/dL   HCT 25.9 (L) 36.0 - 46.0 %   MCV 93.5 78.0 - 100.0 fL   MCH 30.0 26.0 - 34.0 pg   MCHC 32.0 30.0 - 36.0 g/dL   RDW 15.2 11.5 - 15.5 %   Platelets 445 (H) 150 - 400 K/uL  Comprehensive metabolic panel     Status: Abnormal   Collection Time: 12/14/14  4:05 AM  Result Value Ref Range   Sodium 141 135 - 145 mmol/L   Potassium 3.8 3.5 - 5.1 mmol/L   Chloride 113 (H) 101 - 111 mmol/L   CO2 23 22 - 32 mmol/L   Glucose, Bld 166 (H) 65 - 99 mg/dL   BUN 18 6 - 20 mg/dL   Creatinine, Ser 0.62 0.44 - 1.00 mg/dL   Calcium 7.5 (L) 8.9 - 10.3  mg/dL   Total Protein 4.3 (L) 6.5 - 8.1 g/dL   Albumin 1.2 (L) 3.5 - 5.0 g/dL   AST 19 15 - 41 U/L   ALT 12 (L) 14 - 54 U/L   Alkaline Phosphatase 53 38 - 126 U/L   Total Bilirubin 0.7 0.3 - 1.2 mg/dL   GFR calc non Af Amer >60 >60 mL/min   GFR calc Af Amer >60 >60 mL/min    Comment: (NOTE) The eGFR has been calculated using the CKD EPI equation. This calculation has not been validated in all clinical situations. eGFR's persistently <60 mL/min signify possible Chronic Kidney Disease.    Anion gap 5 5 - 15  Glucose, capillary     Status: Abnormal   Collection Time: 12/14/14  7:56 AM  Result Value Ref Range   Glucose-Capillary 158 (H) 65 - 99 mg/dL   Comment 1 Notify RN   Glucose, capillary     Status: Abnormal   Collection Time: 12/14/14  3:34 PM  Result Value Ref Range   Glucose-Capillary 156 (H) 65 - 99 mg/dL   Comment 1 Notify RN   Glucose, capillary     Status: Abnormal   Collection Time: 12/14/14  7:13 PM  Result Value Ref Range   Glucose-Capillary 125 (H) 65 - 99 mg/dL   Comment 1 Capillary Specimen    Comment 2 Notify RN    Comment 3 Document in Chart   Glucose, capillary     Status: Abnormal   Collection Time: 12/14/14 11:39 PM  Result Value Ref Range   Glucose-Capillary 138 (H) 65 - 99 mg/dL   Comment 1 Capillary Specimen    Comment 2 Notify RN    Comment 3 Document in Chart   Glucose, capillary     Status: Abnormal   Collection Time: 12/15/14  3:31 AM  Result Value Ref Range   Glucose-Capillary 138 (H) 65 - 99 mg/dL   Comment 1 Capillary Specimen    Comment 2 Notify RN    Comment 3 Document in Chart   Basic metabolic panel     Status: Abnormal   Collection Time: 12/15/14  4:30 AM  Result Value Ref Range  Sodium 140 135 - 145 mmol/L   Potassium 4.1 3.5 - 5.1 mmol/L   Chloride 109 101 - 111 mmol/L   CO2 23 22 - 32 mmol/L   Glucose, Bld 147 (H) 65 - 99 mg/dL   BUN 11 6 - 20 mg/dL   Creatinine, Ser 0.63 0.44 - 1.00 mg/dL   Calcium 7.5 (L) 8.9 - 10.3  mg/dL   GFR calc non Af Amer >60 >60 mL/min   GFR calc Af Amer >60 >60 mL/min    Comment: (NOTE) The eGFR has been calculated using the CKD EPI equation. This calculation has not been validated in all clinical situations. eGFR's persistently <60 mL/min signify possible Chronic Kidney Disease.    Anion gap 8 5 - 15  CBC     Status: Abnormal   Collection Time: 12/15/14  4:30 AM  Result Value Ref Range   WBC 17.6 (H) 4.0 - 10.5 K/uL   RBC 2.71 (L) 3.87 - 5.11 MIL/uL   Hemoglobin 8.1 (L) 12.0 - 15.0 g/dL   HCT 25.3 (L) 36.0 - 46.0 %   MCV 93.4 78.0 - 100.0 fL   MCH 29.9 26.0 - 34.0 pg   MCHC 32.0 30.0 - 36.0 g/dL   RDW 15.0 11.5 - 15.5 %   Platelets 481 (H) 150 - 400 K/uL    Imaging / Studies: Dg Chest Port 1 View  12/14/2014   CLINICAL DATA:  54 year old female with ventilator dependent respiratory failure. Initial encounter.  EXAM: PORTABLE CHEST - 1 VIEW  COMPARISON:  11/2014 and earlier.  FINDINGS: Portable AP semi upright view at 0545 hrs. Stable endotracheal tube, visible enteric tube, left IJ central line, and right side PICC line.  Mildly improved lung volumes and improved bibasilar ventilation with decreased patchy pulmonary opacity. No pneumothorax, pulmonary edema or pleural effusion. Stable cardiac size and mediastinal contours.  IMPRESSION: 1.  Stable lines and tubes. 2. Improved bibasilar ventilation with regressed bilateral pulmonary opacity. No new cardiopulmonary abnormality.   Electronically Signed   By: Genevie Ann M.D.   On: 12/14/2014 07:34    Medications / Allergies:  Scheduled Meds: . antiseptic oral rinse  7 mL Mouth Rinse QID  . chlorhexidine  15 mL Mouth Rinse BID  . ertapenem  1 g Intravenous Q24H  . fentaNYL (SUBLIMAZE) injection  50 mcg Intravenous Once  . heparin subcutaneous  5,000 Units Subcutaneous 3 times per day  . insulin aspart  0-15 Units Subcutaneous 6 times per day  . pantoprazole (PROTONIX) IV  40 mg Intravenous Daily  . sodium chloride  10-40 mL  Intracatheter Q12H  . vancomycin  1,000 mg Intravenous Q8H   Continuous Infusions: . sodium chloride 75 mL (12/14/14 0830)  . sodium chloride Stopped (12/14/14 0800)  . fentaNYL infusion INTRAVENOUS 100 mcg/hr (12/15/14 0400)  . propofol (DIPRIVAN) infusion 40 mcg/kg/min (12/15/14 0530)   PRN Meds:.acetaminophen (TYLENOL) oral liquid 160 mg/5 mL, [DISCONTINUED] acetaminophen **OR** acetaminophen, fentaNYL, sodium chloride  Antibiotics: Anti-infectives    Start     Dose/Rate Route Frequency Ordered Stop   12/13/14 2200  vancomycin (VANCOCIN) IVPB 1000 mg/200 mL premix     1,000 mg 200 mL/hr over 60 Minutes Intravenous Every 8 hours 12/13/14 1429     12/11/14 1400  vancomycin (VANCOCIN) IVPB 1000 mg/200 mL premix  Status:  Discontinued     1,000 mg 200 mL/hr over 60 Minutes Intravenous Every 12 hours 12/11/14 1300 12/13/14 1429   12/10/14 1700  vancomycin (VANCOCIN) 1,500 mg in sodium  chloride 0.9 % 500 mL IVPB  Status:  Discontinued     1,500 mg 250 mL/hr over 120 Minutes Intravenous Every 24 hours 12/10/14 1601 12/11/14 1300   12/09/14 1100  ertapenem (INVANZ) 1 g in sodium chloride 0.9 % 50 mL IVPB  Status:  Discontinued    Comments:  She has hives from pcn before, the cross reactivity is low and I think there is some benefit to hopefully avoiding surgery in this patient without other good choices for abx, I think cipro/flagyl has too much resistance and she has perforation   1 g 100 mL/hr over 30 Minutes Intravenous Every 24 hours 12/09/14 0940 12/09/14 1017   12/09/14 1100  ertapenem (INVANZ) 1 g in sodium chloride 0.9 % 50 mL IVPB  Status:  Discontinued    Comments:  She has hives from pcn before, the cross reactivity is low and I think there is some benefit to hopefully avoiding surgery in this patient without other good choices for abx, I think cipro/flagyl has too much resistance and she has perforation   1 g 100 mL/hr over 30 Minutes Intravenous Every 24 hours 12/09/14 1017  12/09/14 1018   12/09/14 1100  ertapenem (INVANZ) 1 g in sodium chloride 0.9 % 50 mL IVPB    Comments:  She has hives from pcn before, the cross reactivity is low and I think there is some benefit to hopefully avoiding surgery in this patient without other good choices for abx, I think cipro/flagyl has too much resistance and she has perforation   1 g 100 mL/hr over 30 Minutes Intravenous Every 24 hours 12/09/14 1020     12/09/14 0945  ertapenem (INVANZ) injection 1 g  Status:  Discontinued    Comments:  She has hives from pcn before, the cross reactivity is low and I think there is some benefit to hopefully avoiding surgery in this patient without other good choices for abx, I think cipro/flagyl has too much resistance and she has perforation   1 g Intramuscular Every 24 hours 12/09/14 0937 12/09/14 0941   12/08/14 0800  ciprofloxacin (CIPRO) IVPB 400 mg  Status:  Discontinued     400 mg 200 mL/hr over 60 Minutes Intravenous Every 12 hours 12/08/14 0652 12/09/14 0933   12/08/14 0800  metroNIDAZOLE (FLAGYL) IVPB 500 mg  Status:  Discontinued     500 mg 100 mL/hr over 60 Minutes Intravenous Every 8 hours 12/08/14 0652 12/09/14 0933   12/08/14 0330  piperacillin-tazobactam (ZOSYN) IVPB 3.375 g     3.375 g 100 mL/hr over 30 Minutes Intravenous  Once 12/08/14 0328 12/08/14 0513        Assessment/Plan Perforated appendicitis POD#5 Appendectomy, SBR, open abdomen---Dr. Donne Hazel VAC change POD#3, VAC change and partial wound closure POD#1 Back to OR tomorrow for VAC change and wound closure VTE prophylaxis-SCD/heparin ID-Invanz D#6 for perforated appendix/sepsis, Vanc 6/18--->  Cx with e coli and microaerophilic strep AKI-improved  VDRF-appreciate CCM management.  Hope to close abdomen tomorrow and then work towards extubation.  DM II-CBGs/SSI FEN-start TPN for open abdomen and expected prolonged ileus.  Dispo-to OR tomorrow   Erby Pian, [facility] Pager  [phone]) For consults and floor pages call [phone])  12/15/2014 7:43 AM

## 2014-12-16 ENCOUNTER — Inpatient Hospital Stay (HOSPITAL_COMMUNITY): Payer: BC Managed Care – PPO

## 2014-12-16 ENCOUNTER — Inpatient Hospital Stay (HOSPITAL_COMMUNITY): Payer: BC Managed Care – PPO | Admitting: Certified Registered Nurse Anesthetist

## 2014-12-16 ENCOUNTER — Encounter (HOSPITAL_COMMUNITY): Admission: EM | Disposition: A | Discharge status: Skilled Nursing Facility | Payer: Self-pay | Source: Home / Self Care

## 2014-12-16 DIAGNOSIS — L8990 Pressure ulcer of unspecified site, unspecified stage: Secondary | ICD-10-CM | POA: Insufficient documentation

## 2014-12-16 HISTORY — PX: VACUUM ASSISTED CLOSURE CHANGE: SHX5227

## 2014-12-16 LAB — COMPREHENSIVE METABOLIC PANEL
ALT: 17 U/L (ref 14–54)
AST: 30 U/L (ref 15–41)
Albumin: 1.3 g/dL — ABNORMAL LOW (ref 3.5–5.0)
Alkaline Phosphatase: 66 U/L (ref 38–126)
Anion gap: 7 (ref 5–15)
BILIRUBIN TOTAL: 0.6 mg/dL (ref 0.3–1.2)
BUN: 7 mg/dL (ref 6–20)
CALCIUM: 7.9 mg/dL — AB (ref 8.9–10.3)
CHLORIDE: 108 mmol/L (ref 101–111)
CO2: 27 mmol/L (ref 22–32)
Creatinine, Ser: 0.48 mg/dL (ref 0.44–1.00)
GLUCOSE: 190 mg/dL — AB (ref 65–99)
Potassium: 3.6 mmol/L (ref 3.5–5.1)
SODIUM: 142 mmol/L (ref 135–145)
Total Protein: 4.4 g/dL — ABNORMAL LOW (ref 6.5–8.1)

## 2014-12-16 LAB — CBC
HEMATOCRIT: 24.9 % — AB (ref 36.0–46.0)
HEMOGLOBIN: 7.8 g/dL — AB (ref 12.0–15.0)
MCH: 29.1 pg (ref 26.0–34.0)
MCHC: 31.3 g/dL (ref 30.0–36.0)
MCV: 92.9 fL (ref 78.0–100.0)
Platelets: 487 10*3/uL — ABNORMAL HIGH (ref 150–400)
RBC: 2.68 MIL/uL — ABNORMAL LOW (ref 3.87–5.11)
RDW: 14.6 % (ref 11.5–15.5)
WBC: 13.1 10*3/uL — ABNORMAL HIGH (ref 4.0–10.5)

## 2014-12-16 LAB — CULTURE, BLOOD (ROUTINE X 2)
CULTURE: NO GROWTH
Culture: NO GROWTH

## 2014-12-16 LAB — GLUCOSE, CAPILLARY
Glucose-Capillary: 154 mg/dL — ABNORMAL HIGH (ref 65–99)
Glucose-Capillary: 167 mg/dL — ABNORMAL HIGH (ref 65–99)
Glucose-Capillary: 168 mg/dL — ABNORMAL HIGH (ref 65–99)
Glucose-Capillary: 169 mg/dL — ABNORMAL HIGH (ref 65–99)
Glucose-Capillary: 176 mg/dL — ABNORMAL HIGH (ref 65–99)
Glucose-Capillary: 193 mg/dL — ABNORMAL HIGH (ref 65–99)

## 2014-12-16 LAB — ABO/RH: ABO/RH(D): A POS

## 2014-12-16 LAB — MAGNESIUM: MAGNESIUM: 1.6 mg/dL — AB (ref 1.7–2.4)

## 2014-12-16 LAB — ANAEROBIC CULTURE

## 2014-12-16 LAB — TRIGLYCERIDES: TRIGLYCERIDES: 992 mg/dL — AB (ref <150)

## 2014-12-16 LAB — PHOSPHORUS: Phosphorus: 3.4 mg/dL (ref 2.5–4.6)

## 2014-12-16 LAB — PREPARE RBC (CROSSMATCH)

## 2014-12-16 LAB — PREALBUMIN: Prealbumin: 9.2 mg/dL — ABNORMAL LOW (ref 18–38)

## 2014-12-16 SURGERY — REPLACEMENT, WOUND VAC DRESSING, ABDOMEN
Anesthesia: General | Site: Abdomen

## 2014-12-16 MED ORDER — 0.9 % SODIUM CHLORIDE (POUR BTL) OPTIME
TOPICAL | Status: DC | PRN
  Administered 2014-12-16: 1000 mL

## 2014-12-16 MED ORDER — SODIUM CHLORIDE 0.9 % IV SOLN
2.0000 g | Freq: Once | INTRAVENOUS | Status: AC
  Administered 2014-12-16: 2 g via INTRAVENOUS
  Filled 2014-12-16: qty 4

## 2014-12-16 MED ORDER — PROPOFOL 10 MG/ML IV BOLUS
INTRAVENOUS | Status: DC | PRN
  Administered 2014-12-16: 40 mg via INTRAVENOUS

## 2014-12-16 MED ORDER — HEPARIN SODIUM (PORCINE) 5000 UNIT/ML IJ SOLN
5000.0000 [IU] | Freq: Three times a day (TID) | INTRAMUSCULAR | Status: DC
  Administered 2014-12-16 – 2014-12-25 (×26): 5000 [IU] via SUBCUTANEOUS
  Filled 2014-12-16 (×28): qty 1

## 2014-12-16 MED ORDER — MAGNESIUM SULFATE 2 GM/50ML IV SOLN
2.0000 g | Freq: Once | INTRAVENOUS | Status: DC
  Filled 2014-12-16: qty 50

## 2014-12-16 MED ORDER — FENTANYL CITRATE (PF) 100 MCG/2ML IJ SOLN
INTRAMUSCULAR | Status: DC | PRN
  Administered 2014-12-16: 100 ug via INTRAVENOUS

## 2014-12-16 MED ORDER — LACTATED RINGERS IV SOLN
INTRAVENOUS | Status: DC | PRN
  Administered 2014-12-16: 12:00:00 via INTRAVENOUS

## 2014-12-16 MED ORDER — ROCURONIUM BROMIDE 100 MG/10ML IV SOLN
INTRAVENOUS | Status: DC | PRN
  Administered 2014-12-16: 50 mg via INTRAVENOUS

## 2014-12-16 MED ORDER — VECURONIUM BROMIDE 10 MG IV SOLR
INTRAVENOUS | Status: DC | PRN
  Administered 2014-12-16: 5 mg via INTRAVENOUS

## 2014-12-16 MED ORDER — TRACE MINERALS CR-CU-F-FE-I-MN-MO-SE-ZN IV SOLN
INTRAVENOUS | Status: AC
  Administered 2014-12-16: 18:00:00 via INTRAVENOUS
  Filled 2014-12-16: qty 960

## 2014-12-16 MED ORDER — POTASSIUM CHLORIDE 10 MEQ/50ML IV SOLN
10.0000 meq | INTRAVENOUS | Status: AC
  Administered 2014-12-16 (×3): 10 meq via INTRAVENOUS
  Filled 2014-12-16 (×3): qty 50

## 2014-12-16 SURGICAL SUPPLY — 29 items
CANISTER SUCTION 2500CC (MISCELLANEOUS) ×3 IMPLANT
CANISTER WOUND CARE 500ML ATS (WOUND CARE) ×3 IMPLANT
COVER SURGICAL LIGHT HANDLE (MISCELLANEOUS) ×3 IMPLANT
DRAPE LAPAROSCOPIC ABDOMINAL (DRAPES) ×3 IMPLANT
DRAPE UTILITY XL STRL (DRAPES) ×4 IMPLANT
DRSG ADAPTIC 3X8 NADH LF (GAUZE/BANDAGES/DRESSINGS) ×4 IMPLANT
DRSG VAC ATS MED SENSATRAC (GAUZE/BANDAGES/DRESSINGS) ×2 IMPLANT
ELECT CAUTERY BLADE 6.4 (BLADE) ×2 IMPLANT
ELECT REM PT RETURN 9FT ADLT (ELECTROSURGICAL) ×3
ELECTRODE REM PT RTRN 9FT ADLT (ELECTROSURGICAL) ×1 IMPLANT
GLOVE BIOGEL PI IND STRL 7.0 (GLOVE) IMPLANT
GLOVE BIOGEL PI IND STRL 7.5 (GLOVE) IMPLANT
GLOVE BIOGEL PI INDICATOR 7.0 (GLOVE) ×4
GLOVE BIOGEL PI INDICATOR 7.5 (GLOVE) ×2
GLOVE SURG SS PI 6.5 STRL IVOR (GLOVE) ×2 IMPLANT
GLOVE SURG SS PI 7.0 STRL IVOR (GLOVE) ×4 IMPLANT
GLOVE SURG SS PI 7.5 STRL IVOR (GLOVE) ×4 IMPLANT
GOWN STRL REUS W/ TWL LRG LVL3 (GOWN DISPOSABLE) ×2 IMPLANT
GOWN STRL REUS W/TWL LRG LVL3 (GOWN DISPOSABLE) ×6
KIT BASIN OR (CUSTOM PROCEDURE TRAY) ×3 IMPLANT
KIT ROOM TURNOVER OR (KITS) ×3 IMPLANT
NS IRRIG 1000ML POUR BTL (IV SOLUTION) ×4 IMPLANT
PACK GENERAL/GYN (CUSTOM PROCEDURE TRAY) ×3 IMPLANT
PAD ARMBOARD 7.5X6 YLW CONV (MISCELLANEOUS) ×6 IMPLANT
SUCTION POOLE TIP (SUCTIONS) ×2 IMPLANT
SUT NOVA 1 T20/GS 25DT (SUTURE) ×4 IMPLANT
TOWEL OR 17X24 6PK STRL BLUE (TOWEL DISPOSABLE) ×3 IMPLANT
TOWEL OR 17X26 10 PK STRL BLUE (TOWEL DISPOSABLE) ×1 IMPLANT
WATER STERILE IRR 1000ML POUR (IV SOLUTION) ×1 IMPLANT

## 2014-12-16 NOTE — Anesthesia Procedure Notes (Signed)
Date/Time: 12/16/2014 12:20 PM Performed by: Margaree Mackintosh Pre-anesthesia Checklist: Patient identified, Emergency Drugs available, Suction available, Patient being monitored and Timeout performed Patient Re-evaluated:Patient Re-evaluated prior to inductionOxygen Delivery Method: Circle system utilized Intubation Type: Inhalational induction with existing ETT

## 2014-12-16 NOTE — Anesthesia Postprocedure Evaluation (Signed)
Anesthesia Post Note  Patient: Allison [name]  Procedure(s) Performed: Procedure(s) (LRB): VACUUM CHANGE  AND WOUND CLOSURE  (N/A)  Anesthesia type: General  Patient location: ICU  Post pain: Pain level controlled  Post assessment: Post-op Vital signs reviewed  Last Vitals:  Filed Vitals:   12/16/14 1400  BP: 99/80  Pulse: 89  Temp:   Resp: 24    Post vital signs: stable  Level of consciousness: Patient remains intubated per anesthesia plan  Complications: No apparent anesthesia complications

## 2014-12-16 NOTE — Anesthesia Preprocedure Evaluation (Addendum)
Anesthesia Evaluation  Patient identified by MRN, date of birth, ID band Patient unresponsive  General Assessment Comment:Intubated. CE  Reviewed: Allergy & Precautions, NPO status , Patient's Chart, lab work & pertinent test results  Airway Mallampati: Intubated       Dental   Pulmonary former smoker,  Intubated from ICU.   + decreased breath sounds      Cardiovascular + CAD and + Past MI Rhythm:Regular Rate:Normal     Neuro/Psych  Headaches, PSYCHIATRIC DISORDERS Depression Bipolar Disorder    GI/Hepatic Neg liver ROS,   Endo/Other  diabetes  Renal/GU Renal disease     Musculoskeletal   Abdominal   Peds  Hematology   Anesthesia Other Findings   Reproductive/Obstetrics                            Anesthesia Physical  Anesthesia Plan  ASA: IV  Anesthesia Plan: General   Post-op Pain Management:    Induction: Intravenous  Airway Management Planned: Oral ETT  Additional Equipment:   Intra-op Plan:   Post-operative Plan: Post-operative intubation/ventilation  Informed Consent: I have reviewed the patients History and Physical, chart, labs and discussed the procedure including the risks, benefits and alternatives for the proposed anesthesia with the patient or authorized representative who has indicated his/her understanding and acceptance.     Plan Discussed with: CRNA, Anesthesiologist and Surgeon  Anesthesia Plan Comments:         Anesthesia Quick Evaluation

## 2014-12-16 NOTE — Op Note (Signed)
12/07/2014 - 12/16/2014  1:08 PM  PATIENT:  Allison [name]  54 y.o. female  PRE-OPERATIVE DIAGNOSIS:  open abdomen  POST-OPERATIVE DIAGNOSIS:  open abdomen  PROCEDURE:  Procedure(s): VACUUM CHANGE  AND WOUND CLOSURE  (N/A)  SURGEON:  Surgeon(s) and Role:    * Griselda Miner, MD - Primary  PHYSICIAN ASSISTANT:   ASSISTANTS: Jorje Guild, PA   ANESTHESIA:   general  EBL:  Total I/O In: 558 [I.V.:308; IV Piggyback:50; TPN:200] Out: 400 [Urine:275; Drains:125]  BLOOD ADMINISTERED:none  DRAINS: none   LOCAL MEDICATIONS USED:  NONE  SPECIMEN:  No Specimen  DISPOSITION OF SPECIMEN:  N/A  COUNTS:  YES  TOURNIQUET:  * No tourniquets in log *  DICTATION: .Dragon Dictation  After informed consent was obtained the patient was brought to the operating room and placed in the supine position on the operating room table. After adequate induction of general anesthesia the patient's abdominal vacuum dressing was removed. Her abdomen was prepped with Betadine and draped in usual sterile manner. There was more laxity to the abdominal wall and we were able to finish closing her fascia with interrupted #1 Novafil stitches. A piece of Adaptic was placed at the base of the open wound and then a medium black sponge and vacuum dressing were reapplied. She tolerated the procedure well. At the end of the case all needle sponge and instrument counts were correct. The patient was then taken back to the ICU for further management.  PLAN OF CARE: Admit to inpatient   PATIENT DISPOSITION:  ICU - intubated and hemodynamically stable.   Delay start of Pharmacological VTE agent (>24hrs) due to surgical blood loss or risk of bleeding: no

## 2014-12-16 NOTE — Progress Notes (Signed)
PULMONARY / CRITICAL CARE MEDICINE   Name: Allison [name] MRN: [id number] DOB: 02/28/1961    ADMISSION DATE:  12/07/2014 CONSULTATION DATE:  6/17  REFERRING MD :  Dwain Sarna   CHIEF COMPLAINT:   Severe sepsis Assist w/ vent management   INITIAL PRESENTATION:   89 yof who was admitted 6/14  W/ 2 night h/o perforated appendicitis. She appeared to improve initially with abx and resuscitation Then on 6/17 she had more pain, emesis, no more bowel function and her abdomen was more tender. Her wbc was rising. She went for emergent exploration, found to have severe peritonitis as well as evidence of bowel ischemia. She underwent appendectomy, SB resection and was left in discontinuity. Returned to the ICU on vent. PCCM asked to assist w/ care.   STUDIES:  CT abd/pelvis 6/15: Inflammatory process demonstrated throughout the pelvis with fluid and infiltration in the low pelvis and mesenteric. At appendicolith with abnormal appearing appendix, likely this represents perforated appendicitis. No discrete abscess is identified. There is small bowel wall thickening of the pelvic loops with proximal small bowel obstruction.  SIGNIFICANT EVENTS: Exp lap 6/17 6/19 reanastomosis, open abd vac 6/21 partial abd closure -vac  INTERVAL EVENTS / SUBJECTIVE:  aFebrile sedated on propofol & fent gtt,agitated on WUA Good UO   VITAL SIGNS: Temp:  [98.3 F (36.8 C)-99.9 F (37.7 C)] 98.3 F (36.8 C) (06/23 0737) Pulse Rate:  [64-101] 68 (06/23 0700) Resp:  [18-26] 20 (06/23 0700) BP: (95-139)/(40-74) 112/58 mmHg (06/23 0700) SpO2:  [97 %-100 %] 100 % (06/23 0700) FiO2 (%):  [40 %] 40 % (06/23 0600) HEMODYNAMICS:   VENTILATOR SETTINGS: Vent Mode:  [-] PRVC FiO2 (%):  [40 %] 40 % Set Rate:  [20 bmp] 20 bmp Vt Set:  [500 mL] 500 mL PEEP:  [5 cmH20] 5 cmH20 Pressure Support:  [15 cmH20] 15 cmH20 Plateau Pressure:  [17 cmH20-24 cmH20] 22 cmH20 INTAKE / OUTPUT:  Intake/Output Summary (Last 24  hours) at 12/16/14 0805 Last data filed at 12/16/14 0700  Gross per 24 hour  Intake 2618.29 ml  Output   2655 ml  Net -36.71 ml    PHYSICAL EXAMINATION: General:  Acutely ill  Neuro:  Sedated on vent , RASS -2 HEENT:  Orally intubated. Neck large, bite block out Cardiovascular:  Rrr, no M, 80's Lungs:  Decreased bases  Abdomen:  Obese, vac dressing intact  Musculoskeletal:  Intact  Skin:  Intact   LABS:  CBC  Recent Labs Lab 12/14/14 0405 12/15/14 0430 12/16/14 0430  WBC 21.6* 17.6* 13.1*  HGB 8.3* 8.1* 7.8*  HCT 25.9* 25.3* 24.9*  PLT 445* 481* 487*   Coag's  Recent Labs Lab 12/10/14 2028  APTT 44*  INR 1.43   BMET  Recent Labs Lab 12/14/14 0405 12/15/14 0430 12/16/14 0430  NA 141 140 142  K 3.8 4.1 3.6  CL 113* 109 108  CO2 23 23 27   BUN 18 11 7   CREATININE 0.62 0.63 0.48  GLUCOSE 166* 147* 190*   Electrolytes  Recent Labs Lab 12/12/14 0400 12/13/14 0330 12/14/14 0405 12/15/14 0430 12/16/14 0430  CALCIUM 7.5* 7.6* 7.5* 7.5* 7.9*  MG 2.3 2.1  --   --  1.6*  PHOS 2.4* 4.2  --   --  3.4   Sepsis Markers  Recent Labs Lab 12/10/14 1700 12/12/14 0400  LATICACIDVEN 1.0 0.7   ABG  Recent Labs Lab 12/10/14 1812 12/10/14 2033 12/11/14 0401  PHART 7.273* 7.310* 7.295*  PCO2ART 35.6 34.4*  35.9  PO2ART 85.0 89.0 83.0   Liver Enzymes  Recent Labs Lab 12/14/14 0405 12/16/14 0430  AST 19 30  ALT 12* 17  ALKPHOS 53 66  BILITOT 0.7 0.6  ALBUMIN 1.2* 1.3*   Cardiac Enzymes No results for input(s): TROPONINI, PROBNP in the last 168 hours. Glucose  Recent Labs Lab 12/15/14 0724 12/15/14 1108 12/15/14 1633 12/15/14 1926 12/15/14 2348 12/16/14 0356  GLUCAP 140* 145* 129* 129* 132* 169*    Imaging Dg Chest Port 1 View  12/16/2014   CLINICAL DATA:  Acute respiratory failure  EXAM: PORTABLE CHEST - 1 VIEW  COMPARISON:  12/15/2014  FINDINGS: Endotracheal tube in good position. NG tube in the stomach. Right arm PICC tip  difficult to visualize due to overlying EKG wires. Catheter tip appears to be at the cavoatrial junction and unchanged  Cardiac enlargement with vascular congestion. Improvement in right lower lobe atelectasis. No edema or effusion.  IMPRESSION: Slightly improved aeration in the lung bases with decrease in right lower lobe atelectasis. Support lines remain in good position.   Electronically Signed   By: Marlan Palau M.D.   On: 12/16/2014 07:44   Image reviewed  ASSESSMENT / PLAN:  PULMONARY OETT 6/17 >>  A: Acute respiratory failure in setting of shock Probable OSA  P:   SBTs with high PS Defer extubation until abdomen closed  CARDIOVASCULAR CVL 6/17 PICC >>> A:  Septic shock  in setting of peritonitis HTN Bigeminy in OR, resolved P:  MAP goal >65   RENAL A:   AKI, improving  NAG metabolic acidosis  P:   Replete lytes asneeded Strict I&O   GASTROINTESTINAL A:   Ruptured appendix Peritonitis  Ischemic bowel Now s/p expl lap, appendectomy, SB resection, and placement of wound vac 6/17; bowel left in discontinuity  Protein calorie malnutrition P:  Nutrition per surg. Anticipate post-op ileus Wound care per surg  For abd closure 6/23 TNA per pharmacy  HEMATOLOGIC A:  Anemia  P:  Trend CBC [place] heparin  Transfuse for hgb < 7   INFECTIOUS Peritoneal fluid 6/17 >> strep, e coli  Blood cx 6/18 >>  ng A:   Sepsis Peritonitis in setting of ruptured appendix  Possible bacterial translocation d/t ischemic bowel  P:    invanz 6/15>>> vanco 6/18 >> 6/22  Consider empiric antifungals only  if decompensates (given perforation.)   ENDOCRINE A:   Type II DM Hyperglycemia  P:   q4h ssi    NEUROLOGIC A:   Acute encephalopathy  Pain  P:   RASS goal: -2 while open abdomen PAD protocol -prop and fent gtt  WUA daily   FAMILY  - Updates: no family at bedside  - Inter-disciplinary family meet or Palliative Care meeting due by: NA    TODAY'S  SUMMARY:  Peritonitis from ruptured appendix and ischemic bowel.  Extubation only after abdomen closed, on TNA   The patient is critically ill with multiple organ systems failure and requires high complexity decision making for assessment and support, frequent evaluation and titration of therapies, application of advanced monitoring technologies and extensive interpretation of multiple databases. Critical Care Time devoted to patient care services described in this note independent of APP time is 31 minutes.   Cyril Mourning MD. Tonny Bollman. [facility] Pager [phone] If no response call [phone]     12/16/2014, 8:05 AM

## 2014-12-16 NOTE — Transfer of Care (Signed)
Immediate Anesthesia Transfer of Care Note  Patient: Allison [name]  Procedure(s) Performed: Procedure(s): VACUUM CHANGE  AND WOUND CLOSURE  (N/A)  Patient Location: ICU  Anesthesia Type:General  Level of Consciousness: sedated  Airway & Oxygen Therapy: Patient remains intubated per anesthesia plan and Patient placed on Ventilator (see vital sign flow sheet for setting)  Post-op Assessment: Report given to RN and Post -op Vital signs reviewed and stable  Post vital signs: Reviewed and stable  Last Vitals:  Filed Vitals:   12/16/14 1200  BP:   Pulse: 73  Temp:   Resp: 0    Complications: No apparent anesthesia complications

## 2014-12-16 NOTE — Procedures (Signed)
Pt not in the room at this time at 1215, to assess patient and vent check. Pt is in OR.

## 2014-12-16 NOTE — Progress Notes (Signed)
[place] NOTE  Pharmacy Consult for TPN Indication: Prolonged ileus, NPO x ~7d  Allergies  Allergen Reactions  . Sulfonamide Derivatives Anaphylaxis  . Aspirin Nausea And Vomiting  . Penicillins Hives  . Latex Rash    Patient Measurements: Height: 5' 3"  (160 cm) Weight: 296 lb 4.8 oz (134.4 kg) IBW/kg (Calculated) : 52.4 Adjusted Body Weight: 72.9kg  Vital Signs: Temp: 98.8 F (37.1 C) (06/23 0404) Temp Source: Oral (06/23 0404) BP: 112/58 mmHg (06/23 0700) Pulse Rate: 68 (06/23 0700) Intake/Output from previous day: 06/22 0701 - 06/23 0700 In: 2728.8 [I.V.:2150.1; IV Piggyback:50; TPN:528.7] Out: 2880 [Urine:1580; Emesis/NG output:575; Drains:725] Intake/Output from this shift:    Labs:  Recent Labs  12/14/14 0405 12/15/14 0430 12/16/14 0430  WBC 21.6* 17.6* 13.1*  HGB 8.3* 8.1* 7.8*  HCT 25.9* 25.3* 24.9*  PLT 445* 481* 487*     Recent Labs  12/13/14 1625 12/14/14 0405 12/15/14 0430 12/16/14 0430  NA  --  141 140 142  K  --  3.8 4.1 3.6  CL  --  113* 109 108  CO2  --  23 23 27   GLUCOSE  --  166* 147* 190*  BUN  --  18 11 7   CREATININE  --  0.62 0.63 0.48  CALCIUM  --  7.5* 7.5* 7.9*  MG  --   --   --  1.6*  PHOS  --   --   --  3.4  PROT  --  4.3*  --  4.4*  ALBUMIN  --  1.2*  --  1.3*  AST  --  19  --  30  ALT  --  12*  --  17  ALKPHOS  --  53  --  66  BILITOT  --  0.7  --  0.6  PREALBUMIN  --   --   --  9.2*  TRIG 231*  --   --   --    Estimated Creatinine Clearance: 109.4 mL/min (by C-G formula based on Cr of 0.48).    Recent Labs  12/15/14 1926 12/15/14 2348 12/16/14 0356  GLUCAP 129* 132* 169*    Medical History: Past Medical History  Diagnosis Date  . Non-ST elevated myocardial infarction (non-STEMI)   . Dysphasia   . Hypotension   . Hypokalemia   . Non-insulin dependent diabetes mellitus   . Hyperlipidemia   . Migraine headache   . Hemorrhoids   . History of MRSA infection   . Major depressive  disorder   . Bipolar disorder     Insulin Requirements in the past 24 hours:  13 units SSI q4h in past 24h  Assessment: 77 yof admitted 12/08/14 with 2 night hx of perforated appendix. Pt placed NPO and originally tried to medically manage. Appeared to improve initially with abx but had more pain, emesis, no more bowel function, abdomen more tender, wbc increasing. Went for emergent exploration and found to have severe peritonitis + bowel ischemia, underwent appendectomy and SBR. Transferred to ICU. Nutrition recommends TPN initiation if unable to extubate and initiate feeding within 7-10 days of intubation. Pharmacy consulted to initiate TPN on 12/15/14 due to anticipated prolonged ileus as bowel left in discontinuity. Pt NPO x ~7days inpatient  Surgeries/Procedures: 6/17:  Emergency exploration - appendectomy, SBR, abdominal wound vac 6/19: Ex-lap, SB anastomosis, possible abdomen closure (failed) 6/21: Abdominal vacuum change, partial abdomen closure 6/23: possible abdomen closure  GI: Prealbumin 9.2. Ruptured appendix, peritonitis, ischemic bowel s/p appendectomy/SBR/abdominal wound vac. Abdominal  wall could not be closed with edema/dilation of SB. OGT in place. 651m/24h emesis,7541m24 drain output. No bowel function yet - LBM 12/09/14. Anticipate post-op ileus as bowel left in discontinuity. To return to OR today for possible abdomen closure. PMH of dysphagia. PPI  Endo: DM - CBGS 129-190 since TPN started (125-166 prior to TPN start)  Lytes: K down to 3.6 (goal >/=4 with ileus), Mg down to 1.6 (goal >/=2 with ileus), phos 4.2>>3.4, coCa ok 10.1, others wnl  Renal: AKI on admit secondary to dehydration. SCr stable 0.48, normalized CrCl~74. UOP ok 0.5 ml/kg/h. 1/2NS@35   Pulm: VDRF (fiO2 40) - hopeful to close abdomen today and move towards extubation  Cards: BP soft, HR wnl. HLD, CAD. plavix  Hepatobil: LFTs/tbili/alk phos ok, TG 161>>231 (on propofol)  Neuro: Fent gtt@100 , propofol  gtt@40   ID: Ertapenem D#7 for septic shock/peritonitis. WBC improved 13.1, Tmax/24 100.6, currently afebrile. May consider antifungals. Pen allergy - hives  6/15 Ertapenem>> 6/18 Vanc>> 6/22  6/17 peritoneal fluid>> microaerophilic strep, Ecoli (no S) 6/18 BCx2>> ngtd  Best Practices: scds, heparin [place] TPN Access: PICC placed 6/15 TPN start date: 12/15/14>>  Current Nutrition:  Clinimix E 5/15 @40  ml/h Propofol has provided 770 fat kcal in last 24h NPO   Nutritional Goals:  1149-1300 kCal, 135-145 grams of protein per day per new Nutrition assessment Patient receiving ~770 fat kcal/day from propofol currently  Plan:  - Continue Clinimix E 5/15 at 40 ml/h with concern for refeeding, surgery today, still on large amounts of propofol  - advance to goal as tolerated. (TPN will provide 48g protein + 682 kcal) - Will not be able to achieve protein goal with hypocaloric feeding, particularly with added propofol fat kcal. - Daily multivitamin + trace elements in TPN - Continue SSI q4h and monitor CBGs - Mag sulfate 2g x1 - K runs x3  - (Lytes will be given later today due to scheduled surgery) - D/c IVF with TPN start per MD note  - Mon TG q72h - change to weekly once propofol d/c'd - F/u AM labs  HaElicia LampPharmD Clinical Pharmacist - Resident Pager [phone]/23/2016 7:20 AM

## 2014-12-17 ENCOUNTER — Inpatient Hospital Stay (HOSPITAL_COMMUNITY): Payer: BC Managed Care – PPO

## 2014-12-17 ENCOUNTER — Encounter (HOSPITAL_COMMUNITY): Payer: Self-pay | Admitting: General Surgery

## 2014-12-17 DIAGNOSIS — G9340 Encephalopathy, unspecified: Secondary | ICD-10-CM

## 2014-12-17 LAB — CBC
HCT: 26.2 % — ABNORMAL LOW (ref 36.0–46.0)
Hemoglobin: 8.2 g/dL — ABNORMAL LOW (ref 12.0–15.0)
MCH: 29 pg (ref 26.0–34.0)
MCHC: 31.3 g/dL (ref 30.0–36.0)
MCV: 92.6 fL (ref 78.0–100.0)
Platelets: 535 10*3/uL — ABNORMAL HIGH (ref 150–400)
RBC: 2.83 MIL/uL — ABNORMAL LOW (ref 3.87–5.11)
RDW: 14.5 % (ref 11.5–15.5)
WBC: 13.2 10*3/uL — ABNORMAL HIGH (ref 4.0–10.5)

## 2014-12-17 LAB — GLUCOSE, CAPILLARY
GLUCOSE-CAPILLARY: 204 mg/dL — AB (ref 65–99)
Glucose-Capillary: 197 mg/dL — ABNORMAL HIGH (ref 65–99)
Glucose-Capillary: 231 mg/dL — ABNORMAL HIGH (ref 65–99)
Glucose-Capillary: 236 mg/dL — ABNORMAL HIGH (ref 65–99)

## 2014-12-17 LAB — BASIC METABOLIC PANEL
Anion gap: 7 (ref 5–15)
CALCIUM: 7.6 mg/dL — AB (ref 8.9–10.3)
CO2: 27 mmol/L (ref 22–32)
CREATININE: 0.56 mg/dL (ref 0.44–1.00)
Chloride: 103 mmol/L (ref 101–111)
GFR calc non Af Amer: >60 mL/min (ref >60)
Glucose, Bld: 200 mg/dL — ABNORMAL HIGH (ref 65–99)
POTASSIUM: 3.8 mmol/L (ref 3.5–5.1)
SODIUM: 137 mmol/L (ref 135–145)

## 2014-12-17 LAB — MAGNESIUM: MAGNESIUM: 1.7 mg/dL (ref 1.7–2.4)

## 2014-12-17 LAB — PHOSPHORUS: PHOSPHORUS: 3.6 mg/dL (ref 2.5–4.6)

## 2014-12-17 LAB — BODY FLUID CULTURE

## 2014-12-17 MED ORDER — FENTANYL CITRATE (PF) 100 MCG/2ML IJ SOLN
25.0000 ug | INTRAMUSCULAR | Status: DC | PRN
  Administered 2014-12-18 (×3): 100 ug via INTRAVENOUS
  Filled 2014-12-17 (×4): qty 2

## 2014-12-17 MED ORDER — MAGNESIUM SULFATE 4 GM/100ML IV SOLN
4.0000 g | Freq: Once | INTRAVENOUS | Status: AC
  Administered 2014-12-17: 4 g via INTRAVENOUS
  Filled 2014-12-17: qty 100

## 2014-12-17 MED ORDER — TRACE MINERALS CR-CU-F-FE-I-MN-MO-SE-ZN IV SOLN
INTRAVENOUS | Status: AC
  Administered 2014-12-17: 17:00:00 via INTRAVENOUS
  Filled 2014-12-17: qty 1440

## 2014-12-17 MED ORDER — DEXMEDETOMIDINE HCL IN NACL 400 MCG/100ML IV SOLN
0.4000 ug/kg/h | INTRAVENOUS | Status: DC
  Administered 2014-12-17: 1 ug/kg/h via INTRAVENOUS
  Administered 2014-12-17: 0.4 ug/kg/h via INTRAVENOUS
  Administered 2014-12-17: 1 ug/kg/h via INTRAVENOUS
  Administered 2014-12-17: 0.9 ug/kg/h via INTRAVENOUS
  Administered 2014-12-17: 1.2 ug/kg/h via INTRAVENOUS
  Administered 2014-12-17 (×3): 1 ug/kg/h via INTRAVENOUS
  Administered 2014-12-18: 0.9 ug/kg/h via INTRAVENOUS
  Administered 2014-12-18: 1 ug/kg/h via INTRAVENOUS
  Administered 2014-12-18: 0.7 ug/kg/h via INTRAVENOUS
  Filled 2014-12-17 (×3): qty 100
  Filled 2014-12-17 (×2): qty 50
  Filled 2014-12-17 (×5): qty 100

## 2014-12-17 MED ORDER — FLUCONAZOLE IN SODIUM CHLORIDE 400-0.9 MG/200ML-% IV SOLN
400.0000 mg | INTRAVENOUS | Status: DC
  Administered 2014-12-17 – 2014-12-23 (×7): 400 mg via INTRAVENOUS
  Filled 2014-12-17 (×8): qty 200

## 2014-12-17 MED ORDER — POTASSIUM CHLORIDE 10 MEQ/50ML IV SOLN
10.0000 meq | INTRAVENOUS | Status: AC
  Administered 2014-12-17 (×4): 10 meq via INTRAVENOUS
  Filled 2014-12-17 (×4): qty 50

## 2014-12-17 NOTE — Significant Event (Addendum)
Wasted approximately 175cc of fentanyl drip from bag in sink and flushed-wasted this with Jeremy Johann, RN.

## 2014-12-17 NOTE — Progress Notes (Signed)
[place] NOTE  Pharmacy Consult for TPN Indication: Prolonged ileus, NPO x ~7d  Allergies  Allergen Reactions  . Sulfonamide Derivatives Anaphylaxis  . Aspirin Nausea And Vomiting  . Penicillins Hives  . Latex Rash    Patient Measurements: Height: _0  (160 cm) Weight: 296 lb 4.8 oz (134.4 kg) IBW/kg (Calculated) : 52.4 Adjusted Body Weight: 72.9kg  Vital Signs: Temp: 101 F (38.3 C) (06/24 0700) Temp Source: Axillary (06/24 0700) BP: 133/62 mmHg (06/24 0813) Pulse Rate: 95 (06/24 0813) Intake/Output from previous day: 06/23 0701 - 06/24 0700 In: 2890.8 [I.V.:1670.8; NG/GT:60; IV Piggyback:200; TPN:960] Out: 2865 [Urine:1990; Emesis/NG output:600; Drains:250; Blood:25] Intake/Output from this shift: Total I/O In: 64.7 [I.V.:24.7; TPN:40] Out: 150 [Urine:150]  Labs:  Recent Labs  12/15/14 0430 12/16/14 0430 12/17/14 0525  WBC 17.6* 13.1* 13.2*  HGB 8.1* 7.8* 8.2*  HCT 25.3* 24.9* 26.2*  PLT 481* 487* 535*     Recent Labs  12/15/14 0430 12/16/14 0430 12/16/14 1548 12/17/14 0525  NA 140 142  --  137  K 4.1 3.6  --  3.8  CL 109 108  --  103  CO2 23 27  --  27  GLUCOSE 147* 190*  --  200*  BUN 11 7  --  <5*  CREATININE 0.63 0.48  --  0.56  CALCIUM 7.5* 7.9*  --  7.6*  MG  --  1.6*  --  1.7  PHOS  --  3.4  --  3.6  PROT  --  4.4*  --   --   ALBUMIN  --  1.3*  --   --   AST  --  30  --   --   ALT  --  17  --   --   ALKPHOS  --  66  --   --   BILITOT  --  0.6  --   --   PREALBUMIN  --  9.2*  --   --   TRIG  --   --  992*  --    Estimated Creatinine Clearance: 109.4 mL/min (by C-G formula based on Cr of 0.56).    Recent Labs  12/16/14 2352 12/17/14 0344 12/17/14 0735  GLUCAP 193* 197* 204*    Medical History: Past Medical History  Diagnosis Date  . Non-ST elevated myocardial infarction (non-STEMI)   . Dysphasia   . Hypotension   . Hypokalemia   . Non-insulin dependent diabetes mellitus   . Hyperlipidemia   .  Migraine headache   . Hemorrhoids   . History of MRSA infection   . Major depressive disorder   . Bipolar disorder     Insulin Requirements in the past 24 hours:  18 units SSI q4h in past 24h  Assessment: 28 yof admitted 12/08/14 with 2 night hx of perforated appendix. Pt placed NPO and originally tried to medically manage. Appeared to improve initially with abx but had more pain, emesis, no more bowel function, abdomen more tender, wbc increasing. Went for emergent exploration and found to have severe peritonitis + bowel ischemia, underwent appendectomy and SBR. Transferred to ICU. Nutrition recommends TPN initiation if unable to extubate and initiate feeding within 7-10 days of intubation. Pharmacy consulted to initiate TPN on 12/15/14 due to anticipated prolonged ileus as bowel left in discontinuity. Pt NPO x ~7days inpatient  Surgeries/Procedures: 6/17:  Emergency exploration - appendectomy, SBR, abdominal wound vac 6/19: Ex-lap, SB anastomosis, possible abdomen closure (failed) 6/21: Abdominal vacuum change, partial abdomen closure 6/23:  abdomen closure  GI: Prealbumin 9.2. Ruptured appendix, peritonitis, ischemic bowel s/p appendectomy/SBR/abdominal wound vac. Abdominal wall could not be closed with edema/dilation of SB. OGT in place. 553m/24h emesis, 4742m24h drain, 2525m4h blood output. No bowel function yet - LBM 12/09/14. Anticipate post-op ileus as bowel left in discontinuity. Returned to OR 6/23 for abdomen closure >> back to ICU post-op. PMH of dysphagia. PPI  Endo: DM - CBGs up some to 154-204 on TPN post-op (125-166 prior to TPN start)  Lytes: K up to 3.8 s/p 3 k runs yesterday (goal >/=4 with ileus), Mg up to 1.7 s/p Mg sulfate 2g yesterday (goal >/=2 with ileus), phos ok up to 3.6, coCa slightly low 8.8, others wnl  Renal: AKI on admit secondary to dehydration. SCr stable 0.56, normalized CrCl~74. UOP ok 0.5 ml/kg/h  Pulm: VDRF (fiO2 40) - hopeful to extubate soon but  delirium limiting  Cards: BP soft-wnl, HR wnl. HLD, CAD. plavix  Hepatobil: LFTs/tbili/alk phos ok, TG 161>>231 (off propofol)  Neuro: Fent gtt/propofol gtt d/c'd, precedex_0 .5mg43m/h  ID: Ertapenem D#8 + Fluconazole D#1 for septic shock/peritonitis. WBC stable 13.2, Tmax/24 101.6, currently febrile 101. Pen allergy - hives  6/24 fluconazole>> 6/15 Ertapenem>> 6/18 Vanc>> 6/22  6/17 peritoneal fluid>> microaerophilic strep, Ecoli (no S) 6/18 BCx2>> ngf  Best Practices: scds, heparin [place] TPN Access: PICC placed 6/15 TPN start date: 12/15/14>>  Current Nutrition:  Clinimix E 5/15 _1  ml/h Propofol has provided ~896 fat kcal in last 24h (now d/c'd) NPO   Nutritional Goals:  1149-1300 kCal, 135-145 grams of protein per day per new Nutrition assessment Patient receiving ~896 fat kcal/day from propofol in last 24h (now d/c'd)  Plan:  - Increase Clinimix E 5/15 to 60 ml/h (will not give fats for 1st 7 days in ICU on TPN) - advance to goal as tolerated. (TPN will provide 72g protein + 1022 kcal) - Will not be able to achieve protein goal with hypocaloric feeding but can get closer with propofol d/c'd. - Daily multivitamin + trace elements in TPN - Add 15 units insulin to TPN bag + continue SSI q4h and monitor CBGs - Mag sulfate 4g x1 - K runs x4 - TG monitoring to weekly with propofol d/c - F/u AM labs  HaleElicia LamparmD Clinical Pharmacist - Resident Pager [phone] 8:44 AM

## 2014-12-17 NOTE — Progress Notes (Signed)
Inserted a bite block and secured it, pt is biting tube excessively

## 2014-12-17 NOTE — Progress Notes (Signed)
ANTIBIOTIC CONSULT NOTE - INITIAL  Pharmacy Consult for fluconazole Indication: empiric for continued fevers  Allergies  Allergen Reactions  . Sulfonamide Derivatives Anaphylaxis  . Aspirin Nausea And Vomiting  . Penicillins Hives  . Latex Rash    Patient Measurements: Height:  (160 cm) Weight: 296 lb 4.8 oz (134.4 kg) IBW/kg (Calculated) : 52.4 Adjusted Body Weight:   Vital Signs: Temp: 101 F (38.3 C) (06/24 0700) Temp Source: Axillary (06/24 0700) BP: 133/62 mmHg (06/24 0813) Pulse Rate: 95 (06/24 0813) Intake/Output from previous day: 06/23 0701 - 06/24 0700 In: 2890.8 [I.V.:1670.8; NG/GT:60; IV Piggyback:200; TPN:960] Out: 2865 [Urine:1990; Emesis/NG output:600; Drains:250; Blood:25] Intake/Output from this shift: Total I/O In: 64.7 [I.V.:24.7; TPN:40] Out: 150 [Urine:150]  Labs:  Recent Labs  12/15/14 0430 12/16/14 0430 12/17/14 0525  WBC 17.6* 13.1* 13.2*  HGB 8.1* 7.8* 8.2*  PLT 481* 487* 535*  CREATININE 0.63 0.48 0.56   Estimated Creatinine Clearance: 109.4 mL/min (by C-G formula based on Cr of 0.56). No results for input(s): VANCOTROUGH, VANCOPEAK, VANCORANDOM, GENTTROUGH, GENTPEAK, GENTRANDOM, TOBRATROUGH, TOBRAPEAK, TOBRARND, AMIKACINPEAK, AMIKACINTROU, AMIKACIN in the last 72 hours.   Microbiology: Recent Results (from the past 720 hour(s))  Anaerobic culture     Status: None   Collection Time: 12/10/14  1:52 PM  Result Value Ref Range Status   Specimen Description FLUID PERITONEAL  Final   Special Requests ON A SWAB  Final   Gram Stain   Final    MODERATE WBC PRESENT,BOTH PMN AND MONONUCLEAR FEW GRAM POSITIVE COCCI IN PAIRS RARE GRAM POSITIVE COCCI IN CHAINS Results Called toChrisandra Carota [phone] [address] CONFIRMED BY K WILDER    Culture NO ANAEROBES ISOLATED  Final   Report Status 12/16/2014 FINAL  Final  Body fluid culture     Status: None   Collection Time: 12/10/14  1:52 PM  Result Value Ref Range Status   Specimen  Description FLUID PERITONEAL  Final   Special Requests ON A SWAB  Final   Gram Stain   Final    MODERATE WBC PRESENT,BOTH PMN AND MONONUCLEAR RARE GRAM POSITIVE COCCI IN PAIRS Results Called toChrisandra Carota [phone] [address]    Culture   Final    RARE ESCHERICHIA COLI MODERATE MICROAEROPHILIC STREPTOCOCCI Standardized susceptibility testing for this organism is not available. Processing error. Culture reordered on new accession    Report Status 12/14/2014 FINAL  Final  MRSA PCR Screening     Status: None   Collection Time: 12/10/14  9:43 PM  Result Value Ref Range Status   MRSA by PCR NEGATIVE NEGATIVE Final    Comment:        The GeneXpert MRSA Assay (FDA approved for NASAL specimens only), is one component of a comprehensive MRSA colonization surveillance program. It is not intended to diagnose MRSA infection nor to guide or monitor treatment for MRSA infections.   Culture, blood (routine x 2)     Status: None   Collection Time: 12/11/14 10:49 AM  Result Value Ref Range Status   Specimen Description BLOOD LEFT ARM  Final   Special Requests BOTTLES DRAWN AEROBIC ONLY 10CC  Final   Culture NO GROWTH 5 DAYS  Final   Report Status 12/16/2014 FINAL  Final  Culture, blood (routine x 2)     Status: None   Collection Time: 12/11/14 10:55 AM  Result Value Ref Range Status   Specimen Description BLOOD LEFT ARM  Final   Special Requests BOTTLES DRAWN AEROBIC ONLY 2CC  Final   Culture NO GROWTH 5 DAYS  Final   Report Status 12/16/2014 FINAL  Final   Medications:  Anti-infectives    Start     Dose/Rate Route Frequency Ordered Stop   12/17/14 1000  fluconazole (DIFLUCAN) IVPB 400 mg     400 mg 100 mL/hr over 120 Minutes Intravenous Every 24 hours 12/17/14 0841     12/13/14 2200  vancomycin (VANCOCIN) IVPB 1000 mg/200 mL premix  Status:  Discontinued     1,000 mg 200 mL/hr over 60 Minutes Intravenous Every 8 hours 12/13/14 1429 12/15/14 0901   12/11/14 1400  vancomycin  (VANCOCIN) IVPB 1000 mg/200 mL premix  Status:  Discontinued     1,000 mg 200 mL/hr over 60 Minutes Intravenous Every 12 hours 12/11/14 1300 12/13/14 1429   12/10/14 1700  vancomycin (VANCOCIN) 1,500 mg in sodium chloride 0.9 % 500 mL IVPB  Status:  Discontinued     1,500 mg 250 mL/hr over 120 Minutes Intravenous Every 24 hours 12/10/14 1601 12/11/14 1300   12/09/14 1100  ertapenem (INVANZ) 1 g in sodium chloride 0.9 % 50 mL IVPB  Status:  Discontinued    Comments:  She has hives from pcn before, the cross reactivity is low and I think there is some benefit to hopefully avoiding surgery in this patient without other good choices for abx, I think cipro/flagyl has too much resistance and she has perforation   1 g 100 mL/hr over 30 Minutes Intravenous Every 24 hours 12/09/14 0940 12/09/14 1017   12/09/14 1100  ertapenem (INVANZ) 1 g in sodium chloride 0.9 % 50 mL IVPB  Status:  Discontinued    Comments:  She has hives from pcn before, the cross reactivity is low and I think there is some benefit to hopefully avoiding surgery in this patient without other good choices for abx, I think cipro/flagyl has too much resistance and she has perforation   1 g 100 mL/hr over 30 Minutes Intravenous Every 24 hours 12/09/14 1017 12/09/14 1018   12/09/14 1100  ertapenem (INVANZ) 1 g in sodium chloride 0.9 % 50 mL IVPB    Comments:  She has hives from pcn before, the cross reactivity is low and I think there is some benefit to hopefully avoiding surgery in this patient without other good choices for abx, I think cipro/flagyl has too much resistance and she has perforation   1 g 100 mL/hr over 30 Minutes Intravenous Every 24 hours 12/09/14 1020     12/09/14 0945  ertapenem (INVANZ) injection 1 g  Status:  Discontinued    Comments:  She has hives from pcn before, the cross reactivity is low and I think there is some benefit to hopefully avoiding surgery in this patient without other good choices for abx, I think  cipro/flagyl has too much resistance and she has perforation   1 g Intramuscular Every 24 hours 12/09/14 0937 12/09/14 0941   12/08/14 0800  ciprofloxacin (CIPRO) IVPB 400 mg  Status:  Discontinued     400 mg 200 mL/hr over 60 Minutes Intravenous Every 12 hours 12/08/14 0652 12/09/14 0933   12/08/14 0800  metroNIDAZOLE (FLAGYL) IVPB 500 mg  Status:  Discontinued     500 mg 100 mL/hr over 60 Minutes Intravenous Every 8 hours 12/08/14 0652 12/09/14 0933   12/08/14 0330  piperacillin-tazobactam (ZOSYN) IVPB 3.375 g     3.375 g 100 mL/hr over 30 Minutes Intravenous  Once 12/08/14 0328 12/08/14 0513     Assessment:  32 yof initially admitted with abdominal pain and found to have a ruptured appendix. Started on broad-spectrum antibiotics and she continues on ertapenem. However, pt continues to have fevers so now adding fluconazole empirically. Tmax is 101.6, WBC is elevated at 13.2.  Vanc 6/17 (no LD given) >> 6/22 Erta 6/16 >> Cipro 6/15 >> 6/16 Flagyl 6/15 >> 6/16 Zosyn 6/15 x 1 Flucon 6/24>>  6/18 BCx >>NEG 6/17 Peritoneal fluid cx >> GS showing GPC in pairs >> rare ecoli, moderate microaerophilic strep  Goal of Therapy:  Eradication of infection  Plan:  - Fluconazole 400mg  IV Q24H - F/u renal fxn, C&S, clinical status   Jaycey Gens, Drake Leach 12/17/2014,8:41 AM

## 2014-12-17 NOTE — Progress Notes (Signed)
Bite block in place.  

## 2014-12-17 NOTE — Progress Notes (Signed)
PULMONARY / CRITICAL CARE MEDICINE   Name: Allison [name] MRN: [id number] DOB: [birth year]    ADMISSION DATE:  12/07/2014 CONSULTATION DATE:  6/17  REFERRING MD :  Dwain Sarna   CHIEF COMPLAINT:   Severe sepsis Assist w/ vent management   INITIAL PRESENTATION:   71 yof who was admitted 6/14  W/ 2 night h/o perforated appendicitis. She appeared to improve initially with abx and resuscitation Then on 6/17 she had more pain, emesis, no more bowel function and her abdomen was more tender. Her wbc was rising. She went for emergent exploration, found to have severe peritonitis as well as evidence of bowel ischemia. She underwent appendectomy, SB resection and was left in discontinuity. Returned to the ICU on vent. PCCM asked to assist w/ care.   STUDIES:  CT abd/pelvis 6/15: Inflammatory process demonstrated throughout the pelvis with fluid and infiltration in the low pelvis and mesenteric. At appendicolith with abnormal appearing appendix, likely this represents perforated appendicitis. No discrete abscess is identified. There is small bowel wall thickening of the pelvic loops with proximal small bowel obstruction.  SIGNIFICANT EVENTS: Exp lap 6/17 6/19 reanastomosis, open abd vac 6/21 partial abd closure -vac  INTERVAL EVENTS / SUBJECTIVE:  Febrile 101 Agitated on WUA off propofol & fent gtt Good UO   VITAL SIGNS: Temp:  [99.6 F (37.6 C)-101.6 F (38.7 C)] 101 F (38.3 C) (06/24 0700) Pulse Rate:  [68-99] 95 (06/24 0813) Resp:  [0-28] 25 (06/24 0813) BP: (94-139)/(39-110) 133/62 mmHg (06/24 0813) SpO2:  [94 %-100 %] 98 % (06/24 0813) FiO2 (%):  [40 %] 40 % (06/24 0813) HEMODYNAMICS:   VENTILATOR SETTINGS: Vent Mode:  [-] PRVC FiO2 (%):  [40 %] 40 % Set Rate:  [20 bmp] 20 bmp Vt Set:  [500 mL] 500 mL PEEP:  [5 cmH20] 5 cmH20 Plateau Pressure:  [18 cmH20-26 cmH20] 18 cmH20 INTAKE / OUTPUT:  Intake/Output Summary (Last 24 hours) at 12/17/14 8101 Last data filed at  12/17/14 0800  Gross per 24 hour  Intake 2839.83 ml  Output   3015 ml  Net -175.17 ml    PHYSICAL EXAMINATION: General:  Acutely ill  Neuro:  Sedated on vent , RASS +2 HEENT:  Orally intubated. Neck large Cardiovascular:  Rrr, no M, 80's Lungs:  Decreased bases , mild tan secretions Abdomen:  Obese, abd closed, vac dressing intact  Musculoskeletal:  Intact  Skin:  Intact   LABS:  CBC  Recent Labs Lab 12/15/14 0430 12/16/14 0430 12/17/14 0525  WBC 17.6* 13.1* 13.2*  HGB 8.1* 7.8* 8.2*  HCT 25.3* 24.9* 26.2*  PLT 481* 487* 535*   Coag's  Recent Labs Lab 12/10/14 2028  APTT 44*  INR 1.43   BMET  Recent Labs Lab 12/15/14 0430 12/16/14 0430 12/17/14 0525  NA 140 142 137  K 4.1 3.6 3.8  CL 109 108 103  CO2 23 27 27   BUN 11 7 <5*  CREATININE 0.63 0.48 0.56  GLUCOSE 147* 190* 200*   Electrolytes  Recent Labs Lab 12/13/14 0330  12/15/14 0430 12/16/14 0430 12/17/14 0525  CALCIUM 7.6*  < > 7.5* 7.9* 7.6*  MG 2.1  --   --  1.6* 1.7  PHOS 4.2  --   --  3.4 3.6  < > = values in this interval not displayed. Sepsis Markers  Recent Labs Lab 12/10/14 1700 12/12/14 0400  LATICACIDVEN 1.0 0.7   ABG  Recent Labs Lab 12/10/14 1812 12/10/14 2033 12/11/14 0401  PHART 7.273* 7.310*  7.295*  PCO2ART 35.6 34.4* 35.9  PO2ART 85.0 89.0 83.0   Liver Enzymes  Recent Labs Lab 12/14/14 0405 12/16/14 0430  AST 19 30  ALT 12* 17  ALKPHOS 53 66  BILITOT 0.7 0.6  ALBUMIN 1.2* 1.3*   Cardiac Enzymes No results for input(s): TROPONINI, PROBNP in the last 168 hours. Glucose  Recent Labs Lab 12/16/14 1117 12/16/14 1525 12/16/14 1907 12/16/14 2352 12/17/14 0344 12/17/14 0735  GLUCAP 176* 167* 154* 193* 197* 204*    Imaging Dg Chest Port 1 View  12/17/2014   CLINICAL DATA:  Respiratory failure.  EXAM: PORTABLE CHEST - 1 VIEW  COMPARISON:  12/16/2014 .  FINDINGS: Endotracheal tube and NG tube in stable position. Stable cardiomegaly with mild  pulmonary vascular congestion. Mild interstitial prominence. Mild component congestive heart failure most likely present. Small left pleural effusion scratched. Low lung volumes with basilar subsegmental atelectasis. No pneumothorax .  IMPRESSION: 1. Lines and tubes in stable position . 2. Cardiomegaly with mild pulmonary venous congestion and interstitial prominence. Small left pleural effusion. Findings consistent with mild congestive heart failure. Interstitial edema slightly more prominent on today's exam. 3. Low lung volumes with mild bibasilar subsegmental atelectasis.   Electronically Signed   By: Maisie Fus  Register   On: 12/17/2014 07:50   Image reviewed  ASSESSMENT / PLAN:  PULMONARY OETT 6/17 >>  A: Acute respiratory failure in setting of shock Probable OSA  P:   SBTs with goal extubation, delerium limiting  CARDIOVASCULAR CVL 6/17 PICC >>> A:  Septic shock  in setting of peritonitis -resolved HTN [place] in OR, resolved P:  MAP goal >65   RENAL A:   AKI, improving  NAG metabolic acidosis  P:   Replete lytes asneeded Strict I&O   GASTROINTESTINAL A:   Ruptured appendix Peritonitis  Ischemic bowel Now s/p expl lap, appendectomy, SB resection, and placement of wound vac 6/17;  -abd closure 6/23 Protein calorie malnutrition P:  Wound care per surg  TNA per pharmacy  HEMATOLOGIC A:  Anemia  P:  Trend CBC [place] heparin  Transfuse for hgb < 7   INFECTIOUS Peritoneal fluid 6/17 >> strep, e coli  Blood cx 6/18 >>  ng A:   Sepsis -Peritonitis in setting of ruptured appendix  Possible bacterial translocation d/t ischemic bowel  P:    invanz 6/15>>> vanco 6/18 >> 6/22  Add diflucan for persistent fever (given perforation.)  Rpt blood cx 6/24 >>   ENDOCRINE A:   Type II DM Hyperglycemia  P:   q4h ssi    NEUROLOGIC A:   Acute encephalopathy - delerium Pain  P:   RASS goal: 0 PAD protocol -dc prop and fent gtt - 6/24 use precedex & it fent    WUA daily   FAMILY  - Updates: no family at bedside  - Inter-disciplinary family meet or Palliative Care meeting due by: NA    TODAY'S SUMMARY:  Peritonitis from ruptured appendix and ischemic bowel.  Persistent fever , on TNA . Work towards extubation once delrium resolves  The patient is critically ill with multiple organ systems failure and requires high complexity decision making for assessment and support, frequent evaluation and titration of therapies, application of advanced monitoring technologies and extensive interpretation of multiple databases. Critical Care Time devoted to patient care services described in this note independent of APP time is 35 minutes.   Cyril Mourning MD. Tonny Bollman. [place] [facility] Pager [phone] If no response call [phone]  12/17/2014, 8:28 AM

## 2014-12-17 NOTE — Progress Notes (Signed)
Inpatient Diabetes Program Recommendations  AACE/ADA: New Consensus Statement on Inpatient Glycemic Control (2013)  Target Ranges:  Prepandial:   less than 140 mg/dL      Peak postprandial:   less than 180 mg/dL (1-2 hours)      Critically ill patients:  140 - 180 mg/dL   Results for Allison [name], Allison [name] (MRN [id number]) as of 12/17/2014 09:15  Ref. Range 12/16/2014 07:34 12/16/2014 11:17 12/16/2014 15:25 12/16/2014 19:07 12/16/2014 23:52 12/17/2014 03:44 12/17/2014 07:35  Glucose-Capillary Latest Ref Range: 65-99 mg/dL 179 (H) 150 (H) 569 (H) 154 (H) 193 (H) 197 (H) 204 (H)   Current orders for Inpatient glycemic control: Novolog 0-15 units Q4H  Inpatient Diabetes Program Recommendations Insulin - Basal: Over the past 24 horus patient has received a total of Novolog 23 units for correction. Please consider ordering low dose basal insulin; recommend starting with Lantus 10 units Q24H.  Thanks, Orlando Penner, RN, MSN, CCRN, CDE Diabetes Coordinator Inpatient Diabetes Program [phone] (Team Pager from 8am to 5pm) [phone] (AP office) [phone] [facility] office) [phone] [facility] office)

## 2014-12-17 NOTE — Consult Note (Addendum)
WOC wound consult note Reason for Consult: Consult requested for Vac dressing change assistance.  Pt went to the OR yesterday for Vac application to abd wound and dressing is due to be changed on Sat. WOC team is not available on the weekend. Called CCS PA Megan, to discuss plan of care.  She will notify the CCS team and they will plan to change on Sat with  bedside nurse assistance.  Supplies ordered to the bedside: large black foam sponge and Mepitel contact dressing.  WOC will plan to assess wound and change dressing on Mon. Cammie Mcgee MSN, RN, CWOCN, [place], CNS [phone]

## 2014-12-17 NOTE — Progress Notes (Signed)
1 Day Post-Op  Subjective: Intubated and sedated  Objective: Vital signs in last 24 hours: Temp:  [99.8 F (37.7 C)-101.7 F (38.7 C)] 101.7 F (38.7 C) (06/24 1100) Pulse Rate:  [63-99] 63 (06/24 1300) Resp:  [15-27] 21 (06/24 1300) BP: (104-139)/(39-110) 125/60 mmHg (06/24 1300) SpO2:  [95 %-100 %] 97 % (06/24 1300) FiO2 (%):  [40 %] 40 % (06/24 1152) Last BM Date: 12/09/14  Intake/Output from previous day: 06/23 0701 - 06/24 0700 In: 2890.8 [I.V.:1670.8; NG/GT:60; IV Piggyback:200; TPN:960] Out: 2865 [Urine:1990; Emesis/NG output:600; Drains:250; Blood:25] Intake/Output this shift: Total I/O In: 580 [I.V.:90; NG/GT:30; IV Piggyback:300; TPN:160] Out: 470 [Urine:470]  Resp: clear to auscultation bilaterally and on vent Cardio: regular rate and rhythm GI: soft, vac in place. fascia closed  Lab Results:   Recent Labs  12/16/14 0430 12/17/14 0525  WBC 13.1* 13.2*  HGB 7.8* 8.2*  HCT 24.9* 26.2*  PLT 487* 535*   BMET  Recent Labs  12/16/14 0430 12/17/14 0525  NA 142 137  K 3.6 3.8  CL 108 103  CO2 27 27  GLUCOSE 190* 200*  BUN 7 <5*  CREATININE 0.48 0.56  CALCIUM 7.9* 7.6*   PT/INR No results for input(s): LABPROT, INR in the last 72 hours. ABG No results for input(s): PHART, HCO3 in the last 72 hours.  Invalid input(s): PCO2, PO2  Studies/Results: Dg Chest Port 1 View  12/17/2014   CLINICAL DATA:  Respiratory failure.  EXAM: PORTABLE CHEST - 1 VIEW  COMPARISON:  12/16/2014 .  FINDINGS: Endotracheal tube and NG tube in stable position. Stable cardiomegaly with mild pulmonary vascular congestion. Mild interstitial prominence. Mild component congestive heart failure most likely present. Small left pleural effusion scratched. Low lung volumes with basilar subsegmental atelectasis. No pneumothorax .  IMPRESSION: 1. Lines and tubes in stable position . 2. Cardiomegaly with mild pulmonary venous congestion and interstitial prominence. Small left pleural  effusion. Findings consistent with mild congestive heart failure. Interstitial edema slightly more prominent on today's exam. 3. Low lung volumes with mild bibasilar subsegmental atelectasis.   Electronically Signed   By: Maisie Fus  Register   On: 12/17/2014 07:50   Dg Chest Port 1 View  12/16/2014   CLINICAL DATA:  Acute respiratory failure  EXAM: PORTABLE CHEST - 1 VIEW  COMPARISON:  12/15/2014  FINDINGS: Endotracheal tube in good position. NG tube in the stomach. Right arm PICC tip difficult to visualize due to overlying EKG wires. Catheter tip appears to be at the cavoatrial junction and unchanged  Cardiac enlargement with vascular congestion. Improvement in right lower lobe atelectasis. No edema or effusion.  IMPRESSION: Slightly improved aeration in the lung bases with decrease in right lower lobe atelectasis. Support lines remain in good position.   Electronically Signed   By: Marlan Palau M.D.   On: 12/16/2014 07:44    Anti-infectives: Anti-infectives    Start     Dose/Rate Route Frequency Ordered Stop   12/17/14 1000  fluconazole (DIFLUCAN) IVPB 400 mg     400 mg 100 mL/hr over 120 Minutes Intravenous Every 24 hours 12/17/14 0841     12/13/14 2200  vancomycin (VANCOCIN) IVPB 1000 mg/200 mL premix  Status:  Discontinued     1,000 mg 200 mL/hr over 60 Minutes Intravenous Every 8 hours 12/13/14 1429 12/15/14 0901   12/11/14 1400  vancomycin (VANCOCIN) IVPB 1000 mg/200 mL premix  Status:  Discontinued     1,000 mg 200 mL/hr over 60 Minutes Intravenous Every 12 hours 12/11/14  1300 12/13/14 1429   12/10/14 1700  vancomycin (VANCOCIN) 1,500 mg in sodium chloride 0.9 % 500 mL IVPB  Status:  Discontinued     1,500 mg 250 mL/hr over 120 Minutes Intravenous Every 24 hours 12/10/14 1601 12/11/14 1300   12/09/14 1100  ertapenem (INVANZ) 1 g in sodium chloride 0.9 % 50 mL IVPB  Status:  Discontinued    Comments:  She has hives from pcn before, the cross reactivity is low and I think there is some  benefit to hopefully avoiding surgery in this patient without other good choices for abx, I think cipro/flagyl has too much resistance and she has perforation   1 g 100 mL/hr over 30 Minutes Intravenous Every 24 hours 12/09/14 0940 12/09/14 1017   12/09/14 1100  ertapenem (INVANZ) 1 g in sodium chloride 0.9 % 50 mL IVPB  Status:  Discontinued    Comments:  She has hives from pcn before, the cross reactivity is low and I think there is some benefit to hopefully avoiding surgery in this patient without other good choices for abx, I think cipro/flagyl has too much resistance and she has perforation   1 g 100 mL/hr over 30 Minutes Intravenous Every 24 hours 12/09/14 1017 12/09/14 1018   12/09/14 1100  ertapenem (INVANZ) 1 g in sodium chloride 0.9 % 50 mL IVPB    Comments:  She has hives from pcn before, the cross reactivity is low and I think there is some benefit to hopefully avoiding surgery in this patient without other good choices for abx, I think cipro/flagyl has too much resistance and she has perforation   1 g 100 mL/hr over 30 Minutes Intravenous Every 24 hours 12/09/14 1020     12/09/14 0945  ertapenem (INVANZ) injection 1 g  Status:  Discontinued    Comments:  She has hives from pcn before, the cross reactivity is low and I think there is some benefit to hopefully avoiding surgery in this patient without other good choices for abx, I think cipro/flagyl has too much resistance and she has perforation   1 g Intramuscular Every 24 hours 12/09/14 0937 12/09/14 0941   12/08/14 0800  ciprofloxacin (CIPRO) IVPB 400 mg  Status:  Discontinued     400 mg 200 mL/hr over 60 Minutes Intravenous Every 12 hours 12/08/14 0652 12/09/14 0933   12/08/14 0800  metroNIDAZOLE (FLAGYL) IVPB 500 mg  Status:  Discontinued     500 mg 100 mL/hr over 60 Minutes Intravenous Every 8 hours 12/08/14 0652 12/09/14 0933   12/08/14 0330  piperacillin-tazobactam (ZOSYN) IVPB 3.375 g     3.375 g 100 mL/hr over 30 Minutes  Intravenous  Once 12/08/14 0328 12/08/14 0513      Assessment/Plan: s/p Procedure(s): VACUUM CHANGE  AND WOUND CLOSURE  (N/A) wean vent as tolerated.   Continue abx. Vac to be changed at bedside now 3 days a week. Please use adaptic at base of wound  LOS: 9 days    TOTH III,Kahle Mcqueen S 12/17/2014

## 2014-12-18 ENCOUNTER — Inpatient Hospital Stay (HOSPITAL_COMMUNITY): Payer: BC Managed Care – PPO

## 2014-12-18 DIAGNOSIS — E876 Hypokalemia: Secondary | ICD-10-CM

## 2014-12-18 DIAGNOSIS — N170 Acute kidney failure with tubular necrosis: Secondary | ICD-10-CM

## 2014-12-18 DIAGNOSIS — I2510 Atherosclerotic heart disease of native coronary artery without angina pectoris: Secondary | ICD-10-CM

## 2014-12-18 LAB — CBC
HCT: 24.9 % — ABNORMAL LOW (ref 36.0–46.0)
HEMOGLOBIN: 7.9 g/dL — AB (ref 12.0–15.0)
MCH: 29.3 pg (ref 26.0–34.0)
MCHC: 31.7 g/dL (ref 30.0–36.0)
MCV: 92.2 fL (ref 78.0–100.0)
Platelets: 484 10*3/uL — ABNORMAL HIGH (ref 150–400)
RBC: 2.7 MIL/uL — ABNORMAL LOW (ref 3.87–5.11)
RDW: 14.4 % (ref 11.5–15.5)
WBC: 12.9 10*3/uL — AB (ref 4.0–10.5)

## 2014-12-18 LAB — BASIC METABOLIC PANEL
ANION GAP: 8 (ref 5–15)
BUN: 8 mg/dL (ref 6–20)
CALCIUM: 8 mg/dL — AB (ref 8.9–10.3)
CHLORIDE: 103 mmol/L (ref 101–111)
CO2: 27 mmol/L (ref 22–32)
CREATININE: 0.5 mg/dL (ref 0.44–1.00)
GFR calc non Af Amer: >60 mL/min (ref >60)
GLUCOSE: 262 mg/dL — AB (ref 65–99)
Potassium: 4.2 mmol/L (ref 3.5–5.1)
Sodium: 138 mmol/L (ref 135–145)

## 2014-12-18 LAB — GLUCOSE, CAPILLARY
GLUCOSE-CAPILLARY: 242 mg/dL — AB (ref 65–99)
GLUCOSE-CAPILLARY: 251 mg/dL — AB (ref 65–99)
GLUCOSE-CAPILLARY: 260 mg/dL — AB (ref 65–99)
Glucose-Capillary: 275 mg/dL — ABNORMAL HIGH (ref 65–99)
Glucose-Capillary: 259 mg/dL — ABNORMAL HIGH (ref 65–99)
Glucose-Capillary: 194 mg/dL — ABNORMAL HIGH (ref 65–99)
Glucose-Capillary: 166 mg/dL — ABNORMAL HIGH (ref 65–99)

## 2014-12-18 LAB — PHOSPHORUS: PHOSPHORUS: 3.6 mg/dL (ref 2.5–4.6)

## 2014-12-18 LAB — MAGNESIUM: Magnesium: 2.1 mg/dL (ref 1.7–2.4)

## 2014-12-18 MED ORDER — HYDROMORPHONE HCL 1 MG/ML IJ SOLN
1.0000 mg | INTRAMUSCULAR | Status: DC | PRN
  Administered 2014-12-18 – 2014-12-20 (×16): 1 mg via INTRAVENOUS
  Filled 2014-12-18 (×17): qty 1

## 2014-12-18 MED ORDER — TRACE MINERALS CR-CU-MN-SE-ZN 10-1000-500-60 MCG/ML IV SOLN
INTRAVENOUS | Status: AC
  Administered 2014-12-18: 18:00:00 via INTRAVENOUS
  Filled 2014-12-18: qty 1920

## 2014-12-18 MED ORDER — ALBUTEROL SULFATE (2.5 MG/3ML) 0.083% IN NEBU
2.5000 mg | INHALATION_SOLUTION | RESPIRATORY_TRACT | Status: DC | PRN
  Administered 2014-12-18 (×2): 2.5 mg via RESPIRATORY_TRACT
  Filled 2014-12-18 (×2): qty 3

## 2014-12-18 NOTE — Progress Notes (Signed)
eLink Physician-Brief Progress Note Patient Name: Allison [name] DOB: 1960/07/22 MRN: [id number]   Date of Service  12/18/2014  HPI/Events of Note  Patient extubated today.  Nurse notes wheezing and slight increase in work of breathing.  I confirmed with camera.  Patient able to speak in complete sentences.  Patient also notes pain not well controlled.  Patient uses albuterol inhaler as an outpatient.  eICU Interventions  Albuterol treatments every 4 hrs Change dilaudid to fentanyl every 2 hrs as needed     Intervention Category Intermediate Interventions: Abdominal pain - evaluation and management  Henry Russel, P 12/18/2014, 7:36 PM

## 2014-12-18 NOTE — Progress Notes (Signed)
2 Days Post-Op  Subjective: Intubated and sedated  Objective: Vital signs in last 24 hours: Temp:  [97.9 F (36.6 C)-101.7 F (38.7 C)] 98.7 F (37.1 C) (06/25 0728) Pulse Rate:  [56-95] 56 (06/25 0700) Resp:  [10-26] 20 (06/25 0700) BP: (104-138)/(39-69) 127/62 mmHg (06/25 0600) SpO2:  [96 %-99 %] 98 % (06/25 0700) FiO2 (%):  [40 %] 40 % (06/25 0600) Last BM Date: 12/09/14  Intake/Output from previous day: 06/24 0701 - 06/25 0700 In: 2593.7 [I.V.:737.4; NG/GT:70; IV Piggyback:550; TPN:1236.3] Out: 2525 [Urine:2050; Emesis/NG output:350; Drains:125] Intake/Output this shift:    Resp: clear to auscultation bilaterally and on vent Cardio: regular rate and rhythm GI: soft. quiet. vac intact  Lab Results:   Recent Labs  12/17/14 0525 12/18/14 0430  WBC 13.2* 12.9*  HGB 8.2* 7.9*  HCT 26.2* 24.9*  PLT 535* 484*   BMET  Recent Labs  12/17/14 0525 12/18/14 0430  NA 137 138  K 3.8 4.2  CL 103 103  CO2 27 27  GLUCOSE 200* 262*  BUN <5* 8  CREATININE 0.56 0.50  CALCIUM 7.6* 8.0*   PT/INR No results for input(s): LABPROT, INR in the last 72 hours. ABG No results for input(s): PHART, HCO3 in the last 72 hours.  Invalid input(s): PCO2, PO2  Studies/Results: Dg Chest Port 1 View  12/17/2014   CLINICAL DATA:  Respiratory failure.  EXAM: PORTABLE CHEST - 1 VIEW  COMPARISON:  12/16/2014 .  FINDINGS: Endotracheal tube and NG tube in stable position. Stable cardiomegaly with mild pulmonary vascular congestion. Mild interstitial prominence. Mild component congestive heart failure most likely present. Small left pleural effusion scratched. Low lung volumes with basilar subsegmental atelectasis. No pneumothorax .  IMPRESSION: 1. Lines and tubes in stable position . 2. Cardiomegaly with mild pulmonary venous congestion and interstitial prominence. Small left pleural effusion. Findings consistent with mild congestive heart failure. Interstitial edema slightly more prominent on  today's exam. 3. Low lung volumes with mild bibasilar subsegmental atelectasis.   Electronically Signed   By: Maisie Fus  Register   On: 12/17/2014 07:50    Anti-infectives: Anti-infectives    Start     Dose/Rate Route Frequency Ordered Stop   12/17/14 1000  fluconazole (DIFLUCAN) IVPB 400 mg     400 mg 100 mL/hr over 120 Minutes Intravenous Every 24 hours 12/17/14 0841     12/13/14 2200  vancomycin (VANCOCIN) IVPB 1000 mg/200 mL premix  Status:  Discontinued     1,000 mg 200 mL/hr over 60 Minutes Intravenous Every 8 hours 12/13/14 1429 12/15/14 0901   12/11/14 1400  vancomycin (VANCOCIN) IVPB 1000 mg/200 mL premix  Status:  Discontinued     1,000 mg 200 mL/hr over 60 Minutes Intravenous Every 12 hours 12/11/14 1300 12/13/14 1429   12/10/14 1700  vancomycin (VANCOCIN) 1,500 mg in sodium chloride 0.9 % 500 mL IVPB  Status:  Discontinued     1,500 mg 250 mL/hr over 120 Minutes Intravenous Every 24 hours 12/10/14 1601 12/11/14 1300   12/09/14 1100  ertapenem (INVANZ) 1 g in sodium chloride 0.9 % 50 mL IVPB  Status:  Discontinued    Comments:  She has hives from pcn before, the cross reactivity is low and I think there is some benefit to hopefully avoiding surgery in this patient without other good choices for abx, I think cipro/flagyl has too much resistance and she has perforation   1 g 100 mL/hr over 30 Minutes Intravenous Every 24 hours 12/09/14 0940 12/09/14 1017  12/09/14 1100  ertapenem (INVANZ) 1 g in sodium chloride 0.9 % 50 mL IVPB  Status:  Discontinued    Comments:  She has hives from pcn before, the cross reactivity is low and I think there is some benefit to hopefully avoiding surgery in this patient without other good choices for abx, I think cipro/flagyl has too much resistance and she has perforation   1 g 100 mL/hr over 30 Minutes Intravenous Every 24 hours 12/09/14 1017 12/09/14 1018   12/09/14 1100  ertapenem (INVANZ) 1 g in sodium chloride 0.9 % 50 mL IVPB    Comments:  She  has hives from pcn before, the cross reactivity is low and I think there is some benefit to hopefully avoiding surgery in this patient without other good choices for abx, I think cipro/flagyl has too much resistance and she has perforation   1 g 100 mL/hr over 30 Minutes Intravenous Every 24 hours 12/09/14 1020     12/09/14 0945  ertapenem (INVANZ) injection 1 g  Status:  Discontinued    Comments:  She has hives from pcn before, the cross reactivity is low and I think there is some benefit to hopefully avoiding surgery in this patient without other good choices for abx, I think cipro/flagyl has too much resistance and she has perforation   1 g Intramuscular Every 24 hours 12/09/14 0937 12/09/14 0941   12/08/14 0800  ciprofloxacin (CIPRO) IVPB 400 mg  Status:  Discontinued     400 mg 200 mL/hr over 60 Minutes Intravenous Every 12 hours 12/08/14 0652 12/09/14 0933   12/08/14 0800  metroNIDAZOLE (FLAGYL) IVPB 500 mg  Status:  Discontinued     500 mg 100 mL/hr over 60 Minutes Intravenous Every 8 hours 12/08/14 0652 12/09/14 0933   12/08/14 0330  piperacillin-tazobactam (ZOSYN) IVPB 3.375 g     3.375 g 100 mL/hr over 30 Minutes Intravenous  Once 12/08/14 0328 12/08/14 0513      Assessment/Plan: s/p Procedure(s): VACUUM CHANGE  AND WOUND CLOSURE  (N/A) Can change subcutaneous vac at bedside. please use adaptic at base of wound  Continue invanz and diflucan Wean vent per CCM  LOS: 10 days    TOTH III,Marzelle Rutten S 12/18/2014

## 2014-12-18 NOTE — Procedures (Signed)
Extubation Procedure Note  Patient Details:   Name: Allison [name] DOB: 11-30-1960 MRN: [id number]   Airway Documentation:     Evaluation  O2 sats: stable throughout Complications: No apparent complications Patient did tolerate procedure well. Bilateral Breath Sounds: Diminished Suctioning: Airway Yes   Pt extubated per MD order.  Pt tolerated well, pt placed on 4l [place].  RT will continue to monitor.  Ronny Flurry 12/18/2014, 12:02 PM

## 2014-12-18 NOTE — Progress Notes (Signed)
[facility] NOTE  Pharmacy Consult for Allison [name] Indication: Prolonged ileus, NPO x ~7d  Allergies  Allergen Reactions  . Sulfonamide Derivatives Anaphylaxis  . Aspirin Nausea And Vomiting  . Penicillins Hives  . Latex Rash    Patient Measurements: Height: 5\' 3"  (160 cm) Weight: 296 lb 4.8 oz (134.4 kg) IBW/kg (Calculated) : 52.4 Adjusted Body Weight: 73kg  Vital Signs: Temp: 98.7 F (37.1 C) (06/25 0728) Temp Source: Axillary (06/25 0728) BP: 127/62 mmHg (06/25 0600) Pulse Rate: 56 (06/25 0700) Intake/Output from previous day: 06/24 0701 - 06/25 0700 In: 2593.7 [I.V.:737.4; NG/GT:70; IV Piggyback:550; Allison [name]:1236.3] Out: 2525 [Urine:2050; Emesis/NG output:350; Drains:125] Intake/Output from this shift:    Labs:  Recent Labs  12/16/14 0430 12/17/14 0525 12/18/14 0430  WBC 13.1* 13.2* 12.9*  HGB 7.8* 8.2* 7.9*  HCT 24.9* 26.2* 24.9*  PLT 487* 535* 484*     Recent Labs  12/16/14 0430 12/16/14 1548 12/17/14 0525 12/18/14 0430  NA 142  --  137 138  K 3.6  --  3.8 4.2  CL 108  --  103 103  CO2 27  --  27 27  GLUCOSE 190*  --  200* 262*  BUN 7  --  <5* 8  CREATININE 0.48  --  0.56 0.50  CALCIUM 7.9*  --  7.6* 8.0*  MG 1.6*  --  1.7 2.1  PHOS 3.4  --  3.6 3.6  PROT 4.4*  --   --   --   ALBUMIN 1.3*  --   --   --   AST 30  --   --   --   ALT 17  --   --   --   ALKPHOS 66  --   --   --   BILITOT 0.6  --   --   --   PREALBUMIN 9.2*  --   --   --   TRIG  --  992*  --   --    Estimated Creatinine Clearance: 109.4 mL/min (by C-G formula based on Cr of 0.5).    Recent Labs  12/17/14 1946 12/17/14 2354 12/18/14 0422  GLUCAP 236* 242* 275*    Insulin Requirements in the past 24 hours:  36 units SSI q4h + 15 units regular insulin in Allison [name] bag  Assessment: 13 yof admitted 12/08/14 with 2 night hx of perforated appendix. Pt placed NPO and originally tried to medically manage. Appeared to improve initially with abx but had more pain, emesis, no  more bowel function, abdomen more tender, wbc increasing. Went for emergent exploration and found to have severe peritonitis + bowel ischemia, underwent appendectomy and SBR. Transferred to ICU. Nutrition recommends Allison [name] initiation if unable to extubate and initiate feeding within 7-10 days of intubation. Pharmacy consulted to initiate Allison [name] on 12/15/14 due to anticipated prolonged ileus as bowel left in discontinuity. Pt NPO x ~7days inpatient  Surgeries/Procedures: 6/17:  Emergency exploration - appendectomy, SBR, abdominal wound vac 6/19: Ex-lap, SB anastomosis, possible abdomen closure (failed) 6/21: Abdominal vacuum change, partial abdomen closure 6/23: abdomen closure  GI: Prealbumin 9.2. Ruptured appendix, peritonitis, ischemic bowel s/p appendectomy/SBR/abdominal wound vac. Abdominal wall could not be closed with edema/dilation of SB. OGT in place. 366ml/24h emesis/OG output, 132ml/24h drain output- both decreased. No bowel function yet - LBM 12/09/14. Anticipate post-op ileus as bowel left in discontinuity. Returned to OR 6/23 for abdomen closure >> back to ICU post-op. PMH of dysphagia. PPI  Endo: DM2 - insulin added to Allison [name] bag  yesterday, but CBGs increased to 204-275. Prior to starting Allison [name], her sugars were well controlled, though it appears she is insulin dependent at home. Her A1C on 12/07/2014 was 6  Lytes: K 4.2 s/p 4 runs yesterday (goal >/=4 with ileus), Mg 2.1 s/p 4g mag sulfate yesterday (goal >/=2 with ileus), phos stable at 3.6, CorCa 10.2 (ca x phos product = 36)  Renal: AKI on admit secondary to dehydration. SCr now stable at 0.5, normalized CrCl~74. UOP ok 0.5 ml/kg/h  Pulm: VDRF (fiO2 40) - hopeful to extubate soon but delirium limiting  Cards: BP soft-wnl, HR wnl. HLD, CAD. plavix  Hepatobil: LFTs/tbili/alk phos ok, TG 161>231>992 on 6/23. She is now off propofol and not receiving IVFE with Allison [name]. Will continue to watch  Neuro: Fent gtt/propofol gtt d/c'd,  precedex_0 .101m/kg/h  ID: Ertapenem D#9 + Fluconazole D#2 for septic shock/peritonitis. WBC 12.9, Tmax/24 101.7.  fluconazole 6/24>> Ertapenem 6/15>> Vanc 6/18>> 6/22  6/17 peritoneal fluid>> microaerophilic strep, Ecoli (no S) 6/18 BCx2>> ngf  Best Practices: scds, heparin Allison Allison [name] Access: PICC placed 6/15 Allison [name] start date: 12/15/14>>  Current Nutrition:  Clinimix E 5/15 @ 60 ml/h NPO   Nutritional Goals:  1149-1300 kCal, 135-145 grams of protein per day per new Nutrition assessment  Plan:  - Increase Clinimix E 5/15 to 80 ml/h (will not give fats for 1st 7 days in ICU on Allison [name]- today is D#4 without fats, though patient received propofol for 2 of those days and trigs are now very elevated) Allison [name] will provide 96g protein and 1363 kcal - Will not be able to achieve protein goal with premixed Allison [name] and hypocaloric goals - Daily multivitamin + trace elements in Allison [name] - Increase insulin in Allison [name] bag to 45 units with elevated CBGs and planned rate increase + continue SSI q4h and monitor CBGs - TG monitoring to weekly with propofol d/c-next check will be Monday 6/27 - BMET, mag and phos in the morning  Allison Allison [name] D. Anh Mangano, PharmD, BCPS Clinical Pharmacist Pager: [phone]/25/2016 8:17 AM

## 2014-12-18 NOTE — Progress Notes (Signed)
PULMONARY / CRITICAL CARE MEDICINE   Name: Allison [name] MRN: [id number] DOB: 01/09/1961    ADMISSION DATE:  12/07/2014 CONSULTATION DATE:  6/17  REFERRING MD :  Dwain Sarna   CHIEF COMPLAINT:   Severe sepsis Assist w/ vent management   INITIAL PRESENTATION:   62 yof who was admitted 6/14  W/ 2 night h/o perforated appendicitis. She appeared to improve initially with abx and resuscitation Then on 6/17 she had more pain, emesis, no more bowel function and her abdomen was more tender. Her wbc was rising. She went for emergent exploration, found to have severe peritonitis as well as evidence of bowel ischemia. She underwent appendectomy, SB resection and was left in discontinuity. Returned to the ICU on vent. PCCM asked to assist w/ care.   STUDIES:  CT abd/pelvis 6/15: Inflammatory process demonstrated throughout the pelvis with fluid and infiltration in the low pelvis and mesenteric. At appendicolith with abnormal appearing appendix, likely this represents perforated appendicitis. No discrete abscess is identified. There is small bowel wall thickening of the pelvic loops with proximal small bowel obstruction.  SIGNIFICANT EVENTS: Exp lap 6/17 6/19 reanastomosis, open abd vac 6/21 partial abd closure -vac abdo closed  INTERVAL EVENTS / SUBJECTIVE:  Remains sedated and vented   VITAL SIGNS: Temp:  [97.9 F (36.6 C)-101.2 F (38.4 C)] 99 F (37.2 C) (06/25 1118) Pulse Rate:  [54-67] 54 (06/25 1100) Resp:  [10-26] 22 (06/25 1100) BP: (115-137)/(55-69) 135/66 mmHg (06/25 1100) SpO2:  [97 %-99 %] 99 % (06/25 1100) FiO2 (%):  [40 %] 40 % (06/25 0800) HEMODYNAMICS:   VENTILATOR SETTINGS: Vent Mode:  [-] CPAP;PSV FiO2 (%):  [40 %] 40 % Set Rate:  [20 bmp] 20 bmp Vt Set:  [500 mL] 500 mL PEEP:  [5 cmH20] 5 cmH20 Pressure Support:  [12 cmH20] 12 cmH20 Plateau Pressure:  [21 cmH20-23 cmH20] 21 cmH20 INTAKE / OUTPUT:  Intake/Output Summary (Last 24 hours) at 12/18/14  1122 Last data filed at 12/18/14 1100  Gross per 24 hour  Intake 2634.43 ml  Output   2415 ml  Net 219.43 ml    PHYSICAL EXAMINATION: General:  Acutely ill  Neuro:  Sedated on vent , RASS -2 HEENT:  Orally intubated. Neck large Cardiovascular:  s1 s2 RRB 54 HR Lungs:  Decreased throughout Abdomen:  Obese, abd closed, vac dressing intact  Musculoskeletal:  Intact  Skin:  Intact   LABS:  CBC  Recent Labs Lab 12/16/14 0430 12/17/14 0525 12/18/14 0430  WBC 13.1* 13.2* 12.9*  HGB 7.8* 8.2* 7.9*  HCT 24.9* 26.2* 24.9*  PLT 487* 535* 484*   Coag's No results for input(s): APTT, INR in the last 168 hours. BMET  Recent Labs Lab 12/16/14 0430 12/17/14 0525 12/18/14 0430  NA 142 137 138  K 3.6 3.8 4.2  CL 108 103 103  CO2 27 27 27   BUN 7 <5* 8  CREATININE 0.48 0.56 0.50  GLUCOSE 190* 200* 262*   Electrolytes  Recent Labs Lab 12/16/14 0430 12/17/14 0525 12/18/14 0430  CALCIUM 7.9* 7.6* 8.0*  MG 1.6* 1.7 2.1  PHOS 3.4 3.6 3.6   Sepsis Markers  Recent Labs Lab 12/12/14 0400  LATICACIDVEN 0.7   ABG No results for input(s): PHART, PCO2ART, PO2ART in the last 168 hours. Liver Enzymes  Recent Labs Lab 12/14/14 0405 12/16/14 0430  AST 19 30  ALT 12* 17  ALKPHOS 53 66  BILITOT 0.7 0.6  ALBUMIN 1.2* 1.3*   Cardiac Enzymes No results for  input(s): TROPONINI, PROBNP in the last 168 hours. Glucose  Recent Labs Lab 12/17/14 1119 12/17/14 1537 12/17/14 1946 12/17/14 2354 12/18/14 0422 12/18/14 0724  GLUCAP 251* 231* 236* 242* 275* 260*    Imaging Dg Chest Port 1 View  12/18/2014   CLINICAL DATA:  Acute respiratory failure.  EXAM: PORTABLE CHEST - 1 VIEW  COMPARISON:  12/17/2014, 12/16/2014 and 12/15/2014  FINDINGS: Endotracheal tube, PICC, and NG tube all appear in good position. Heart size and vascularity are normal. Right lung is clear. Improved slight atelectasis at the left lung base.  IMPRESSION: Minimal residual atelectasis at the left  base.   Electronically Signed   By: Francene Boyers M.D.   On: 12/18/2014 08:28   Image reviewed  ASSESSMENT / PLAN:  PULMONARY OETT 6/17 >>  A: Acute respiratory failure in setting of shock Probable OSA  P:   No new ABG Neuro improved Wean aggressive cpap5 ps 5, goal 1 hr, assess abg, rsbi pcxr re assuring Neg to even  Balance Assess cough  CARDIOVASCULAR CVL 6/17 PICC >>> A:  Septic shock  in setting of peritonitis -resolved HTN Bigeminy in OR, resolved P:  MAP goal >60 Tele  RENAL A:   AKI, improving  NAG metabolic acidosis  P:   Even to neg balance kvo Chem in am   GASTROINTESTINAL A:   Ruptured appendix Peritonitis  Ischemic bowel Now s/p expl lap, appendectomy, SB resection, and placement of wound vac 6/17;  -abd closure 6/23 Protein calorie malnutrition P:  Wound care per surg  TNA per pharmacy  HEMATOLOGIC A:  Anemia  P:  Trend CBC [place] heparin   INFECTIOUS Peritoneal fluid 6/17 >> strep, e coli  Blood cx 6/18 >>  ng A:   Sepsis -Peritonitis in setting of ruptured appendix  Possible bacterial translocation d/t ischemic bowel  P:    invanz 6/15>>> vanco 6/18 >> 6/22  Add diflucan for persistent fever (given perforation.)  Rpt blood cx 6/24 >>  OR 6/17>>>rare e coli, moderate strep microaero  No changes in abx coverage, need to establish stop dates  ENDOCRINE A:   Type II DM Hyperglycemia  P:   q4h ssi    NEUROLOGIC A:   Acute encephalopathy - delerium Pain  P:   RASS goal: 0 Precedex, WUA upright  WUA daily   FAMILY  - Updates: no family at bedside  - Inter-disciplinary family meet or Palliative Care meeting due by: NA  The patient is critically ill with multiple organ systems failure and requires high complexity decision making for assessment and support, frequent evaluation and titration of therapies, application of advanced monitoring technologies and extensive interpretation of multiple databases. Critical Care  Time devoted to patient care services described in this note independent of APP time is 30 minutes.   Mcarthur Rossetti. Tyson Alias, MD, FACP Pgr: [phone] [facility]

## 2014-12-19 LAB — GLUCOSE, CAPILLARY
GLUCOSE-CAPILLARY: 176 mg/dL — AB (ref 65–99)
GLUCOSE-CAPILLARY: 181 mg/dL — AB (ref 65–99)
GLUCOSE-CAPILLARY: 247 mg/dL — AB (ref 65–99)
Glucose-Capillary: 175 mg/dL — ABNORMAL HIGH (ref 65–99)
Glucose-Capillary: 176 mg/dL — ABNORMAL HIGH (ref 65–99)
Glucose-Capillary: 180 mg/dL — ABNORMAL HIGH (ref 65–99)
Glucose-Capillary: 181 mg/dL — ABNORMAL HIGH (ref 65–99)

## 2014-12-19 LAB — BASIC METABOLIC PANEL
ANION GAP: 7 (ref 5–15)
BUN: 6 mg/dL (ref 6–20)
CALCIUM: 8 mg/dL — AB (ref 8.9–10.3)
CHLORIDE: 104 mmol/L (ref 101–111)
CO2: 29 mmol/L (ref 22–32)
CREATININE: 0.42 mg/dL — AB (ref 0.44–1.00)
GFR calc non Af Amer: >60 mL/min (ref >60)
Glucose, Bld: 189 mg/dL — ABNORMAL HIGH (ref 65–99)
POTASSIUM: 3.7 mmol/L (ref 3.5–5.1)
Sodium: 140 mmol/L (ref 135–145)

## 2014-12-19 LAB — CLOSTRIDIUM DIFFICILE BY PCR: Toxigenic C. Difficile by PCR: NEGATIVE

## 2014-12-19 LAB — MAGNESIUM: Magnesium: 1.9 mg/dL (ref 1.7–2.4)

## 2014-12-19 LAB — PHOSPHORUS: Phosphorus: 4.4 mg/dL (ref 2.5–4.6)

## 2014-12-19 MED ORDER — FUROSEMIDE 10 MG/ML IJ SOLN
20.0000 mg | Freq: Two times a day (BID) | INTRAMUSCULAR | Status: AC
  Administered 2014-12-19 – 2014-12-21 (×4): 20 mg via INTRAVENOUS
  Filled 2014-12-19 (×3): qty 2

## 2014-12-19 MED ORDER — POTASSIUM CHLORIDE 10 MEQ/50ML IV SOLN
10.0000 meq | INTRAVENOUS | Status: AC
  Administered 2014-12-19 (×4): 10 meq via INTRAVENOUS
  Filled 2014-12-19 (×4): qty 50

## 2014-12-19 MED ORDER — MAGNESIUM SULFATE 2 GM/50ML IV SOLN
2.0000 g | Freq: Once | INTRAVENOUS | Status: AC
  Administered 2014-12-19: 2 g via INTRAVENOUS
  Filled 2014-12-19: qty 50

## 2014-12-19 MED ORDER — TRACE MINERALS CR-CU-MN-SE-ZN 10-1000-500-60 MCG/ML IV SOLN
INTRAVENOUS | Status: AC
  Administered 2014-12-19: 18:00:00 via INTRAVENOUS
  Filled 2014-12-19: qty 1920

## 2014-12-19 MED ORDER — DEXAMETHASONE SODIUM PHOSPHATE 4 MG/ML IJ SOLN
6.0000 mg | Freq: Once | INTRAMUSCULAR | Status: AC
  Administered 2014-12-19: 6 mg via INTRAVENOUS
  Filled 2014-12-19 (×2): qty 1.5

## 2014-12-19 NOTE — Progress Notes (Signed)
[facility] NOTE  Pharmacy Consult for TPN Indication: Prolonged ileus, NPO x ~7d  Allergies  Allergen Reactions  . Sulfonamide Derivatives Anaphylaxis  . Aspirin Nausea And Vomiting  . Penicillins Hives  . Latex Rash    Patient Measurements: Height: _0  (160 cm) Weight: 296 lb 15 oz (134.69 kg) IBW/kg (Calculated) : 52.4 Adjusted Body Weight: 73kg  Vital Signs: Temp: 97.9 F (36.6 C) (06/26 0718) Temp Source: Oral (06/26 0718) BP: 131/57 mmHg (06/26 0800) Pulse Rate: 87 (06/26 0800) Intake/Output from previous day: 06/25 0701 - 06/26 0700 In: 2048.3 [I.V.:108.3; NG/GT:60; IV Piggyback:150; TPN:1700] Out: 2385 [Urine:1785; Emesis/NG output:400; Drains:200] Intake/Output from this shift: Total I/O In: 110 [NG/GT:30; TPN:80] Out: 400 [Urine:250; Emesis/NG output:100; Drains:50]  Labs:  Recent Labs  12/17/14 0525 12/18/14 0430  WBC 13.2* 12.9*  HGB 8.2* 7.9*  HCT 26.2* 24.9*  PLT 535* 484*     Recent Labs  12/16/14 1548 12/17/14 0525 12/18/14 0430 12/19/14 0440  NA  --  137 138 140  K  --  3.8 4.2 3.7  CL  --  103 103 104  CO2  --  _1 GLUCOSE  --  200* 262* 189*  BUN  --  <5* 8 6  CREATININE  --  0.56 0.50 0.42*  CALCIUM  --  7.6* 8.0* 8.0*  MG  --  1.7 2.1 1.9  PHOS  --  3.6 3.6 4.4  TRIG 992*  --   --   --    Estimated Creatinine Clearance: 109.5 mL/min (by C-G formula based on Cr of 0.42).    Recent Labs  12/19/14 0027 12/19/14 0425 12/19/14 0713  GLUCAP 181* 180* 181*    Insulin Requirements in the past 24 hours:  31 units SSI + 45 units regular insulin in TPN bag  Assessment: 64 yof admitted 12/08/14 with 2 night hx of perforated appendix. Pt placed NPO and originally tried to medically manage. Appeared to improve initially with abx but had more pain, emesis, no more bowel function, abdomen more tender, wbc increasing. Went for emergent exploration and found to have severe peritonitis + bowel ischemia,  underwent appendectomy and SBR. Transferred to ICU. Nutrition recommends TPN initiation if unable to extubate and initiate feeding within 7-10 days of intubation. Pharmacy consulted to initiate TPN on 12/15/14 due to anticipated prolonged ileus as bowel left in discontinuity. Pt NPO x ~7days inpatient  Surgeries/Procedures: 6/17:  Emergency exploration - appendectomy, SBR, abdominal wound vac 6/19: Ex-lap, SB anastomosis, possible abdomen closure (failed) 6/21: Abdominal vacuum change, partial abdomen closure 6/23: abdomen closure  GI: Prealbumin 9.2. Ruptured appendix, peritonitis, ischemic bowel s/p appendectomy/SBR/abdominal wound vac. Abdominal wall could not be closed with edema/dilation of SB. OGT in place. 352m/24h emesis/OG output, 122m24h drain output- both decreased. No bowel function yet - LBM 12/09/14. Anticipate post-op ileus as bowel left in discontinuity. Returned to OR 6/23 for abdomen closure >> back to ICU post-op. PMH of dysphagia. PPI  Endo: DM2 - Prior to starting TPN, her sugars were well controlled, though it appears she is insulin dependent at home. Her A1C on 12/07/2014 was 6. Insulin in TPN bag increased to 45 units yesterday, and when that bag started, CBGs improved to 166-189 (prior to that bag starting, only 15 units in TPN bag and CBGs were 236-262)  Lytes: patient did not receive any electrolyte repletion yesterday as all lytes were at goal. This morning, K 3.7 (goal >/=4 with ileus), Mg 1.9 (goal >/=2 with  ileus), phos 4.4, CorCa 10.2 (ca x phos product = 44)  Renal: AKI on admit secondary to dehydration. SCr now stable at 0.42, normalized CrCl~74. UOP ok 0.5 ml/kg/h  Pulm: extubated 6/25 PM. 4L [place]. Having some wheezing  Cards: BP soft-wnl, HR wnl. HLD, CAD. plavix  Hepatobil: LFTs/tbili/alk phos ok, TG 161>231>992 on 6/23. She is now off propofol and not receiving IVFE with TPN. Will continue to watch- next triglyceride check in the morning  Neuro: off sedation-  getting Dilaudid 16m IV q1h PRN for pain  ID: Ertapenem D#10 + Fluconazole D#3 for septic shock/peritonitis. WBC 12.9, afebrile last 24h  fluconazole 6/24>> Ertapenem 6/15>> Vanc 6/18>> 6/22  6/17 peritoneal fluid>> microaerophilic strep, Ecoli (no S) 6/18 BCx2>> ngf  Best Practices: scds, heparin [place] TPN Access: PICC placed 6/15 TPN start date: 12/15/14>>  Current Nutrition:  Clinimix E 5/15 @ 80 ml/h- provides 96g protein and 1363kcal NPO   Nutritional Goals:  1149-1300 kCal, 135-145 grams of protein per day per RD assessment on 6/22  Plan:  - Continue Clinimix E 5/15 at 80 ml/h (will not give fats for 1st 7 days in ICU on TPN- today is D#5 without fats, though patient received propofol for 2 of those days and trigs are now very elevated) - Will not be able to achieve protein goal with premixed TPN and hypocaloric goals- would anticipate goals to change now that patient is extubated. Will follow up any RD recommendations - Daily multivitamin + trace elements in TPN - Increase regular insulin to 50 units in TPN bag + continue SSI q4h and monitor CBGs - give KCl 17m runs IV x4 - give magnesium sulfate 1g IV x2 - TPN labs as ordered  Eshaal Duby D. Jhayla Podgorski, PharmD, BCPS Clinical Pharmacist Pager: [phone]/26/2016 8:41 AM

## 2014-12-19 NOTE — Plan of Care (Signed)
Problem: Problem: Respiratory Progression Goal: ABLE TO WEAN TO ROOM AIR Outcome: Not Progressing Pt still requiring Oxygen, no wean, needs agressive pulmonary toliet, pt is willing with assistance

## 2014-12-19 NOTE — Progress Notes (Signed)
PULMONARY / CRITICAL CARE MEDICINE   Name: Allison [name] MRN: [id number] DOB: 05-17-1961    ADMISSION DATE:  12/07/2014 CONSULTATION DATE:  6/17  REFERRING MD :  Dwain Sarna   CHIEF COMPLAINT:   Severe sepsis Assist w/ vent management   INITIAL PRESENTATION:   41 yof who was admitted 6/14  W/ 2 night h/o perforated appendicitis. She appeared to improve initially with abx and resuscitation Then on 6/17 she had more pain, emesis, no more bowel function and her abdomen was more tender. Her wbc was rising. She went for emergent exploration, found to have severe peritonitis as well as evidence of bowel ischemia. She underwent appendectomy, SB resection and was left in discontinuity. Returned to the ICU on vent. PCCM asked to assist w/ care.   STUDIES:  CT abd/pelvis 6/15: Inflammatory process demonstrated throughout the pelvis with fluid and infiltration in the low pelvis and mesenteric. At appendicolith with abnormal appearing appendix, likely this represents perforated appendicitis. No discrete abscess is identified. There is small bowel wall thickening of the pelvic loops with proximal small bowel obstruction.  SIGNIFICANT EVENTS: Exp lap 6/17 6/19 reanastomosis, open abd vac 6/21 partial abd closure -vac abdo closed 6/25 extubated 6/26 pccm s/o   INTERVAL EVENTS / SUBJECTIVE:  Looks good s/p extubation. Does have some upper airway wheeze    VITAL SIGNS: Temp:  [97.9 F (36.6 C)-98 F (36.7 C)] 98 F (36.7 C) (06/26 1126) Pulse Rate:  [69-98] 86 (06/26 1300) Resp:  [12-29] 15 (06/26 1300) BP: (116-169)/(52-86) 157/71 mmHg (06/26 1200) SpO2:  [91 %-100 %] 100 % (06/26 1300) Weight:  [134.69 kg (296 lb 15 oz)] 134.69 kg (296 lb 15 oz) (06/26 0500) 4 liters HEMODYNAMICS:   VENTILATOR SETTINGS:   INTAKE / OUTPUT:  Intake/Output Summary (Last 24 hours) at 12/19/14 1313 Last data filed at 12/19/14 1300  Gross per 24 hour  Intake   2410 ml  Output   2855 ml  Net   -445  ml    PHYSICAL EXAMINATION: General:extubated. No distress.  Neuro:  Awake and oriented  HEENT:  Neck large, NGT in place. Upper airway wheeze  Cardiovascular:  s1 s2 RRB 54 HR Lungs:  Decreased throughout Abdomen:  Obese, abd closed, vac dressing intact  Musculoskeletal:  Intact  Skin:  Intact   LABS:  CBC  Recent Labs Lab 12/16/14 0430 12/17/14 0525 12/18/14 0430  WBC 13.1* 13.2* 12.9*  HGB 7.8* 8.2* 7.9*  HCT 24.9* 26.2* 24.9*  PLT 487* 535* 484*   Coag's No results for input(s): APTT, INR in the last 168 hours. BMET  Recent Labs Lab 12/17/14 0525 12/18/14 0430 12/19/14 0440  NA 137 138 140  K 3.8 4.2 3.7  CL 103 103 104  CO2 BUN <5* 8 6  CREATININE 0.56 0.50 0.42*  GLUCOSE 200* 262* 189*   Electrolytes  Recent Labs Lab 12/17/14 0525 12/18/14 0430 12/19/14 0440  CALCIUM 7.6* 8.0* 8.0*  MG 1.7 2.1 1.9  PHOS 3.6 3.6 4.4   Sepsis Markers No results for input(s): LATICACIDVEN, PROCALCITON, O2SATVEN in the last 168 hours. ABG No results for input(s): PHART, PCO2ART, PO2ART in the last 168 hours. Liver Enzymes  Recent Labs Lab 12/14/14 0405 12/16/14 0430  AST 19 30  ALT 12* 17  ALKPHOS 53 66  BILITOT 0.7 0.6  ALBUMIN 1.2* 1.3*   Cardiac Enzymes No results for input(s): TROPONINI, PROBNP in the last 168 hours. Glucose  Recent Labs Lab 12/18/14 1544 12/18/14  1953 12/19/14 0027 12/19/14 0425 12/19/14 0713 12/19/14 1125  GLUCAP 194* 166* 181* 180* 181* 175*    Imaging No results found. Image reviewed  ASSESSMENT / PLAN:  PULMONARY OETT 6/17 >> 6/26 A: Acute respiratory failure in setting of shock-->resolved  Probable OSA  pulm congestion residual likely P:  Neg to even  Balance pulm hygiene  Mobilize  Add humidified face tent for upper airway noises  CPAP at HS Lasix to neg balance further, had int prominence on pcxr, although small lung volumes  CARDIOVASCULAR CVL 6/17 PICC >>> A:  Septic shock  in  setting of peritonitis -resolved HTN Bigeminy in OR, resolved P:  MAP goal >60 Tele  RENAL A:   AKI, improving  NAG metabolic acidosis  P:   Even to neg balance kvo Chem in am   GASTROINTESTINAL A:   Ruptured appendix Peritonitis  Ischemic bowel Now s/p expl lap, appendectomy, SB resection, and placement of wound vac 6/17;  -abd closure 6/23 Protein calorie malnutrition P:  Wound care per surg  TNA per pharmacy  HEMATOLOGIC A:  Anemia  P:  Trend CBC [place] heparin  Transfuse for < 7  INFECTIOUS Peritoneal fluid 6/17 >> strep, e coli  Blood cx 6/18 >>  ng A:   Sepsis -Peritonitis in setting of ruptured appendix  Possible bacterial translocation d/t ischemic bowel  P:    invanz 6/15>>> vanco 6/18 >> 6/22  Added diflucan for persistent fever (given perforation.)  Rpt blood cx 6/24 >>  OR 6/17>>>rare e coli, moderate strep microaero No changes in abx coverage, need to establish stop dates  ENDOCRINE A:   Type II DM Hyperglycemia  P:   q4h ssi    NEUROLOGIC A:   Acute encephalopathy - delerium Pain  P:   Cont PRN analgesia    FAMILY  - Updates: no family at bedside  Looks good. Hemodynamically stable. At this point focus is on pulm hygiene, mobilization, wound care and ensuring nutritional goals. PCCM will sign off.   Simonne Martinet ACNP-BC [facility] Pager # [phone] OR # [phone] if no answer  STAFF NOTE: I, Rory Percy, MD FACP have personally reviewed patient's available data, including medical history, events of note, physical examination and test results as part of my evaluation. I have discussed with resident/NP and other care providers such as pharmacist, RN and RRT. In addition, I personally evaluated patient and elicited key findings of: some wheezing noted, edema?, lasix to neg balance, with lasix, supp mag, k, some HTN rising BP, ensure PRN sys control agent, consider transfer out of icu, will sign off, call  if needed  Mcarthur Rossetti. Tyson Alias, MD, FACP Pgr: [phone] [facility] 12/19/2014 2:14 PM

## 2014-12-19 NOTE — Progress Notes (Signed)
3 Days Post-Op  Subjective: Extubated and awake  Wheezing    Objective: Vital signs in last 24 hours: Temp:  [97.9 F (36.6 C)-99 F (37.2 C)] 97.9 F (36.6 C) (06/26 0718) Pulse Rate:  [54-98] 77 (06/26 0700) Resp:  [12-29] 12 (06/26 0700) BP: (116-169)/(52-91) 148/69 mmHg (06/26 0700) SpO2:  [91 %-100 %] 100 % (06/26 0700) FiO2 (%):  [40 %] 40 % (06/25 1130) Weight:  [134.69 kg (296 lb 15 oz)] 134.69 kg (296 lb 15 oz) (06/26 0500) Last BM Date: 12/18/14  Intake/Output from previous day: 06/25 0701 - 06/26 0700 In: 2048.3 [I.V.:108.3; NG/GT:60; IV Piggyback:150; TPN:1700] Out: 2385 [Urine:1785; Emesis/NG output:400; Drains:200] Intake/Output this shift:    Incision/Wound:vac in place clean  Soft Pulm:   Wheezes noted bilaterally  Lab Results:   Recent Labs  12/17/14 0525 12/18/14 0430  WBC 13.2* 12.9*  HGB 8.2* 7.9*  HCT 26.2* 24.9*  PLT 535* 484*   BMET  Recent Labs  12/18/14 0430 12/19/14 0440  NA 138 140  K 4.2 3.7  CL 103 104  CO2 27 29  GLUCOSE 262* 189*  BUN 8 6  CREATININE 0.50 0.42*  CALCIUM 8.0* 8.0*   PT/INR No results for input(s): LABPROT, INR in the last 72 hours. ABG No results for input(s): PHART, HCO3 in the last 72 hours.  Invalid input(s): PCO2, PO2  Studies/Results: Dg Chest Port 1 View  12/18/2014   CLINICAL DATA:  Acute respiratory failure.  EXAM: PORTABLE CHEST - 1 VIEW  COMPARISON:  12/17/2014, 12/16/2014 and 12/15/2014  FINDINGS: Endotracheal tube, PICC, and NG tube all appear in good position. Heart size and vascularity are normal. Right lung is clear. Improved slight atelectasis at the left lung base.  IMPRESSION: Minimal residual atelectasis at the left base.   Electronically Signed   By: Francene Boyers M.D.   On: 12/18/2014 08:28    Anti-infectives: Anti-infectives    Start     Dose/Rate Route Frequency Ordered Stop   12/17/14 1000  fluconazole (DIFLUCAN) IVPB 400 mg     400 mg 100 mL/hr over 120 Minutes Intravenous  Every 24 hours 12/17/14 0841     12/13/14 2200  vancomycin (VANCOCIN) IVPB 1000 mg/200 mL premix  Status:  Discontinued     1,000 mg 200 mL/hr over 60 Minutes Intravenous Every 8 hours 12/13/14 1429 12/15/14 0901   12/11/14 1400  vancomycin (VANCOCIN) IVPB 1000 mg/200 mL premix  Status:  Discontinued     1,000 mg 200 mL/hr over 60 Minutes Intravenous Every 12 hours 12/11/14 1300 12/13/14 1429   12/10/14 1700  vancomycin (VANCOCIN) 1,500 mg in sodium chloride 0.9 % 500 mL IVPB  Status:  Discontinued     1,500 mg 250 mL/hr over 120 Minutes Intravenous Every 24 hours 12/10/14 1601 12/11/14 1300   12/09/14 1100  ertapenem (INVANZ) 1 g in sodium chloride 0.9 % 50 mL IVPB  Status:  Discontinued    Comments:  She has hives from pcn before, the cross reactivity is low and I think there is some benefit to hopefully avoiding surgery in this patient without other good choices for abx, I think cipro/flagyl has too much resistance and she has perforation   1 g 100 mL/hr over 30 Minutes Intravenous Every 24 hours 12/09/14 0940 12/09/14 1017   12/09/14 1100  ertapenem (INVANZ) 1 g in sodium chloride 0.9 % 50 mL IVPB  Status:  Discontinued    Comments:  She has hives from pcn before, the cross reactivity  is low and I think there is some benefit to hopefully avoiding surgery in this patient without other good choices for abx, I think cipro/flagyl has too much resistance and she has perforation   1 g 100 mL/hr over 30 Minutes Intravenous Every 24 hours 12/09/14 1017 12/09/14 1018   12/09/14 1100  ertapenem (INVANZ) 1 g in sodium chloride 0.9 % 50 mL IVPB    Comments:  She has hives from pcn before, the cross reactivity is low and I think there is some benefit to hopefully avoiding surgery in this patient without other good choices for abx, I think cipro/flagyl has too much resistance and she has perforation   1 g 100 mL/hr over 30 Minutes Intravenous Every 24 hours 12/09/14 1020     12/09/14 0945  ertapenem  (INVANZ) injection 1 g  Status:  Discontinued    Comments:  She has hives from pcn before, the cross reactivity is low and I think there is some benefit to hopefully avoiding surgery in this patient without other good choices for abx, I think cipro/flagyl has too much resistance and she has perforation   1 g Intramuscular Every 24 hours 12/09/14 0937 12/09/14 0941   12/08/14 0800  ciprofloxacin (CIPRO) IVPB 400 mg  Status:  Discontinued     400 mg 200 mL/hr over 60 Minutes Intravenous Every 12 hours 12/08/14 0652 12/09/14 0933   12/08/14 0800  metroNIDAZOLE (FLAGYL) IVPB 500 mg  Status:  Discontinued     500 mg 100 mL/hr over 60 Minutes Intravenous Every 8 hours 12/08/14 0652 12/09/14 0933   12/08/14 0330  piperacillin-tazobactam (ZOSYN) IVPB 3.375 g     3.375 g 100 mL/hr over 30 Minutes Intravenous  Once 12/08/14 0328 12/08/14 0513      Assessment/Plan: s/p Procedure(s): VACUUM CHANGE  AND WOUND CLOSURE  (N/A) Open appendectomy  Extubated but with wheezes  Keep in ICU for pulmonary care toilet OOB Cont TNA and ABX DM 2 SSI    LOS: 11 days    Brianah Hopson A. 12/19/2014

## 2014-12-19 NOTE — Progress Notes (Signed)
Rt Note: Pt removed frpm Aerosol mask and placed on cpap at this time.  Rt will continue to monitor.

## 2014-12-19 NOTE — Evaluation (Signed)
Physical Therapy Evaluation Patient Details Name: Allison [name] MRN: [id number] DOB: [birth year] Today's Date: 12/19/2014   History of Present Illness    66 yof who was admitted 6/14 W/ 2 night h/o perforated appendicitis. She appeared to improve initially with abx and resuscitation Then on 6/17 she had more pain, emesis, no more bowel function and her abdomen was more tender. Her wbc was rising. She went for emergent exploration, found to have severe peritonitis as well as evidence of bowel ischemia. She underwent appendectomy, SB resection and was left in discontinuity. Returned to the ICU on vent.    Clinical Impression  Pt presents with severe limitations to functional mobility requiring 2 person assist for minimal activity. Plan to initiate PT in acute setting and based on current presentation, anticipate need for postacute PT, likely in SNF setting.  Will monitor improvement over time and update d/c recommendations if appropriate.  Continue to work with nursing for daily mobility needs as well. Thank you.    Follow Up Recommendations SNF    Equipment Recommendations  Rolling walker with 5" wheels (bariatric size)    Recommendations for Other Services       Precautions / Restrictions Precautions Precautions: Fall Precaution Comments: RW, up with assist      Mobility  Bed Mobility Overal bed mobility: + 2 for safety/equipment;+2 for physical assistance;Needs Assistance Bed Mobility: Supine to Sit     Supine to sit: +2 for physical assistance;+2 for safety/equipment;HOB elevated;Mod assist     General bed mobility comments: able to initiate movement; cues to roll vs. sit up to protect abdomen; uses rail with cues and assist to completely scoot hips to EOB  Transfers Overall transfer level: Needs assistance Equipment used: Rolling walker (2 wheeled);2 person hand held assist Transfers: Sit to/from Stand;Stand Pivot Transfers Sit to Stand: +2 physical assistance;+2  safety/equipment;Mod assist Stand pivot transfers: Min assist;+2 physical assistance;+2 safety/equipment       General transfer comment: stood x2, shakey/unsteady legs first trial, sat to recover then 2nd trial; pt self-encourages "move, move, move" and able to pivot to right with RW and >10 steps  Ambulation/Gait Ambulation/Gait assistance:  (deferred)              Stairs            Wheelchair Mobility    Modified Rankin (Stroke Patients Only)       Balance                                             Pertinent Vitals/Pain Pain Assessment: Faces Faces Pain Scale: Hurts even more Pain Location: abdomen Pain Intervention(s): Limited activity within patient's tolerance;Monitored during session;Premedicated before session;Repositioned    Home Living Family/patient expects to be discharged to:: Private residence Living Arrangements: Spouse/significant other Available Help at Discharge: Family;Available PRN/intermittently Type of Home: House Home Access: Level entry     Home Layout: One level Home Equipment: Cane - single point      Prior Function Level of Independence: Independent with assistive device(s)         Comments: need to verify, pt is unreliable historian     Hand Dominance        Extremity/Trunk Assessment   Upper Extremity Assessment: Generalized weakness           Lower Extremity Assessment: Generalized weakness  Communication   Communication: No difficulties  Cognition Arousal/Alertness: Lethargic;Awake/alert (initially lethargic, then alert once moving) Behavior During Therapy: Flat affect (mildly; slow to process) Overall Cognitive Status: Impaired/Different from baseline Area of Impairment: Following commands       Following Commands: Follows multi-step commands with increased time       General Comments: slow to process and respond, mostly appropriate responses    General Comments  General comments (skin integrity, edema, etc.): multiple lines, including NG tube, oxygen mask, abd wound VAC, monitors and IV    Exercises        Assessment/Plan    PT Assessment Patient needs continued PT services  PT Diagnosis Generalized weakness   PT Problem List Obesity;Cardiopulmonary status limiting activity;Decreased knowledge of use of DME;Decreased cognition;Decreased mobility;Decreased activity tolerance;Decreased strength  PT Treatment Interventions Patient/family education;Therapeutic exercise;Therapeutic activities;Functional mobility training;Gait training;DME instruction   PT Goals (Current goals can be found in the Care Plan section) Acute Rehab PT Goals Patient Stated Goal: go home PT Goal Formulation: With patient Time For Goal Achievement: 01/02/15 Potential to Achieve Goals: Good    Frequency Min 3X/week   Barriers to discharge Decreased caregiver support home alone some of the time    Co-evaluation               End of Session Equipment Utilized During Treatment: Gait belt Activity Tolerance: Patient limited by fatigue Patient left: in chair;with call bell/phone within reach;with nursing/sitter in room Nurse Communication: Mobility status         Time: 1449-1535 PT Time Calculation (min) (ACUTE ONLY): 46 min   Charges:   PT Evaluation $Initial PT Evaluation Tier I: 1 Procedure PT Treatments $Therapeutic Activity: 23-37 mins   PT G Codes:        Dennis Bast 12/19/2014, 3:57 PM

## 2014-12-20 LAB — CBC
HEMATOCRIT: 26.8 % — AB (ref 36.0–46.0)
Hemoglobin: 8.3 g/dL — ABNORMAL LOW (ref 12.0–15.0)
MCH: 28.8 pg (ref 26.0–34.0)
MCHC: 31 g/dL (ref 30.0–36.0)
MCV: 93.1 fL (ref 78.0–100.0)
PLATELETS: 629 10*3/uL — AB (ref 150–400)
RBC: 2.88 MIL/uL — AB (ref 3.87–5.11)
RDW: 13.7 % (ref 11.5–15.5)
WBC: 12.3 10*3/uL — AB (ref 4.0–10.5)

## 2014-12-20 LAB — COMPREHENSIVE METABOLIC PANEL
ALBUMIN: 1.6 g/dL — AB (ref 3.5–5.0)
ALK PHOS: 84 U/L (ref 38–126)
ALT: 30 U/L (ref 14–54)
AST: 28 U/L (ref 15–41)
Anion gap: 7 (ref 5–15)
BILIRUBIN TOTAL: 0.2 mg/dL — AB (ref 0.3–1.2)
BUN: 9 mg/dL (ref 6–20)
CHLORIDE: 99 mmol/L — AB (ref 101–111)
CO2: 32 mmol/L (ref 22–32)
CREATININE: 0.56 mg/dL (ref 0.44–1.00)
Calcium: 8.7 mg/dL — ABNORMAL LOW (ref 8.9–10.3)
GFR calc Af Amer: >60 mL/min (ref >60)
Glucose, Bld: 288 mg/dL — ABNORMAL HIGH (ref 65–99)
Potassium: 4.3 mmol/L (ref 3.5–5.1)
Sodium: 138 mmol/L (ref 135–145)
Total Protein: 5.8 g/dL — ABNORMAL LOW (ref 6.5–8.1)

## 2014-12-20 LAB — DIFFERENTIAL
Basophils Absolute: 0 10*3/uL (ref 0.0–0.1)
Basophils Relative: 0 % (ref 0–1)
EOS ABS: 0 10*3/uL (ref 0.0–0.7)
Eosinophils Relative: 0 % (ref 0–5)
LYMPHS ABS: 1.2 10*3/uL (ref 0.7–4.0)
LYMPHS PCT: 10 % — AB (ref 12–46)
MONOS PCT: 4 % (ref 3–12)
Monocytes Absolute: 0.5 10*3/uL (ref 0.1–1.0)
NEUTROS ABS: 10.7 10*3/uL — AB (ref 1.7–7.7)
Neutrophils Relative %: 86 % — ABNORMAL HIGH (ref 43–77)

## 2014-12-20 LAB — TYPE AND SCREEN
ABO/RH(D): A POS
Antibody Screen: NEGATIVE
UNIT DIVISION: 0
Unit division: 0

## 2014-12-20 LAB — TRIGLYCERIDES: TRIGLYCERIDES: 288 mg/dL — AB (ref <150)

## 2014-12-20 LAB — GLUCOSE, CAPILLARY
Glucose-Capillary: 265 mg/dL — ABNORMAL HIGH (ref 65–99)
Glucose-Capillary: 255 mg/dL — ABNORMAL HIGH (ref 65–99)
Glucose-Capillary: 272 mg/dL — ABNORMAL HIGH (ref 65–99)
Glucose-Capillary: 227 mg/dL — ABNORMAL HIGH (ref 65–99)
Glucose-Capillary: 184 mg/dL — ABNORMAL HIGH (ref 65–99)

## 2014-12-20 LAB — MAGNESIUM: MAGNESIUM: 1.9 mg/dL (ref 1.7–2.4)

## 2014-12-20 LAB — PREALBUMIN: Prealbumin: 13.9 mg/dL — ABNORMAL LOW (ref 18–38)

## 2014-12-20 LAB — PHOSPHORUS: Phosphorus: 4.8 mg/dL — ABNORMAL HIGH (ref 2.5–4.6)

## 2014-12-20 MED ORDER — MAGNESIUM SULFATE 2 GM/50ML IV SOLN
2.0000 g | Freq: Once | INTRAVENOUS | Status: AC
  Administered 2014-12-20: 2 g via INTRAVENOUS
  Filled 2014-12-20: qty 50

## 2014-12-20 MED ORDER — M.V.I. ADULT IV INJ
INJECTION | INTRAVENOUS | Status: AC
  Administered 2014-12-20: 18:00:00 via INTRAVENOUS
  Filled 2014-12-20: qty 1920

## 2014-12-20 NOTE — Progress Notes (Signed)
ANTIBIOTIC CONSULT NOTE - INITIAL  Pharmacy Consult for fluconazole Indication: empiric for continued fevers  Allergies  Allergen Reactions  . Sulfonamide Derivatives Anaphylaxis  . Aspirin Nausea And Vomiting  . Penicillins Hives  . Latex Rash    Patient Measurements: Height:  (160 cm) Weight: 289 lb 7.4 oz (131.3 kg) IBW/kg (Calculated) : 52.4  Vital Signs: Temp: 97.9 F (36.6 C) (06/27 0734) Temp Source: Oral (06/27 0734) BP: 158/60 mmHg (06/27 1000) Pulse Rate: 76 (06/27 1000) Intake/Output from previous day: 06/26 0701 - 06/27 0700 In: 2550 [I.V.:10; NG/GT:120; IV Piggyback:500; TPN:1920] Out: 6370 [Urine:5720; Emesis/NG output:500; Drains:150] Intake/Output from this shift: Total I/O In: 520 [NG/GT:30; IV Piggyback:250; TPN:240] Out: 695 [Urine:595; Emesis/NG output:100]  Labs:  Recent Labs  12/18/14 0430 12/19/14 0440 12/20/14 0410  WBC 12.9*  --  12.3*  HGB 7.9*  --  8.3*  PLT 484*  --  629*  CREATININE 0.50 0.42* 0.56   Estimated Creatinine Clearance: 107.8 mL/min (by C-G formula based on Cr of 0.56). No results for input(s): VANCOTROUGH, VANCOPEAK, VANCORANDOM, GENTTROUGH, GENTPEAK, GENTRANDOM, TOBRATROUGH, TOBRAPEAK, TOBRARND, AMIKACINPEAK, AMIKACINTROU, AMIKACIN in the last 72 hours.   Microbiology: Recent Results (from the past 720 hour(s))  Anaerobic culture     Status: None   Collection Time: 12/10/14  1:52 PM  Result Value Ref Range Status   Specimen Description FLUID PERITONEAL  Final   Special Requests ON A SWAB  Final   Gram Stain   Final    MODERATE WBC PRESENT,BOTH PMN AND MONONUCLEAR FEW GRAM POSITIVE COCCI IN PAIRS RARE GRAM POSITIVE COCCI IN CHAINS Results Called toChrisandra Carota [phone] [address] CONFIRMED BY K WILDER    Culture NO ANAEROBES ISOLATED  Final   Report Status 12/16/2014 FINAL  Final  Body fluid culture     Status: None   Collection Time: 12/10/14  1:52 PM  Result Value Ref Range Status   Specimen  Description FLUID PERITONEAL  Final   Special Requests ON A SWAB  Final   Gram Stain   Final    MODERATE WBC PRESENT,BOTH PMN AND MONONUCLEAR RARE GRAM POSITIVE COCCI IN PAIRS Results Called toChrisandra Carota [phone] WILDERK    Culture   Final    RARE ESCHERICHIA COLI MODERATE MICROAEROPHILIC STREPTOCOCCI Standardized susceptibility testing for this organism is not available. Processing error. Culture reordered on new accession    Report Status 12/14/2014 FINAL  Final  Body fluid culture     Status: None   Collection Time: 12/10/14  1:52 PM  Result Value Ref Range Status   Specimen Description FLUID PERITONEAL ON A SWAB  Final   Special Requests NONE  Final   Gram Stain   Final    MODERATE WBC PRESENT,BOTH PMN AND MONONUCLEAR FEW GRAM POSITIVE COCCI IN PAIRS RARE GRAM POSITIVE COCCI IN CHAINS GRAM STAIN REVIEWED-AGREE WITH RESULT Chrisandra Carota [phone] WILDERK    Culture   Final    RARE ESCHERICHIA COLI MODERATE MICROAEROPHILIC STREPTOCOCCI Standardized susceptibility testing for this organism is not available.    Report Status 12/17/2014 FINAL  Final   Organism ID, Bacteria ESCHERICHIA COLI  Final      Susceptibility   Escherichia coli - MIC*    AMPICILLIN <=2 SENSITIVE Sensitive     CEFAZOLIN <=4 SENSITIVE Sensitive     CEFEPIME <=1 SENSITIVE Sensitive     CEFTAZIDIME <=1 SENSITIVE Sensitive     CEFTRIAXONE <=1 SENSITIVE Sensitive     CIPROFLOXACIN <=0.25 SENSITIVE Sensitive  GENTAMICIN <=1 SENSITIVE Sensitive     IMIPENEM <=0.25 SENSITIVE Sensitive     TRIMETH/SULFA <=20 SENSITIVE Sensitive     AMPICILLIN/SULBACTAM <=2 SENSITIVE Sensitive     PIP/TAZO <=4 SENSITIVE Sensitive     * RARE ESCHERICHIA COLI  MRSA PCR Screening     Status: None   Collection Time: 12/10/14  9:43 PM  Result Value Ref Range Status   MRSA by PCR NEGATIVE NEGATIVE Final    Comment:        The GeneXpert MRSA Assay (FDA approved for NASAL specimens only), is one component of  a comprehensive MRSA colonization surveillance program. It is not intended to diagnose MRSA infection nor to guide or monitor treatment for MRSA infections.   Culture, blood (routine x 2)     Status: None   Collection Time: 12/11/14 10:49 AM  Result Value Ref Range Status   Specimen Description BLOOD LEFT ARM  Final   Special Requests BOTTLES DRAWN AEROBIC ONLY 10CC  Final   Culture NO GROWTH 5 DAYS  Final   Report Status 12/16/2014 FINAL  Final  Culture, blood (routine x 2)     Status: None   Collection Time: 12/11/14 10:55 AM  Result Value Ref Range Status   Specimen Description BLOOD LEFT ARM  Final   Special Requests BOTTLES DRAWN AEROBIC ONLY 2CC  Final   Culture NO GROWTH 5 DAYS  Final   Report Status 12/16/2014 FINAL  Final  Culture, blood (routine x 2)     Status: None (Preliminary result)   Collection Time: 12/17/14 10:28 AM  Result Value Ref Range Status   Specimen Description BLOOD BLOOD LEFT HAND  Final   Special Requests   Final    BOTTLES DRAWN AEROBIC AND ANAEROBIC 10CC BLUE 5CC RED   Culture NO GROWTH 2 DAYS  Final   Report Status PENDING  Incomplete  Culture, blood (routine x 2)     Status: None (Preliminary result)   Collection Time: 12/17/14 10:43 AM  Result Value Ref Range Status   Specimen Description BLOOD BLOOD LEFT HAND  Final   Special Requests BOTTLES DRAWN AEROBIC AND ANAEROBIC 5CC  Final   Culture NO GROWTH 2 DAYS  Final   Report Status PENDING  Incomplete  Clostridium Difficile by PCR (not at [facility])     Status: None   Collection Time: 12/18/14  5:47 PM  Result Value Ref Range Status   C difficile by pcr NEGATIVE NEGATIVE Final   Medications:  Anti-infectives    Start     Dose/Rate Route Frequency Ordered Stop   12/17/14 1000  fluconazole (DIFLUCAN) IVPB 400 mg     400 mg 100 mL/hr over 120 Minutes Intravenous Every 24 hours 12/17/14 0841     12/13/14 2200  vancomycin (VANCOCIN) IVPB 1000 mg/200 mL premix  Status:  Discontinued     1,000  mg 200 mL/hr over 60 Minutes Intravenous Every 8 hours 12/13/14 1429 12/15/14 0901   12/11/14 1400  vancomycin (VANCOCIN) IVPB 1000 mg/200 mL premix  Status:  Discontinued     1,000 mg 200 mL/hr over 60 Minutes Intravenous Every 12 hours 12/11/14 1300 12/13/14 1429   12/10/14 1700  vancomycin (VANCOCIN) 1,500 mg in sodium chloride 0.9 % 500 mL IVPB  Status:  Discontinued     1,500 mg 250 mL/hr over 120 Minutes Intravenous Every 24 hours 12/10/14 1601 12/11/14 1300   12/09/14 1100  ertapenem (INVANZ) 1 g in sodium chloride 0.9 % 50 mL IVPB  Status:  Discontinued    Comments:  She has hives from pcn before, the cross reactivity is low and I think there is some benefit to hopefully avoiding surgery in this patient without other good choices for abx, I think cipro/flagyl has too much resistance and she has perforation   1 g 100 mL/hr over 30 Minutes Intravenous Every 24 hours 12/09/14 0940 12/09/14 1017   12/09/14 1100  ertapenem (INVANZ) 1 g in sodium chloride 0.9 % 50 mL IVPB  Status:  Discontinued    Comments:  She has hives from pcn before, the cross reactivity is low and I think there is some benefit to hopefully avoiding surgery in this patient without other good choices for abx, I think cipro/flagyl has too much resistance and she has perforation   1 g 100 mL/hr over 30 Minutes Intravenous Every 24 hours 12/09/14 1017 12/09/14 1018   12/09/14 1100  ertapenem (INVANZ) 1 g in sodium chloride 0.9 % 50 mL IVPB    Comments:  She has hives from pcn before, the cross reactivity is low and I think there is some benefit to hopefully avoiding surgery in this patient without other good choices for abx, I think cipro/flagyl has too much resistance and she has perforation   1 g 100 mL/hr over 30 Minutes Intravenous Every 24 hours 12/09/14 1020     12/09/14 0945  ertapenem (INVANZ) injection 1 g  Status:  Discontinued    Comments:  She has hives from pcn before, the cross reactivity is low and I think  there is some benefit to hopefully avoiding surgery in this patient without other good choices for abx, I think cipro/flagyl has too much resistance and she has perforation   1 g Intramuscular Every 24 hours 12/09/14 0937 12/09/14 0941   12/08/14 0800  ciprofloxacin (CIPRO) IVPB 400 mg  Status:  Discontinued     400 mg 200 mL/hr over 60 Minutes Intravenous Every 12 hours 12/08/14 0652 12/09/14 0933   12/08/14 0800  metroNIDAZOLE (FLAGYL) IVPB 500 mg  Status:  Discontinued     500 mg 100 mL/hr over 60 Minutes Intravenous Every 8 hours 12/08/14 0652 12/09/14 0933   12/08/14 0330  piperacillin-tazobactam (ZOSYN) IVPB 3.375 g     3.375 g 100 mL/hr over 30 Minutes Intravenous  Once 12/08/14 0328 12/08/14 0513     Assessment: 54 yof initially admitted on 12/07/2014 with abdominal pain and found to have a ruptured appendix. Started on broad-spectrum antibiotics and she continues on ertapenem day #12. With continued fevers, she was also started on fluconazole empirically (day #4). Patient remains afebrile, WBC stable at 12.3, SCr stable and wnl, LFTs wnl.   Vanc 6/17 (no LD given) >> 6/22 Erta 6/16 >> Cipro 6/15 >> 6/16 Flagyl 6/15 >> 6/16 Zosyn 6/15 x 1 Flucon 6/24>>  6/26 Cdiff - NEG  6/24 Blood - NGTD  6/18 BCx >>NEG 6/17 Peritoneal fluid cx >> GS showing GPC in pairs >> rare ecoli, moderate microaerophilic strep  Goal of Therapy:  Eradication of infection  Plan:  - Continue Fluconazole 400mg  IV Q24H - Monitor renal fxn, C&S, clinical status  - F/u LOT plans  Camillo Quadros K. Bonnye Fava, PharmD, BCPS Clinical Pharmacist - Resident Pager: [phone] Pharmacy: [phone] 12/20/2014 10:57 AM

## 2014-12-20 NOTE — Progress Notes (Signed)
4 Days Post-Op  Subjective: Patient awake, alert Mild SOB - less wheezing CCM has signed off   Objective: Vital signs in last 24 hours: Temp:  [97.6 F (36.4 C)-99.8 F (37.7 C)] 97.9 F (36.6 C) (06/27 0734) Pulse Rate:  [70-89] 77 (06/27 0759) Resp:  [10-18] 16 (06/27 0759) BP: (131-163)/(53-77) 153/57 mmHg (06/27 0700) SpO2:  [95 %-100 %] 100 % (06/27 0759) FiO2 (%):  [35 %] 35 % (06/27 0700) Weight:  [131.3 kg (289 lb 7.4 oz)] 131.3 kg (289 lb 7.4 oz) (06/27 0600) Last BM Date: 12/18/14  Intake/Output from previous day: 06/26 0701 - 06/27 0700 In: 2550 [I.V.:10; NG/GT:120; IV Piggyback:500; TPN:1920] Out: 6370 [Urine:5720; Emesis/NG output:500; Drains:150] Intake/Output this shift:    General appearance: alert, cooperative and no distress Resp: lungs - mild wheezing - seems to be more upper airway; minimal rhonchi Cardio: regular rate and rhythm, S1, S2 normal, no murmur, click, rub or gallop GI: obese; occasional bowel sounds Wound - VAC with good seal; sponge not completely covering subcutaneous fat  Lab Results:   Recent Labs  12/18/14 0430 12/20/14 0410  WBC 12.9* 12.3*  HGB 7.9* 8.3*  HCT 24.9* 26.8*  PLT 484* 629*   BMET  Recent Labs  12/19/14 0440 12/20/14 0410  NA 140 138  K 3.7 4.3  CL 104 99*  CO2 29 32  GLUCOSE 189* 288*  BUN 6 9  CREATININE 0.42* 0.56  CALCIUM 8.0* 8.7*   PT/INR No results for input(s): LABPROT, INR in the last 72 hours. ABG No results for input(s): PHART, HCO3 in the last 72 hours.  Invalid input(s): PCO2, PO2  Studies/Results: No results found.  Anti-infectives: Anti-infectives    Start     Dose/Rate Route Frequency Ordered Stop   12/17/14 1000  fluconazole (DIFLUCAN) IVPB 400 mg     400 mg 100 mL/hr over 120 Minutes Intravenous Every 24 hours 12/17/14 0841     12/13/14 2200  vancomycin (VANCOCIN) IVPB 1000 mg/200 mL premix  Status:  Discontinued     1,000 mg 200 mL/hr over 60 Minutes Intravenous Every 8  hours 12/13/14 1429 12/15/14 0901   12/11/14 1400  vancomycin (VANCOCIN) IVPB 1000 mg/200 mL premix  Status:  Discontinued     1,000 mg 200 mL/hr over 60 Minutes Intravenous Every 12 hours 12/11/14 1300 12/13/14 1429   12/10/14 1700  vancomycin (VANCOCIN) 1,500 mg in sodium chloride 0.9 % 500 mL IVPB  Status:  Discontinued     1,500 mg 250 mL/hr over 120 Minutes Intravenous Every 24 hours 12/10/14 1601 12/11/14 1300   12/09/14 1100  ertapenem (INVANZ) 1 g in sodium chloride 0.9 % 50 mL IVPB  Status:  Discontinued    Comments:  She has hives from pcn before, the cross reactivity is low and I think there is some benefit to hopefully avoiding surgery in this patient without other good choices for abx, I think cipro/flagyl has too much resistance and she has perforation   1 g 100 mL/hr over 30 Minutes Intravenous Every 24 hours 12/09/14 0940 12/09/14 1017   12/09/14 1100  ertapenem (INVANZ) 1 g in sodium chloride 0.9 % 50 mL IVPB  Status:  Discontinued    Comments:  She has hives from pcn before, the cross reactivity is low and I think there is some benefit to hopefully avoiding surgery in this patient without other good choices for abx, I think cipro/flagyl has too much resistance and she has perforation   1 g 100  mL/hr over 30 Minutes Intravenous Every 24 hours 12/09/14 1017 12/09/14 1018   12/09/14 1100  ertapenem (INVANZ) 1 g in sodium chloride 0.9 % 50 mL IVPB    Comments:  She has hives from pcn before, the cross reactivity is low and I think there is some benefit to hopefully avoiding surgery in this patient without other good choices for abx, I think cipro/flagyl has too much resistance and she has perforation   1 g 100 mL/hr over 30 Minutes Intravenous Every 24 hours 12/09/14 1020     12/09/14 0945  ertapenem (INVANZ) injection 1 g  Status:  Discontinued    Comments:  She has hives from pcn before, the cross reactivity is low and I think there is some benefit to hopefully avoiding surgery in  this patient without other good choices for abx, I think cipro/flagyl has too much resistance and she has perforation   1 g Intramuscular Every 24 hours 12/09/14 0937 12/09/14 0941   12/08/14 0800  ciprofloxacin (CIPRO) IVPB 400 mg  Status:  Discontinued     400 mg 200 mL/hr over 60 Minutes Intravenous Every 12 hours 12/08/14 0652 12/09/14 0933   12/08/14 0800  metroNIDAZOLE (FLAGYL) IVPB 500 mg  Status:  Discontinued     500 mg 100 mL/hr over 60 Minutes Intravenous Every 8 hours 12/08/14 0652 12/09/14 0933   12/08/14 0330  piperacillin-tazobactam (ZOSYN) IVPB 3.375 g     3.375 g 100 mL/hr over 30 Minutes Intravenous  Once 12/08/14 0328 12/08/14 0513      Assessment/Plan: s/p Procedure(s): VACUUM CHANGE  AND WOUND CLOSURE  (N/A) s/p open appendectomy with peritonitis - open abdomen for several days; now closed  On TNA for nutrition Small BM two days ago; no BM yesterday - passing some flatus Will clamp NG tube - if no nausea; continued bowel function, will remove tomorrow VAC change today - larger sponge to cover all subcutaneous tissue Transfer to step-down unit for continued pulmonary care Physical therapy is involved with mobilizing patient.  LOS: 12 days    Daryll Spisak K. 12/20/2014

## 2014-12-20 NOTE — Consult Note (Addendum)
WOC wound follow up CCS following for assessment and plan of care to abd wound.  PA Megan at bedside to assess wound during dressing change. Wound type:  Full thickness post-op wound Measurement:20X5X5cm Wound bed: 100% beefy red Drainage (amount, consistency, odor) Mod amt pink drainage in the cannister, no odor Periwound: Intact skin surrounding Dressing procedure/placement/frequency: Applied one piece of black foam over a layer of Mepitel contact dressing to protect wound bed.  Pt medicated for pain prior to procedure and tolerated with mod amt discomfort.  Plan for bedside nurses to change Q M/W/F. Please re-consult if further assistance is needed.  Thank-you,  Cammie Mcgeeawn Dashiel Bergquist MSN, RN, CWOCN, GideonWCN-AP, CNS [phone]

## 2014-12-20 NOTE — Plan of Care (Signed)
Problem: Phase I Progression Outcomes Goal: Initial discharge plan identified Outcome: Completed/Met Date Met:  12/20/14 Plan for STR at d/c

## 2014-12-20 NOTE — Care Management Note (Signed)
Case Management Note  Patient Details  Name: Allison SallesRobin M [name] MRN: [id number] Date of Birth: 01/26/1961  Subjective/Objective:     Lives at home with husband and their dogs.  Nods head to understanding that she will need rehab prior to going home.  SW consult placed.                Action/Plan:   Expected Discharge Date:  12/15/14               Expected Discharge Plan:  Home w Home Health Services  In-House Referral:     Discharge planning Services     Post Acute Care Choice:    Choice offered to:     DME Arranged:    DME Agency:     HH Arranged:    HH Agency:     Status of Service:  In process, will continue to follow  Medicare Important Message Given:    Date Medicare IM Given:    Medicare IM give by:    Date Additional Medicare IM Given:    Additional Medicare Important Message give by:     If discussed at Long Length of Stay Meetings, dates discussed:    Additional Comments:  Vangie BickerBrown, Aj Crunkleton Jane, RN 12/20/2014, 2:46 PM

## 2014-12-20 NOTE — Progress Notes (Signed)
[facility] NOTE  Pharmacy Consult for TPN Indication: Prolonged ileus, NPO x ~7d  Allergies  Allergen Reactions  . Sulfonamide Derivatives Anaphylaxis  . Aspirin Nausea And Vomiting  . Penicillins Hives  . Latex Rash    Patient Measurements: Height: 5' 3" (160 cm) Weight: 289 lb 7.4 oz (131.3 kg) IBW/kg (Calculated) : 52.4 Adjusted Body Weight: 73kg  Vital Signs: Temp: 97.9 F (36.6 C) (06/27 0734) Temp Source: Oral (06/27 0734) BP: 155/59 mmHg (06/27 0800) Pulse Rate: 76 (06/27 0800) Intake/Output from previous day: 06/26 0701 - 06/27 0700 In: 2550 [I.V.:10; NG/GT:120; IV Piggyback:500; TPN:1920] Out: 9476 [Urine:5720; Emesis/NG output:500; Drains:150] Intake/Output from this shift: Total I/O In: 80 [TPN:80] Out: 425 [Urine:325; Emesis/NG output:100]  Labs:  Recent Labs  12/18/14 0430 12/20/14 0410  WBC 12.9* 12.3*  HGB 7.9* 8.3*  HCT 24.9* 26.8*  PLT 484* 629*     Recent Labs  12/18/14 0430 12/19/14 0440 12/20/14 0410  NA 138 140 138  K 4.2 3.7 4.3  CL 103 104 99*  CO2 27 29 32  GLUCOSE 262* 189* 288*  BUN _0 CREATININE 0.50 0.42* 0.56  CALCIUM 8.0* 8.0* 8.7*  MG 2.1 1.9 1.9  PHOS 3.6 4.4 4.8*  PROT  --   --  5.8*  ALBUMIN  --   --  1.6*  AST  --   --  28  ALT  --   --  30  ALKPHOS  --   --  84  BILITOT  --   --  0.2*  PREALBUMIN  --   --  13.9*  TRIG  --   --  288*   Estimated Creatinine Clearance: 107.8 mL/min (by C-G formula based on Cr of 0.56).    Recent Labs  12/19/14 1936 12/19/14 2339 12/20/14 0413  GLUCAP 176* 247* 265*    Insulin Requirements in the past 24 hours:  30 units SSI + 50 units regular insulin in TPN bag  Assessment: Allison [name] admitted 12/08/14 with 2 night hx of perforated appendix. Pt placed NPO and originally tried to medically manage. Appeared to improve initially with abx but had more pain, emesis, no more bowel function, abdomen more tender, wbc increasing. Went for emergent  exploration and found to have severe peritonitis + bowel ischemia, underwent appendectomy and SBR. Transferred to ICU. Nutrition recommends TPN initiation if unable to extubate and initiate feeding within 7-10 days of intubation. Pharmacy consulted to initiate TPN on 12/15/14 due to anticipated prolonged ileus as bowel left in discontinuity. Pt NPO x ~7days inpatient.  Surgeries/Procedures: 6/17:  Emergency exploration - appendectomy, SBR, abdominal wound vac 6/19: Ex-lap, SB anastomosis, possible abdomen closure (failed) 6/21: Abdominal vacuum change, partial abdomen closure 6/23: abdomen closure  GI: Prealbumin 9.2 >> 13.9. Ruptured appendix, peritonitis, ischemic bowel s/p appendectomy/SBR/abdominal wound vac. Abdominal wall could not be closed with edema/dilation of SB. Anticipate post-op ileus as bowel left in discontinuity and TPN started. Returned to OR 6/23 for abdomen closure >> back to ICU post-op. 527m/24h emesis/OG output, 2011m24h drain output- both increased. Small BM 2 days ago, none yesterday, +BS - to clamp NG today, remove tomorrow if no N, continued bowel function. PMH of dysphagia. PPI. To transfer to stepdown today  Endo: DM2 - Prior to starting TPN, her sugars were well controlled, though it appears she is insulin dependent at home. Her A1C on 12/07/2014 was 6. CBGs 175-288 (S/p decadron IV 16m101m 1 dose - TPN bag with insulin increased  to 50 units + SSI)  Lytes: K 4.3 s/p 4 k runs yesterday (goal >/=4 with ileus), Mg stable 1.9 s/p Mg sulfate 2g yesterday (goal >/=2 with ileus), phos up to 4.8 (no phos binders), CorCa up 10.6 (ca x phos product = 50.1)  Renal: AKI on admit secondary to dehydration. SCr now stable at 0.42, normalized CrCl~74. UOP good 1.8 ml/kg/h - now on lasix 60m IV q12h  Pulm: extubated 6/25 PM to [place]  Cards: BP slightly elevated-wnl, HR wnl. HLD, CAD. plavix  Hepatobil: LFTs/tbili/alk phos ok, TG now improved 161>231>992>288. Off propofol and not  receiving IVFE with TPN  Neuro: off sedation- getting Dilaudid 151mIV q2h PRN for pain  ID: Ertapenem D#11 + Fluconazole D#4 for septic shock/peritonitis. WBC down to 12.3, afebrile last 24h  fluconazole 6/24>> Ertapenem 6/15>> Vanc 6/18>> 6/22  6/17 peritoneal fluid>> microaerophilic strep, Ecoli (pan-S) 6/18 BCx2>> ngf 6/25 cdiff: neg 6/24 BCx2>>pending  Best Practices: scds, heparin [place] TPN Access: PICC placed 6/15 TPN start date: 12/15/14>>  Current Nutrition:  Clinimix E 5/15 @ 80 ml/h- provides 96g protein and 1363kcal NPO   Nutritional Goals:  1149-1300 kCal, 135-145 grams of protein per day per RD assessment on 6/22  Plan:  - Continue Clinimix E 5/15 at 80 ml/h (will not give fats for 1st 7 days in ICU on TPN- today is D#6 without fats, though patient received propofol for 2 of those days and TG are now improving) - Will leave electrolytes in bag today and monitor Ca x Phos product - Will not be able to achieve protein goal with premixed TPN and hypocaloric goals- would anticipate goals to change now that patient is extubated. Will follow up any RD recommendations - Daily multivitamin + trace elements in TPN - Increase regular insulin to 60 units in TPN bag + continue SSI q4h and monitor CBGs - Give magnesium sulfate 2g IV x1 - F/u AM labs  HaElicia LampPharmD Clinical Pharmacist - Resident Pager [phone]/27/2016 8:38 AM

## 2014-12-20 NOTE — Clinical Social Work Note (Signed)
CSW received consult for patient who may need SNF for placement.  CSW attempted to assess patient, but patient was sleeping and did not want to be disturbed, CSW will attempt to see patient tomorrow to complete assessment.  Ervin KnackEric R. Zaida Reiland, MSW, Theresia MajorsLCSWA [phone] 12/20/2014 5:15 PM

## 2014-12-21 LAB — GLUCOSE, CAPILLARY
GLUCOSE-CAPILLARY: 166 mg/dL — AB (ref 65–99)
Glucose-Capillary: 158 mg/dL — ABNORMAL HIGH (ref 65–99)
Glucose-Capillary: 175 mg/dL — ABNORMAL HIGH (ref 65–99)
Glucose-Capillary: 153 mg/dL — ABNORMAL HIGH (ref 65–99)
Glucose-Capillary: 157 mg/dL — ABNORMAL HIGH (ref 65–99)
Glucose-Capillary: 159 mg/dL — ABNORMAL HIGH (ref 65–99)
Glucose-Capillary: 183 mg/dL — ABNORMAL HIGH (ref 65–99)

## 2014-12-21 LAB — BASIC METABOLIC PANEL
ANION GAP: 9 (ref 5–15)
BUN: 13 mg/dL (ref 6–20)
CALCIUM: 8.4 mg/dL — AB (ref 8.9–10.3)
CO2: 33 mmol/L — ABNORMAL HIGH (ref 22–32)
Chloride: 97 mmol/L — ABNORMAL LOW (ref 101–111)
Creatinine, Ser: 0.59 mg/dL (ref 0.44–1.00)
GFR calc Af Amer: >60 mL/min (ref >60)
Glucose, Bld: 173 mg/dL — ABNORMAL HIGH (ref 65–99)
Potassium: 3.4 mmol/L — ABNORMAL LOW (ref 3.5–5.1)
SODIUM: 139 mmol/L (ref 135–145)

## 2014-12-21 LAB — MAGNESIUM: Magnesium: 1.8 mg/dL (ref 1.7–2.4)

## 2014-12-21 LAB — PHOSPHORUS: PHOSPHORUS: 4.4 mg/dL (ref 2.5–4.6)

## 2014-12-21 MED ORDER — TRACE MINERALS CR-CU-MN-SE-ZN 10-1000-500-60 MCG/ML IV SOLN
INTRAVENOUS | Status: AC
  Administered 2014-12-21: 18:00:00 via INTRAVENOUS
  Filled 2014-12-21: qty 1920

## 2014-12-21 MED ORDER — TRAMADOL HCL 50 MG PO TABS
50.0000 mg | ORAL_TABLET | Freq: Four times a day (QID) | ORAL | Status: DC | PRN
  Administered 2014-12-22: 100 mg via ORAL
  Administered 2014-12-22: 50 mg via ORAL
  Administered 2014-12-23 – 2014-12-25 (×4): 100 mg via ORAL
  Filled 2014-12-21 (×4): qty 2
  Filled 2014-12-21: qty 1
  Filled 2014-12-21: qty 2

## 2014-12-21 MED ORDER — SODIUM CHLORIDE 0.9 % IJ SOLN
10.0000 mL | INTRAMUSCULAR | Status: DC | PRN
  Administered 2014-12-24 – 2014-12-25 (×2): 10 mL
  Filled 2014-12-21 (×2): qty 40

## 2014-12-21 MED ORDER — POTASSIUM CHLORIDE 10 MEQ/100ML IV SOLN
10.0000 meq | INTRAVENOUS | Status: AC
  Administered 2014-12-21 (×4): 10 meq via INTRAVENOUS
  Filled 2014-12-21 (×4): qty 100

## 2014-12-21 MED ORDER — MAGNESIUM SULFATE 50 % IJ SOLN
3.0000 g | Freq: Once | INTRAVENOUS | Status: AC
  Administered 2014-12-21: 3 g via INTRAVENOUS
  Filled 2014-12-21: qty 6

## 2014-12-21 MED ORDER — BOOST / RESOURCE BREEZE PO LIQD
1.0000 | Freq: Two times a day (BID) | ORAL | Status: DC
  Administered 2014-12-22 – 2014-12-25 (×6): 1 via ORAL

## 2014-12-21 MED ORDER — FENTANYL CITRATE (PF) 100 MCG/2ML IJ SOLN
12.5000 ug | Freq: Four times a day (QID) | INTRAMUSCULAR | Status: DC | PRN
  Administered 2014-12-22 – 2014-12-23 (×4): 25 ug via INTRAVENOUS
  Filled 2014-12-21 (×4): qty 2

## 2014-12-21 MED ORDER — SODIUM CHLORIDE 0.9 % IJ SOLN
10.0000 mL | Freq: Two times a day (BID) | INTRAMUSCULAR | Status: DC
  Administered 2014-12-21 – 2014-12-25 (×5): 10 mL

## 2014-12-21 NOTE — Progress Notes (Signed)
While conducting neuro assessment, patient showed differing pupil sizes from what was seen earlier in the day by previous RN. Right pupil was sluggish and measured 3mm while the Left eye was brisk and measured 4mm. CCS was paged. Dr. Derrell Lollingamirez returned the page. No new orders given. Will continue to monitor the patient.

## 2014-12-21 NOTE — Progress Notes (Signed)
Pt unable to tolerate CPAP. Pt has NG tube and she complained her nose is very sore. When I attempted to put the mask on she pushed me away and said it caused to much pressure. RN says the NG should be taken out tomorrow. RN/RT will monitor tonight and attempt tomorrow night.

## 2014-12-21 NOTE — Progress Notes (Signed)
Physical Therapy Treatment Patient Details Name: Allison [name] MRN: [id number] DOB: [birth year] Today's Date: 12/21/2014    History of Present Illness 37 yof who was admitted 6/14with 2 night h/o perforated appendicitis. She appeared to improve initially with abx and resuscitation.  Then on 6/17 she had more pain and went for emergent exploration, found to have severe peritonitis and evidence of bowel ischemia. She underwent appendectomy, SB resection.  6/19 reanastomosis, open abd vac; 6/21 partial abd closure -vac 6/25 extubated PMHx- bipolar/depression, DM, hypotension    PT Comments    Pt progressing slowly (2 person assist to ambulate, for safety) with very flexed posture and extremely small steps. She reports she assisted her husband with his ADLs "at times" PTA and he cannot provide assist to her. She would have to be independent to return home and agrees she needs further rehab.   Follow Up Recommendations  SNF     Equipment Recommendations  Rolling walker with 5" wheels (bariatric size)    Recommendations for Other Services       Precautions / Restrictions Precautions Precautions: Fall    Mobility  Bed Mobility Overal bed mobility: + 2 for safety/equipment;Needs Assistance Bed Mobility: Rolling;Sidelying to Sit Rolling: Min assist Sidelying to sit: Min assist       General bed mobility comments: cues to roll, uses rail with cues, initiates side to sit with cues,   Transfers Overall transfer level: Needs assistance Equipment used: Rolling walker (2 wheeled) Transfers: Sit to/from [employer] Sit to Stand: +2 physical assistance;+2 safety/equipment;Min assist;Min guard Stand pivot transfers: Min assist;+2 physical assistance;+2 safety/equipment       General transfer comment: stood x3 with cues needed each time. Appears pt is accustomed to putting hands on RW to get up.   Ambulation/Gait Ambulation/Gait assistance: Min assist;+2  safety/equipment Ambulation Distance (Feet): 6 Feet Assistive device: Rolling walker (2 wheeled) Gait Pattern/deviations: Step-through pattern;Decreased stride length;Shuffle;Trunk flexed   Gait velocity interpretation: Below normal speed for age/gender General Gait Details: VERY short steps (to go 6 ft, at least 50 steps); flexed posture due to abd pain and did not correct with multimodal cues   Stairs            Wheelchair Mobility    Modified Rankin (Stroke Patients Only)       Balance Overall balance assessment: Needs assistance Sitting-balance support: No upper extremity supported;Feet unsupported Sitting balance-Leahy Scale: Good     Standing balance support: Bilateral upper extremity supported Standing balance-Leahy Scale: Poor Standing balance comment: heavy reliance on UEs on RW (even with static standing) due to flexed posture due to abd pain                    Cognition Arousal/Alertness: Awake/alert Behavior During Therapy: Flat affect (slow to process) Overall Cognitive Status: Impaired/Different from baseline Area of Impairment: Following commands;Orientation;Attention;Safety/judgement;Awareness;Problem solving Orientation Level: Time Current Attention Level: Sustained   Following Commands: Follows multi-step commands with increased time Safety/Judgement: Decreased awareness of safety;Decreased awareness of deficits Awareness: Intellectual Problem Solving: Slow processing;Decreased initiation;Difficulty sequencing;Requires verbal cues;Requires tactile cues General Comments: slow to process and respond; requires cues to place hands safely during transfers (each time!)    Exercises      General Comments        Pertinent Vitals/Pain On 6L portable O2 for walking with SaO2 96-100%  HR90s-100s  Pain Assessment: Faces Faces Pain Scale: Hurts little more Pain Location: abd Pain Intervention(s): Limited activity within patient's  tolerance;Monitored during  session;Repositioned    Home Living                      Prior Function            PT Goals (current goals can now be found in the care plan section) Acute Rehab PT Goals Patient Stated Goal: go home Time For Goal Achievement: 01/02/15 Progress towards PT goals: Progressing toward goals    Frequency  Min 3X/week    PT Plan Current plan remains appropriate    Co-evaluation             End of Session Equipment Utilized During Treatment: Gait belt;Oxygen Activity Tolerance: Patient limited by fatigue;Patient limited by pain Patient left: in chair;with call bell/phone within reach;with nursing/sitter in room     Time: 1128-1204 PT Time Calculation (min) (ACUTE ONLY): 36 min  Charges:  $Gait Training: 8-22 mins $Therapeutic Activity: 8-22 mins                    G Codes:      Allison [name] 12/21/2014, 12:52 PM Pager [phone]

## 2014-12-21 NOTE — Progress Notes (Addendum)
Was the fall witnessed: No   Patient condition before and after the fall: Prior to fall Confused/Stable; Post Fall Confused/Stable  Patient's reaction to the fall: None. No reports of pain beyond surgical pain that was present prior to the fall.   Name of the doctor that was notified including date and time: Barnetta ChapelKelly Osborne PA with [facility]  Any interventions and vital signs: Q1H CBG until PICC replaced. Floor Pads placed on either side of the bed. Bed connected to hillrom system call bell system.  Jacqulyn Canehristopher Scott Raven Furnas RN, BSN, CCRN    12/21/14 1508  What Happened: 14:56 called to patient room by unit secretary Kenney Housemanoris Dickerson who responded to calls for help from patient.   Jori Mollyan Wilkinson and myself arrived in the room with in seconds.  Patient found on her belly with no gross signs of injury.  She had pulled her PICC line out during the fall.  She had no marks on her and no new complaints of pain.  Patient returned to bed with use of sky lift.  Assessment stable per primary RN Jori Mollyan Wilkinson.  Barnetta ChapelKelly osborne notified, Dr. Corliss Skainssuei came to assess at bedside.  Orders for posey belt obtained and patient placed on sitter list.  Bedside huddle with Anthoney HaradaAllison Flehan Charge RN, myself, and Jori Mollyan WIlkinson.   Was fall witnessed? No  Was patient injured? Unsure  Patient found on floor  Found by Staff-comment (NS Kenney Housemanoris Dickerson heard patient and alerted Nursing staff t)  Stated prior activity other (comment) ("I dont know")  Follow Up  MD notified Maryelizabeth KaufmannKelley Osborne PA  Time MD notified [phone]  Adult Fall Risk Assessment  Risk Factor Category (scoring not indicated) Not Applicable  Age 54  Fall History: Fall within 6 months prior to admission 0  Elimination; Bowel and/or Urine Incontinence 0  Elimination; Bowel and/or Urine Urgency/Frequency 0  Medications: includes PCA/Opiates, Anti-convulsants, Anti-hypertensives, Diuretics, Hypnotics, Laxatives, Sedatives, and Psychotropics 0  Patient Care Equipment 3   Mobility-Assistance 2  Mobility-Gait 2  Mobility-Sensory Deficit 0  Cognition-Awareness 1  Cognition-Impulsiveness 2  Cognition-Limitations 4  Total Score 14  Patient's Fall Risk High Fall Risk (>13 points)  Adult Fall Risk Interventions  Required Bundle Interventions *See Row Information* High fall risk - low, moderate, and high requirements implemented  Additional Interventions Bed alarm not indicated with the bundle;PT/OT need assessed if change in mobility from baseline;Reorient/diversional activities with confused patients;Room near nurses station;Secure all tubes/drains  Fall with Injury Screening  Risk For Fall Injury- See Row Information  E  Intervention(s) for 2 or more risk criteria identified Floor Administrator, sportsMat;Safety Sitter Radiographer, therapeutic(Safety Sitter requested)  Vitals  Temp 98 F (36.7 C)  Temp Source Oral  Pulse Rate 79  Resp (!) 28  Oxygen Therapy  SpO2 99 %  O2 Device Nasal Cannula  O2 Flow Rate (L/min) 3 L/min  Pulse Oximetry Type Continuous  Pain Assessment  Pain Assessment 0-10  Pain Type Surgical pain  Pain Location Abdomen  Neurological  Neuro (WDL) X  Level of Consciousness Alert  Orientation Level Disoriented to place (Knew she was in the hospital, confused on city and which hos)  Cognition Poor attention/concentration;Poor judgement;Poor safety awareness;Follows commands;Impulsive  Speech Clear  Pupil Assessment  Yes  R Pupil Size (mm) 3  R Pupil Shape Round  R Pupil Reaction Brisk  L Pupil Size (mm) 3  L Pupil Shape Round  L Pupil Reaction Brisk  Additional Pupil Assessments No  Glasgow Coma Scale  Eye Opening 4  Best  Verbal Response (NON-intubated) 4  Best Motor Response 6  Glasgow Coma Scale Score 14  Integumentary  Integumentary (WDL) X  Skin Color  Appropriate for ethnicity

## 2014-12-21 NOTE — Progress Notes (Signed)
Peripherally Inserted Central Catheter/Midline Placement  The IV Nurse has discussed with the patient and/or persons authorized to consent for the patient, the purpose of this procedure and the potential benefits and risks involved with this procedure.  The benefits include less needle sticks, lab draws from the catheter and patient may be discharged home with the catheter.  Risks include, but not limited to, infection, bleeding, blood clot (thrombus formation), and puncture of an artery; nerve damage and irregular heat beat.  Alternatives to this procedure were also discussed.  PICC/Midline Placement Documentation  PICC / Midline Double Lumen 12/21/14 PICC Right Cephalic 39 cm 0 cm (Active)  Exposed Catheter (cm) 0 cm 12/21/2014  5:00 PM  Dressing Change Due 12/28/14 12/21/2014  5:00 PM       Lotus Santillo, Anderson MaltaJessica M 12/21/2014, 5:46 PM

## 2014-12-21 NOTE — Progress Notes (Signed)
[place] NOTE  Pharmacy Consult for TPN Indication: Prolonged ileus, NPO x ~7d  Allergies  Allergen Reactions  . Sulfonamide Derivatives Anaphylaxis  . Aspirin Nausea And Vomiting  . Penicillins Hives  . Latex Rash    Patient Measurements: Height: 5' 3"  (160 cm) Weight: 277 lb 1.9 oz (125.7 kg) IBW/kg (Calculated) : 52.4 Adjusted Body Weight: 73kg  Vital Signs: Temp: 98.1 F (36.7 C) (06/28 0400) Temp Source: Oral (06/28 0400) BP: 149/59 mmHg (06/28 0600) Pulse Rate: 84 (06/28 0700) Intake/Output from previous day: 06/27 0701 - 06/28 0700 In: 2360 [P.O.:50; NG/GT:90; IV Piggyback:300; TPN:1920] Out: 5115 [Urine:4965; Emesis/NG output:100; Drains:50] Intake/Output from this shift:    Labs:  Recent Labs  12/20/14 0410  WBC 12.3*  HGB 8.3*  HCT 26.8*  PLT 629*     Recent Labs  12/19/14 0440 12/20/14 0410 12/21/14 0400  NA 140 138 139  K 3.7 4.3 3.4*  CL 104 99* 97*  CO2 29 32 33*  GLUCOSE 189* 288* 173*  BUN 6 9 13   CREATININE 0.42* 0.56 0.59  CALCIUM 8.0* 8.7* 8.4*  MG 1.9 1.9 1.8  PHOS 4.4 4.8* 4.4  PROT  --  5.8*  --   ALBUMIN  --  1.6*  --   AST  --  28  --   ALT  --  30  --   ALKPHOS  --  84  --   BILITOT  --  0.2*  --   PREALBUMIN  --  13.9*  --   TRIG  --  288*  --    Estimated Creatinine Clearance: 104.9 mL/min (by C-G formula based on Cr of 0.59).    Recent Labs  12/20/14 1911 12/20/14 2355 12/21/14 0400  GLUCAP 184* 175* 158*    Insulin Requirements in the past 24 hours:  22 units SSI + 60 units regular insulin in TPN bag  Assessment: 16 yof admitted 12/08/14 with 2 night hx of perforated appendix. Pt placed NPO and originally tried to medically manage. Appeared to improve initially with abx but had more pain, emesis, no more bowel function, abdomen more tender, wbc increasing. Went for emergent exploration and found to have severe peritonitis + bowel ischemia, underwent appendectomy and SBR. Transferred to  ICU. Nutrition recommends TPN initiation if unable to extubate and initiate feeding within 7-10 days of intubation. Pharmacy consulted to initiate TPN on 12/15/14 due to anticipated prolonged ileus as bowel left in discontinuity. Pt NPO x ~7days inpatient.  Surgeries/Procedures: 6/17:  Emergency exploration - appendectomy, SBR, abdominal wound vac 6/19: Ex-lap, SB anastomosis, possible abdomen closure (failed) 6/21: Abdominal vacuum change, partial abdomen closure 6/23: abdomen closure  GI: Prealbumin 9.2 >> 13.9. Ruptured appendix, peritonitis, ischemic bowel s/p appendectomy/SBR/abdominal wound vac. Abdominal wall could not be closed with edema/dilation of SB. Anticipate post-op ileus as bowel left in discontinuity and TPN started. Returned to OR 6/23 for abdomen closure >> back to ICU post-op. 337m/24h emesis/OG output, 1044m24h drain output- both decreased. Small BM on 6/25, +BS - to remove NGT today. Ileus improving - to start clear liquids today. PMH of dysphagia. PPI  Endo: DM2 - Prior to starting TPN, her sugars were well controlled, though it appears she is insulin dependent at home. Her A1C on 12/07/2014 was 6. CBGs improved 153-184 (TPN bag with insulin increased to 60 units + SSI)  Lytes: Cl low 97, K 3.4 with no replacement yesterday (goal >/=4 with ileus), Mg decreased 1.8 s/p Mg sulfate 2g yesterday (goal >/=  2 with ileus), phos down to 4.4 (no phos binders), CorCa up 10.3 (ca x phos product = 45.3)  Renal: AKI on admit secondary to dehydration. SCr now stable at 0.59, normalized CrCl~74. UOP good 1.6 ml/kg/h - on lasix 34m IV q12h  Pulm: extubated 6/25 PM to [place]  Cards: BP soft-wnl, HR wnl. HLD, CAD. plavix  Hepatobil: LFTs/tbili/alk phos ok, TG now improved 161>231>992>288. Off propofol and not receiving IVFE with TPN  Neuro: off sedation- getting Dilaudid 152mIV q2h PRN for pain  ID: Ertapenem D#12 + Fluconazole D#5 for septic shock/peritonitis. WBC down to 12.3,  afebrile  fluconazole 6/24>> Ertapenem 6/15>> Vanc 6/18>> 6/22  6/17 peritoneal fluid>> microaerophilic strep, Ecoli (pan-S) 6/18 BCx2>> ngf 6/25 cdiff: neg 6/24 BCx2>>ngtd  Best Practices: scds, heparin [place] TPN Access: PICC placed 6/15 TPN start date: 12/15/14>>  Current Nutrition:  Clinimix E 5/15 @ 80 ml/h- provides 96g protein and 1363kcal Clear liquid to start this AM  Nutritional Goals:  1149-1300 kCal, 135-145 grams of protein per day per RD assessment on 6/22  Plan:  - Continue Clinimix E 5/15 at 80 ml/h (will not give fats for 1st 7 days in ICU on TPN- today is D#7 without fats, though patient received propofol for 2 of those days and TG are now improving) - Will not be able to achieve protein goal with premixed TPN and hypocaloric goals- would anticipate goals to change now that patient is extubated. Will follow up any RD recommendations - F/u diet advancement and ability to wean TPN - Daily multivitamin + trace elements in TPN - Increase regular insulin to 65 units in TPN bag + continue SSI q4h and monitor CBGs - k runs x 4 already ordered per MD - Magnesium sulfate 3g IV x1 - F/u AM labs  HaElicia LampPharmD Clinical Pharmacist - Resident Pager [phone]/28/2016 7:54 AM

## 2014-12-21 NOTE — Progress Notes (Signed)
Patient received from 2S. Patient accompanied by RN and sitter. Waist restraint in place. Patient oriented to self and confused. Patient toileted. Patient oriented to room and shown how to use call light. Sitter remains at bedside.

## 2014-12-21 NOTE — Progress Notes (Signed)
Patient ID: SHAUNTELL IGLESIA, female   DOB: Apr 30, 1961, 54 y.o.   MRN: [id number] 5 Days Post-Op  Subjective: Pt is a bit confused, but does answer questions.  She is passing some flatus.  Only had about 50cc of residual from NGT.  Mobilizing with 2 person assist to get up to the bedside.  Objective: Vital signs in last 24 hours: Temp:  [97.8 F (36.6 C)-98.3 F (36.8 C)] 98.2 F (36.8 C) (06/28 0700) Pulse Rate:  [68-86] 86 (06/28 0800) Resp:  [13-27] 20 (06/28 0800) BP: (119-158)/(53-94) 152/80 mmHg (06/28 0800) SpO2:  [95 %-100 %] 98 % (06/28 0800) Weight:  [125.7 kg (277 lb 1.9 oz)] 125.7 kg (277 lb 1.9 oz) (06/28 0400) Last BM Date: 12/18/14  Intake/Output from previous day: 06/27 0701 - 06/28 0700 In: 2360 [P.O.:50; NG/GT:90; IV Piggyback:300; TPN:1920] Out: 5115 [Urine:4965; Emesis/NG output:100; Drains:50] Intake/Output this shift: Total I/O In: 80 [TPN:80] Out: 400 [Urine:400]  PE: Abd: soft, +BS, wound VAC in place, obese Heart: regular Lungs: CTAB  Lab Results:   Recent Labs  12/20/14 0410  WBC 12.3*  HGB 8.3*  HCT 26.8*  PLT 629*   BMET  Recent Labs  12/20/14 0410 12/21/14 0400  NA 138 139  K 4.3 3.4*  CL 99* 97*  CO2 32 33*  GLUCOSE 288* 173*  BUN 9 13  CREATININE 0.56 0.59  CALCIUM 8.7* 8.4*   PT/INR No results for input(s): LABPROT, INR in the last 72 hours. CMP     Component Value Date/Time   NA 139 12/21/2014 0400   K 3.4* 12/21/2014 0400   CL 97* 12/21/2014 0400   CO2 33* 12/21/2014 0400   GLUCOSE 173* 12/21/2014 0400   BUN 13 12/21/2014 0400   CREATININE 0.59 12/21/2014 0400   CALCIUM 8.4* 12/21/2014 0400   PROT 5.8* 12/20/2014 0410   ALBUMIN 1.6* 12/20/2014 0410   AST 28 12/20/2014 0410   ALT 30 12/20/2014 0410   ALKPHOS 84 12/20/2014 0410   BILITOT 0.2* 12/20/2014 0410   GFRNONAA >60 12/21/2014 0400   GFRAA >60 12/21/2014 0400   Lipase     Component Value Date/Time   LIPASE <10* 12/07/2014 1930        Studies/Results: No results found.  Anti-infectives: Anti-infectives    Start     Dose/Rate Route Frequency Ordered Stop   12/17/14 1000  fluconazole (DIFLUCAN) IVPB 400 mg     400 mg 100 mL/hr over 120 Minutes Intravenous Every 24 hours 12/17/14 0841     12/13/14 2200  vancomycin (VANCOCIN) IVPB 1000 mg/200 mL premix  Status:  Discontinued     1,000 mg 200 mL/hr over 60 Minutes Intravenous Every 8 hours 12/13/14 1429 12/15/14 0901   12/11/14 1400  vancomycin (VANCOCIN) IVPB 1000 mg/200 mL premix  Status:  Discontinued     1,000 mg 200 mL/hr over 60 Minutes Intravenous Every 12 hours 12/11/14 1300 12/13/14 1429   12/10/14 1700  vancomycin (VANCOCIN) 1,500 mg in sodium chloride 0.9 % 500 mL IVPB  Status:  Discontinued     1,500 mg 250 mL/hr over 120 Minutes Intravenous Every 24 hours 12/10/14 1601 12/11/14 1300   12/09/14 1100  ertapenem (INVANZ) 1 g in sodium chloride 0.9 % 50 mL IVPB  Status:  Discontinued    Comments:  She has hives from pcn before, the cross reactivity is low and I think there is some benefit to hopefully avoiding surgery in this patient without other good choices for abx, I think  cipro/flagyl has too much resistance and she has perforation   1 g 100 mL/hr over 30 Minutes Intravenous Every 24 hours 12/09/14 0940 12/09/14 1017   12/09/14 1100  ertapenem (INVANZ) 1 g in sodium chloride 0.9 % 50 mL IVPB  Status:  Discontinued    Comments:  She has hives from pcn before, the cross reactivity is low and I think there is some benefit to hopefully avoiding surgery in this patient without other good choices for abx, I think cipro/flagyl has too much resistance and she has perforation   1 g 100 mL/hr over 30 Minutes Intravenous Every 24 hours 12/09/14 1017 12/09/14 1018   12/09/14 1100  ertapenem (INVANZ) 1 g in sodium chloride 0.9 % 50 mL IVPB    Comments:  She has hives from pcn before, the cross reactivity is low and I think there is some benefit to hopefully  avoiding surgery in this patient without other good choices for abx, I think cipro/flagyl has too much resistance and she has perforation   1 g 100 mL/hr over 30 Minutes Intravenous Every 24 hours 12/09/14 1020     12/09/14 0945  ertapenem (INVANZ) injection 1 g  Status:  Discontinued    Comments:  She has hives from pcn before, the cross reactivity is low and I think there is some benefit to hopefully avoiding surgery in this patient without other good choices for abx, I think cipro/flagyl has too much resistance and she has perforation   1 g Intramuscular Every 24 hours 12/09/14 0937 12/09/14 0941   12/08/14 0800  ciprofloxacin (CIPRO) IVPB 400 mg  Status:  Discontinued     400 mg 200 mL/hr over 60 Minutes Intravenous Every 12 hours 12/08/14 0652 12/09/14 0933   12/08/14 0800  metroNIDAZOLE (FLAGYL) IVPB 500 mg  Status:  Discontinued     500 mg 100 mL/hr over 60 Minutes Intravenous Every 8 hours 12/08/14 0652 12/09/14 0933   12/08/14 0330  piperacillin-tazobactam (ZOSYN) IVPB 3.375 g     3.375 g 100 mL/hr over 30 Minutes Intravenous  Once 12/08/14 0328 12/08/14 0513       Assessment/Plan   POD 11, s/p appendectomy, small bowel resection, application of abdominal wound VAC S/p multiple takebacks for open abdomen and closure -ileus resolving -dc NGT -start clear liquids -cont PT for mobilization  -cont pulm toilet -tx to SDU when bed available -Invanz D12/? -VAC change MWF -dc foley -check labs in am Hypokalemia -replace K today DVT prophylaxis -scds/heparin  LOS: 13 days    Anecia Nusbaum E 12/21/2014, 8:57 AM Pager: [phone]

## 2014-12-21 NOTE — Progress Notes (Signed)
Called to bedside by ICU nurse.  Patient was trying to get out of bed this am when Dr. Corliss Skainssuei saw her.  She apparently tried to get up OOB again this afternoon and slid out of the bed with her head ending towards the floor.   This was un-wittnessed.  She has been confused despite not being on any pain meds since last night.  She denies any pain.  Dr. Corliss Skainssuei came to evaluate the patient.  He recommended we d/c stronger pain medications which I have done.  Sitter to bedside and soft waist belt until sitter can be in place.  PICC came out.  Still needing TPN so will ask IV team to replace it.  Monitor CBG's more closely until it can be resumed.  Dr. Corliss Skainssuei did not see need for imaging at this time, but frequent neuro checks and vitals until tomorrow.   Aris GeorgiaMegan Dort, PA-C [facility] [phone]

## 2014-12-21 NOTE — Progress Notes (Signed)
Nutrition Follow-up  DOCUMENTATION CODES:  Morbid obesity  INTERVENTION:   TPN per pharmacy  Resource Breeze po BID, each supplement provides 250 kcal and 9 grams of protein  NEW NUTRITION DIAGNOSIS:  Increased nutrient needs related to  (wound/post-op healing) as evidenced by estimated needs, ongoing  GOAL:  Patient will meet greater than or equal to 90% of their needs, progressing  MONITOR:  PO intake, Supplement acceptance, Labs, Weight trends, Skin, I & O's  ASSESSMENT: [age] yof who was admitted 6/14 W/ 2 night h/o perforated appendicitis. She appeared to improve initially with abx and resuscitation Then on 6/17 she had more pain, emesis, no more bowel function and her abdomen was more tender. Her wbc was rising. She went for emergent exploration, found to have severe peritonitis as well as evidence of bowel ischemia. She underwent appendectomy, SB resection and was left in discontinuity. Returned to the ICU on vent. PCCM asked to assist w/ care.   Patient s/p procedures 6/17: APPENDECTOMY SMALL BOWEL RESECTION APPLICATION OF ABDOMINAL WOUND VAC  Patient extubated 6/25.  Propofol discontinued.  Ileus resolving.  NGT to be removed.  Advanced to Clear Liquids.  Patient is receiving TPN with Clinimix E 5/15 @ 80 ml/hr. Provides 1363 kcal and 96 gm protein per day. No IVFE at this time. Meets 72% minimum estimated energy needs and 80% minimum estimated protein needs.  CWOCN note 6/27 reviewed.  Pt with full thickness post-op abdominal wound.  Height:  Ht Readings from Last 1 Encounters:  12/08/14 5\' 3"  (1.6 m)    Weight:  Wt Readings from Last 1 Encounters:  12/21/14 277 lb 1.9 oz (125.7 kg)    Ideal Body Weight:  52.3 kg  Wt Readings from Last 10 Encounters:  12/21/14 277 lb 1.9 oz (125.7 kg)  09/09/14 282 lb (127.914 kg)  06/10/14 283 lb 9.6 oz (128.64 kg)  03/23/14 279 lb (126.554 kg)  12/15/13 276 lb 3.2 oz (125.283 kg)  09/14/13 280 lb 6.4 oz  (127.189 kg)  06/16/13 279 lb 3.2 oz (126.644 kg)  12/15/12 277 lb 9.6 oz (125.919 kg)  05/16/10 211 lb (95.709 kg)  05/09/10 205 lb (92.987 kg)    BMI:  Body mass index is 49.1 kg/(m^2).  R-estimated Nutritional Needs:  Kcal:  1900-2100  Protein:  120-130 gm  Fluid:  per MD  Skin:  Wound (see comment) (post-op full thickness )  Diet Order:  .TPN (CLINIMIX-E) Adult Diet clear liquid Room service appropriate?: Yes; Fluid consistency:: Thin .TPN (CLINIMIX-E) Adult  EDUCATION NEEDS:  No education needs identified at this time   Intake/Output Summary (Last 24 hours) at 12/21/14 1158 Last data filed at 12/21/14 1100  Gross per 24 hour  Intake   2530 ml  Output   5075 ml  Net  -2545 ml    Last BM:  6/25  Maureen ChattersKatie Posey Jasmin, RD, LDN Pager #: [phone] After-Hours Pager #: [phone]

## 2014-12-22 LAB — GLUCOSE, CAPILLARY
GLUCOSE-CAPILLARY: 169 mg/dL — AB (ref 65–99)
GLUCOSE-CAPILLARY: 174 mg/dL — AB (ref 65–99)
Glucose-Capillary: 149 mg/dL — ABNORMAL HIGH (ref 65–99)
Glucose-Capillary: 191 mg/dL — ABNORMAL HIGH (ref 65–99)
Glucose-Capillary: 182 mg/dL — ABNORMAL HIGH (ref 65–99)

## 2014-12-22 LAB — PHOSPHORUS: Phosphorus: 4.5 mg/dL (ref 2.5–4.6)

## 2014-12-22 LAB — CBC
HCT: 26 % — ABNORMAL LOW (ref 36.0–46.0)
HEMOGLOBIN: 8 g/dL — AB (ref 12.0–15.0)
MCH: 28.7 pg (ref 26.0–34.0)
MCHC: 30.8 g/dL (ref 30.0–36.0)
MCV: 93.2 fL (ref 78.0–100.0)
Platelets: 610 10*3/uL — ABNORMAL HIGH (ref 150–400)
RBC: 2.79 MIL/uL — ABNORMAL LOW (ref 3.87–5.11)
RDW: 13.9 % (ref 11.5–15.5)
WBC: 10.4 10*3/uL (ref 4.0–10.5)

## 2014-12-22 LAB — CULTURE, BLOOD (ROUTINE X 2)
CULTURE: NO GROWTH
CULTURE: NO GROWTH

## 2014-12-22 LAB — BASIC METABOLIC PANEL
ANION GAP: 9 (ref 5–15)
BUN: 12 mg/dL (ref 6–20)
CALCIUM: 8.4 mg/dL — AB (ref 8.9–10.3)
CO2: 30 mmol/L (ref 22–32)
CREATININE: 0.58 mg/dL (ref 0.44–1.00)
Chloride: 100 mmol/L — ABNORMAL LOW (ref 101–111)
Glucose, Bld: 158 mg/dL — ABNORMAL HIGH (ref 65–99)
Potassium: 3.5 mmol/L (ref 3.5–5.1)
Sodium: 139 mmol/L (ref 135–145)

## 2014-12-22 LAB — MAGNESIUM: Magnesium: 2 mg/dL (ref 1.7–2.4)

## 2014-12-22 MED ORDER — FAT EMULSION 20 % IV EMUL
240.0000 mL | INTRAVENOUS | Status: AC
  Administered 2014-12-22: 240 mL via INTRAVENOUS
  Filled 2014-12-22: qty 250

## 2014-12-22 MED ORDER — TRACE MINERALS CR-CU-MN-SE-ZN 10-1000-500-60 MCG/ML IV SOLN
INTRAVENOUS | Status: AC
  Administered 2014-12-22: 17:00:00 via INTRAVENOUS
  Filled 2014-12-22: qty 2400

## 2014-12-22 MED ORDER — POTASSIUM CHLORIDE CRYS ER 20 MEQ PO TBCR
40.0000 meq | EXTENDED_RELEASE_TABLET | Freq: Two times a day (BID) | ORAL | Status: AC
  Administered 2014-12-22 (×2): 40 meq via ORAL
  Filled 2014-12-22 (×2): qty 2

## 2014-12-22 NOTE — Progress Notes (Signed)
Patient ID: Allison SallesRobin M [name], female   DOB: [birth year], 54 y.o.   MRN: [id number] 6 Days Post-Op  Subjective: Pt still confused.  Tolerating clear liquids.  Having multiple BMs.  Objective: Vital signs in last 24 hours: Temp:  [97.8 F (36.6 C)-98.8 F (37.1 C)] 98.8 F (37.1 C) (06/29 0700) Pulse Rate:  [68-93] 68 (06/29 0400) Resp:  [19-32] 20 (06/29 0400) BP: (108-163)/(48-77) 148/63 mmHg (06/29 0400) SpO2:  [94 %-100 %] 99 % (06/29 0400) Weight:  [127.5 kg (281 lb 1.4 oz)] 127.5 kg (281 lb 1.4 oz) (06/28 1831) Last BM Date: 12/21/14  Intake/Output from previous day: 06/28 0701 - 06/29 0700 In: 2361.3 [P.O.:290; IV Piggyback:550; TPN:1521.3] Out: 1135 [Urine:1070; Drains:65] Intake/Output this shift:    PE: Abd:  Soft, appropriately tender, +BS, Obese, midline wound with VAC in place Heart: regular Lungs: CTAB  Lab Results:   Recent Labs  12/20/14 0410 12/22/14 0515  WBC 12.3* 10.4  HGB 8.3* 8.0*  HCT 26.8* 26.0*  PLT 629* 610*   BMET  Recent Labs  12/21/14 0400 12/22/14 0515  NA 139 139  K 3.4* 3.5  CL 97* 100*  CO2 33* 30  GLUCOSE 173* 158*  BUN 13 12  CREATININE 0.59 0.58  CALCIUM 8.4* 8.4*   PT/INR No results for input(s): LABPROT, INR in the last 72 hours. CMP     Component Value Date/Time   NA 139 12/22/2014 0515   K 3.5 12/22/2014 0515   CL 100* 12/22/2014 0515   CO2 30 12/22/2014 0515   GLUCOSE 158* 12/22/2014 0515   BUN 12 12/22/2014 0515   CREATININE 0.58 12/22/2014 0515   CALCIUM 8.4* 12/22/2014 0515   PROT 5.8* 12/20/2014 0410   ALBUMIN 1.6* 12/20/2014 0410   AST 28 12/20/2014 0410   ALT 30 12/20/2014 0410   ALKPHOS 84 12/20/2014 0410   BILITOT 0.2* 12/20/2014 0410   GFRNONAA >60 12/22/2014 0515   GFRAA >60 12/22/2014 0515   Lipase     Component Value Date/Time   LIPASE <10* 12/07/2014 1930       Studies/Results: No results found.  Anti-infectives: Anti-infectives    Start     Dose/Rate Route Frequency Ordered  Stop   12/17/14 1000  fluconazole (DIFLUCAN) IVPB 400 mg     400 mg 100 mL/hr over 120 Minutes Intravenous Every 24 hours 12/17/14 0841     12/13/14 2200  vancomycin (VANCOCIN) IVPB 1000 mg/200 mL premix  Status:  Discontinued     1,000 mg 200 mL/hr over 60 Minutes Intravenous Every 8 hours 12/13/14 1429 12/15/14 0901   12/11/14 1400  vancomycin (VANCOCIN) IVPB 1000 mg/200 mL premix  Status:  Discontinued     1,000 mg 200 mL/hr over 60 Minutes Intravenous Every 12 hours 12/11/14 1300 12/13/14 1429   12/10/14 1700  vancomycin (VANCOCIN) 1,500 mg in sodium chloride 0.9 % 500 mL IVPB  Status:  Discontinued     1,500 mg 250 mL/hr over 120 Minutes Intravenous Every 24 hours 12/10/14 1601 12/11/14 1300   12/09/14 1100  ertapenem (INVANZ) 1 g in sodium chloride 0.9 % 50 mL IVPB  Status:  Discontinued    Comments:  She has hives from pcn before, the cross reactivity is low and I think there is some benefit to hopefully avoiding surgery in this patient without other good choices for abx, I think cipro/flagyl has too much resistance and she has perforation   1 g 100 mL/hr over 30 Minutes Intravenous Every 24 hours 12/09/14  0940 12/09/14 1017   12/09/14 1100  ertapenem (INVANZ) 1 g in sodium chloride 0.9 % 50 mL IVPB  Status:  Discontinued    Comments:  She has hives from pcn before, the cross reactivity is low and I think there is some benefit to hopefully avoiding surgery in this patient without other good choices for abx, I think cipro/flagyl has too much resistance and she has perforation   1 g 100 mL/hr over 30 Minutes Intravenous Every 24 hours 12/09/14 1017 12/09/14 1018   12/09/14 1100  ertapenem (INVANZ) 1 g in sodium chloride 0.9 % 50 mL IVPB    Comments:  She has hives from pcn before, the cross reactivity is low and I think there is some benefit to hopefully avoiding surgery in this patient without other good choices for abx, I think cipro/flagyl has too much resistance and she has perforation    1 g 100 mL/hr over 30 Minutes Intravenous Every 24 hours 12/09/14 1020     12/09/14 0945  ertapenem (INVANZ) injection 1 g  Status:  Discontinued    Comments:  She has hives from pcn before, the cross reactivity is low and I think there is some benefit to hopefully avoiding surgery in this patient without other good choices for abx, I think cipro/flagyl has too much resistance and she has perforation   1 g Intramuscular Every 24 hours 12/09/14 0937 12/09/14 0941   12/08/14 0800  ciprofloxacin (CIPRO) IVPB 400 mg  Status:  Discontinued     400 mg 200 mL/hr over 60 Minutes Intravenous Every 12 hours 12/08/14 0652 12/09/14 0933   12/08/14 0800  metroNIDAZOLE (FLAGYL) IVPB 500 mg  Status:  Discontinued     500 mg 100 mL/hr over 60 Minutes Intravenous Every 8 hours 12/08/14 0652 12/09/14 0933   12/08/14 0330  piperacillin-tazobactam (ZOSYN) IVPB 3.375 g     3.375 g 100 mL/hr over 30 Minutes Intravenous  Once 12/08/14 0328 12/08/14 0513       Assessment/Plan   POD 12, s/p appendectomy, small bowel resection, application of abdominal wound VAC S/p multiple takebacks for open abdomen and closure -advance to full liquids -cont PT for mobilization  -cont pulm toilet -Invanz D13/? -VAC change MWF -check labs in am Hypokalemia -replace K today DVT prophylaxis -scds/heparin Dispo- SDU for today, likely can transfer tomorrow.  Still confused with restraint belt and safety sitter  LOS: 14 days    Nishita Isaacks E 12/22/2014, 8:27 AM Pager: [phone]

## 2014-12-22 NOTE — Clinical Social Work Note (Signed)
CSW attempted to contact patient's husband Ronnald CollumCarl Pavich to complete assessment and to discuss SNF placement.  Awaiting call back from patient's husband.  Ervin KnackEric R. Kriya Westra, MSW, Theresia MajorsLCSWA [phone] 12/22/2014 10:37 AM

## 2014-12-22 NOTE — Progress Notes (Signed)
Pt denies CPAP for the night.

## 2014-12-22 NOTE — Progress Notes (Signed)
PARENTERAL NUTRITION CONSULT NOTE  Pharmacy Consult for TPN Indication: Prolonged ileus, NPO x ~7d  Allergies  Allergen Reactions  . Sulfonamide Derivatives Anaphylaxis  . Aspirin Nausea And Vomiting  . Penicillins Hives  . Latex Rash    Patient Measurements: Height: 5' 2" (157.5 cm) Weight: 281 lb 1.4 oz (127.5 kg) IBW/kg (Calculated) : 50.1 Adjusted Body Weight: 73kg  Vital Signs: Temp: 98.8 F (37.1 C) (06/29 0740) Temp Source: Oral (06/29 0740) BP: 140/57 mmHg (06/29 0740) Pulse Rate: 72 (06/29 0740) Intake/Output from previous day: 06/28 0701 - 06/29 0700 In: 2361.3 [P.O.:290; IV Piggyback:550; TPN:1521.3] Out: 1135 [Urine:1070; Drains:65] Intake/Output from this shift: Total I/O In: 480 [TPN:480] Out: -   Labs:  Recent Labs  12/20/14 0410 12/22/14 0515  WBC 12.3* 10.4  HGB 8.3* 8.0*  HCT 26.8* 26.0*  PLT 629* 610*     Recent Labs  12/20/14 0410 12/21/14 0400 12/22/14 0515  NA 138 139 139  K 4.3 3.4* 3.5  CL 99* 97* 100*  CO2 32 33* 30  GLUCOSE 288* 173* 158*  BUN 9 13 12  CREATININE 0.56 0.59 0.58  CALCIUM 8.7* 8.4* 8.4*  MG 1.9 1.8 2.0  PHOS 4.8* 4.4 4.5  PROT 5.8*  --   --   ALBUMIN 1.6*  --   --   AST 28  --   --   ALT 30  --   --   ALKPHOS 84  --   --   BILITOT 0.2*  --   --   PREALBUMIN 13.9*  --   --   TRIG 288*  --   --    Estimated Creatinine Clearance: 104.1 mL/min (by C-G formula based on Cr of 0.58).    Recent Labs  12/21/14 2253 12/22/14 0353 12/22/14 0728  GLUCAP 183* 149* 169*    Insulin Requirements in the past 24 hours:  17 units SSI + 65 units regular insulin in TPN bag  Assessment: 54 yof admitted 12/08/14 with 2 night hx of perforated appendix. Pt placed NPO and originally tried to medically manage. Appeared to improve initially with abx but had more pain, emesis, no more bowel function, abdomen more tender, wbc increasing. Went for emergent exploration and found to have severe peritonitis + bowel ischemia,  underwent appendectomy and SBR. Transferred to ICU. Nutrition recommends TPN initiation if unable to extubate and initiate feeding within 7-10 days of intubation. Pharmacy consulted to initiate TPN on 12/15/14 due to anticipated prolonged ileus as bowel left in discontinuity. Pt NPO x ~7days inpatient.  Surgeries/Procedures: 6/17:  Emergency exploration - appendectomy, SBR, abdominal wound vac 6/19: Ex-lap, SB anastomosis, possible abdomen closure (failed) 6/21: Abdominal vacuum change, partial abdomen closure 6/23: abdomen closure  GI: Prealbumin 9.2 >> 13.9. Ruptured appendix, peritonitis, ischemic bowel s/p appendectomy/SBR/abdominal wound vac. Abdominal wall could not be closed with edema/dilation of SB. Anticipate post-op ileus as bowel left in discontinuity and TPN started. Returned to OR 6/23 for abdomen closure >> back to ICU post-op. No emesis/OG output recorded yesterday, 25ml/24h drain output- decreased. BM 6/28, +BS - NGT out 6/28. Ileus resolving - Start clear liquids on 6/28 (tolerating). PMH of dysphagia. PPI. Patient had fall yesterday afternoon and PICC came out (replaced). To advance to full liquids today - per sitter, patient confused and has yet to take in anything today  Endo: DM2 - Prior to starting TPN, her sugars were well controlled, though it appears she is insulin dependent at home. Her A1C on 12/07/2014 was 6.   CBGs stable 149-183 (TPN bag with insulin increased to 65 units + SSI)  Lytes: Cl improving, K up to 3.5 s/p k runs x 4 yesterday (goal >/=4 with ileus), Mg up to 2 s/p Mg sulfate 3g yesterday (goal >/=2 with ileus), phos up to 4.5 (no phos binders), CorCa stable 10.3 (ca x phos product = 46.4)  Renal: AKI on admit secondary to dehydration. SCr now stable at 0.58, normalized CrCl~74. UOP 0.3 ml/kg/h per documentation- lasix d/c'd  Pulm: extubated 6/25 PM to [place]  Cards: BP soft-wnl, HR wnl. HLD, CAD. plavix  Hepatobil: LFTs/tbili/alk phos ok, TG now improved  161>231>992>288. Off propofol and not receiving IVFE with TPN  Neuro: off sedation- patient with fall yesterday, confused. D/c'd stronger pain meds  ID: Ertapenem D#13 + Fluconazole D#6 for septic shock/peritonitis. WBC now wnl, afebrile  fluconazole 6/24>> Ertapenem 6/15>> Vanc 6/18>> 6/22  6/17 peritoneal fluid>> microaerophilic strep, Ecoli (pan-S) 6/18 BCx2>> ngf 6/25 cdiff: neg 6/24 BCx2>>ngtd  Best Practices: scds, heparin [place] TPN Access: PICC placed 6/15; replaced 6/28 TPN start date: 12/15/14>>  Current Nutrition:  Clinimix E 5/15 @ 80 ml/h- provides 96g protein and 1363kcal [place]reeze po bid ordered - 250kcal + 9g protein each (none given yet) Full liquids started this AM (none taken in yet per sitter)  Nutritional Goals: Updated on 6/28 per Nutrition 1900-2100 kCal, 120-130 grams of protein per day  Plan:  - Increase Clinimix E 5/15 to goal rate 100 ml/h + 20% IVFE @10 ml/h M/W/F with new goals set by Nutrition (will start IVFE today after holding for 7 days in ICU, although propofol given 1st 2 days in ICU) - TPN will provide average of 1910kcal + 120g protein - F/u diet advancement and ability to wean TPN - Daily multivitamin + trace elements in TPN - Increase regular insulin to 75 units in TPN bag with increased rate + continue SSI q4h and monitor CBGs - Kdur 40mEq bid ordered per MD - will not order additional K - F/u TPN labs in AM   Baird, PharmD Clinical Pharmacist - Resident Pager [phone] 12/22/2014 10:10 AM  

## 2014-12-22 NOTE — Clinical Social Work Note (Signed)
CSW met with patient to discuss SNF placement, patient was explained search process and gave permission for CSW to look for short term rehab placement.  CSW gave patient list of SNFs that she can look at.  CSW to complete formal assessment, fax out patient and complete FL2.  Jones Broom. Twilight, MSW, [place] 12/22/2014 4:49 PM

## 2014-12-23 LAB — COMPREHENSIVE METABOLIC PANEL
ALBUMIN: 1.9 g/dL — AB (ref 3.5–5.0)
ALK PHOS: 103 U/L (ref 38–126)
ALT: 32 U/L (ref 14–54)
AST: 30 U/L (ref 15–41)
Anion gap: 11 (ref 5–15)
BUN: 12 mg/dL (ref 6–20)
CALCIUM: 8.4 mg/dL — AB (ref 8.9–10.3)
CHLORIDE: 102 mmol/L (ref 101–111)
CO2: 25 mmol/L (ref 22–32)
Creatinine, Ser: 0.6 mg/dL (ref 0.44–1.00)
GFR calc Af Amer: >60 mL/min (ref >60)
GLUCOSE: 181 mg/dL — AB (ref 65–99)
POTASSIUM: 5.2 mmol/L — AB (ref 3.5–5.1)
Sodium: 138 mmol/L (ref 135–145)
Total Bilirubin: 0.2 mg/dL — ABNORMAL LOW (ref 0.3–1.2)
Total Protein: 6.1 g/dL — ABNORMAL LOW (ref 6.5–8.1)

## 2014-12-23 LAB — GLUCOSE, CAPILLARY
GLUCOSE-CAPILLARY: 148 mg/dL — AB (ref 65–99)
GLUCOSE-CAPILLARY: 156 mg/dL — AB (ref 65–99)
GLUCOSE-CAPILLARY: 168 mg/dL — AB (ref 65–99)
Glucose-Capillary: 161 mg/dL — ABNORMAL HIGH (ref 65–99)
Glucose-Capillary: 169 mg/dL — ABNORMAL HIGH (ref 65–99)
Glucose-Capillary: 156 mg/dL — ABNORMAL HIGH (ref 65–99)

## 2014-12-23 LAB — MAGNESIUM: MAGNESIUM: 1.8 mg/dL (ref 1.7–2.4)

## 2014-12-23 LAB — PHOSPHORUS: Phosphorus: 4.6 mg/dL (ref 2.5–4.6)

## 2014-12-23 MED ORDER — INSULIN ASPART 100 UNIT/ML ~~LOC~~ SOLN
0.0000 [IU] | Freq: Three times a day (TID) | SUBCUTANEOUS | Status: DC
  Administered 2014-12-23 – 2014-12-24 (×5): 3 [IU] via SUBCUTANEOUS
  Administered 2014-12-24: 2 [IU] via SUBCUTANEOUS
  Administered 2014-12-24: 5 [IU] via SUBCUTANEOUS
  Administered 2014-12-25: 3 [IU] via SUBCUTANEOUS
  Administered 2014-12-25: 2 [IU] via SUBCUTANEOUS

## 2014-12-23 NOTE — Clinical Social Work Note (Signed)
Clinical Social Work Assessment  Patient Details  Name: Allison [name] MRN: [id number] Date of Birth: [birth year]  Date of referral:  12/22/14               Reason for consult:  Facility Placement                Permission sought to share information with:    Permission granted to share information::  Yes, Verbal Permission Granted  Name::     Ronnald CollumCarl Reth patient's husband  Agency::  SNF facilities  Relationship::     Contact Information:     Housing/Transportation Living arrangements for the past 2 months:  [facility]illed Building [facility] Facility Source of Information:  Patient Patient Interpreter Needed:  None Criminal Activity/Legal Involvement Pertinent to Current Situation/Hospitalization:  No - Comment as needed Significant Relationships:  Spouse Lives with:  Spouse Do you feel safe going back to the place where you live?  Yes Need for family participation in patient care:  Yes (Comment) (Patient sometimes has confusion)  Care giving concerns:  Patient expressed that she feels like she needs some therapy before she returns home.   Social Worker assessment / plan:  Patient is a 54 year old female who is married and lives with her husband.  Patient is alert and oriented x3, patient did not know time.  Patient was pleasant and was able to discuss going to a SNF for short term rehab.  Patient states she understands PT is recommending going to a SNF for short term rehab.  CSW explained process to patient and she agreed that CSW could start SNF placement search.  Patient was given a list of SNFs and CSW informed her about what is involved with discharge planning.  [facility], patient stated she lives with her husband and both take care of each other.  Patient stated she did not have any other questions, CSW gave patient contact information.  CSW to begin SNF placement search.  Employment status:  Disabled (Comment on whether or not  currently receiving Disability) Insurance information:  Managed Care PT Recommendations:  Skilled Nursing Facility Information / Referral to community resources:     Patient/Family's Response to care:  Patient in in agreement to going to SNF for short term rehab.  Patient/Family's Understanding of and Emotional Response to Diagnosis, Current Treatment, and Prognosis:  Patient expressed her understanding of needing to go to therapy for short term rehab.  Emotional Assessment Appearance:  Appears stated age Attitude/Demeanor/Rapport:    Affect (typically observed):  Pleasant, Calm, Appropriate Orientation:  Oriented to Self, Oriented to Place, Oriented to  Time, Oriented to Situation Alcohol / Substance use:  Not Applicable Psych involvement (Current and /or in the community):  No (Comment)  Discharge Needs  Concerns to be addressed:  No discharge needs identified Readmission within the last 30 days:  No Current discharge risk:  None Barriers to Discharge:  No Barriers Identified   Ervin KnackEric R. Griselle Rufer, MSW, Theresia MajorsLCSWA [phone] 12/23/2014 10:36 AM

## 2014-12-23 NOTE — Significant Event (Signed)
Spoke with Barnetta ChapelKelly Osborne, CCS PA regard telemetry. Per PA, patient can be non telemetry on the floor.

## 2014-12-23 NOTE — Progress Notes (Signed)
[facility] NOTE  Pharmacy Consult for TPN Indication: Prolonged ileus, NPO x ~7d  Allergies  Allergen Reactions  . Sulfonamide Derivatives Anaphylaxis  . Aspirin Nausea And Vomiting  . Penicillins Hives  . Latex Rash    Patient Measurements: Height: _0  (157.5 cm) Weight: 281 lb 1.4 oz (127.5 kg) IBW/kg (Calculated) : 50.1 Adjusted Body Weight: 73kg  Vital Signs: Temp: 98.6 F (37 C) (06/30 0343) Temp Source: Oral (06/30 0343) BP: 138/63 mmHg (06/30 0345) Pulse Rate: 93 (06/30 0613) Intake/Output from previous day: 06/29 0701 - 06/30 0700 In: 4141.7 [P.O.:980; IV Piggyback:250; TPN:2911.7] Out: -  Intake/Output from this shift:    Labs:  Recent Labs  12/22/14 0515  WBC 10.4  HGB 8.0*  HCT 26.0*  PLT 610*     Recent Labs  12/21/14 0400 12/22/14 0515  NA 139 139  K 3.4* 3.5  CL 97* 100*  CO2 33* 30  GLUCOSE 173* 158*  BUN 13 12  CREATININE 0.59 0.58  CALCIUM 8.4* 8.4*  MG 1.8 2.0  PHOS 4.4 4.5   Estimated Creatinine Clearance: 104.1 mL/min (by C-G formula based on Cr of 0.58).    Recent Labs  12/22/14 1925 12/23/14 0009 12/23/14 0342  GLUCAP 174* 148* 156*    Insulin Requirements in the past 24 hours:  12 units SSI + 75 units regular insulin in TPN bag  Assessment: 80 yof admitted 12/08/14 with 2 night hx of perforated appendix. Pt placed NPO and originally tried to medically manage. Appeared to improve initially with abx but had more pain, emesis, no more bowel function, abdomen more tender, wbc increasing. Went for emergent exploration and found to have severe peritonitis + bowel ischemia, underwent appendectomy and SBR. Transferred to ICU. Pharmacy consulted to initiate TPN on 12/15/14 due to anticipated prolonged ileus as bowel left in discontinuity. Pt NPO x ~7days inpatient.  Surgeries/Procedures: 6/17:  Emergency exploration - appendectomy, SBR, abdominal wound vac 6/19: Ex-lap, SB anastomosis, possible abdomen  closure (failed) 6/21: Abdominal vacuum change, partial abdomen closure 6/23: abdomen closure  GI: Ruptured appendix, peritonitis, ischemic bowel s/p appendectomy/SBR/abdominal wound vac. Abdominal wall could not be closed with edema/dilation of SB. Anticipate post-op ileus as bowel left in discontinuity and TPN started. Returned to OR 6/23 for abdomen closure >> back to ICU post-op. No emesis/OG output recorded yesterday. Last BM 6/29, +BS - NGT out 6/28. Ileus resolving - Start clear liquids on 6/28 (tolerating). PMH of dysphagia. PPI. Prealbumin increased from 9.2 to 13.9. Patient had fall yesterday afternoon and PICC came out (replaced). Advanced to full liquids yesterday - per patient and surgery seems to be tolerating. Trial of carb modified diet today. Spoke with surgery and okay to wean TPN off  Endo: DM2 - Prior to starting TPN, her sugars were well controlled, though it appears she is insulin dependent at home. Her A1C on 12/07/2014 was 6. CBGs stable 140-190s (TPN bag with insulin increased to 75 units + SSI)  Lytes: No new labs today. Cl improving, K up to 3.5 (replaced 8mq x 2) (goal >/=4 with ileus), Mg up to 2 s/p Mg sulfate 3g yesterday (goal >/=2 with ileus), phos up to 4.5 (no phos binders), CorCa stable 10.3 (ca x phos product = 46.4)  Renal: AKI on admit secondary to dehydration. SCr now stable at 0.58, normalized CrCl~74. UOP 0.3 ml/kg/h per documentation- lasix d/c'd  Pulm: extubated 6/25 PM to [place]  Cards: BP and HR ok. HLD, CAD. plavix  Hepatobil: LFTs/tbili/alk phos  ok, TG now improved 161>231>992>288. Off propofol and not receiving IVFE with TPN  Neuro: off sedation- patient with fall yesterday, confused. D/c'd stronger pain meds  ID: Ertapenem D#14 + Fluconazole D#7 for septic shock/peritonitis. WBC now wnl, afebrile  fluconazole 6/24 >> Ertapenem 6/15 >> Vanc 6/18>> 6/22  6/17 peritoneal fluid>> microaerophilic strep, Ecoli (pan-S) 6/18 BCx2>> ngf 6/25 cdiff:  neg 6/24 BCx2>>ngtd  Best Practices: scds, heparin [place] TPN Access: PICC placed 6/15; replaced 6/28 TPN start date: 12/15/14>>  Current Nutrition:  Clinimix E 5/15 @ 100 ml/h + IVFE _0 /hr MWF - provides 120g protein and 1910 kcal [place]ree[place] po bid ordered - 250kcal + 9g protein each (x 1 6/29 and x 1 6/30) Full liquids   Nutritional Goals: Updated on 6/28 per Nutrition 1900-2100 kCal, 120-130 grams of protein per day  Plan:  Wean Clinimix E 5/15 to 87m/hr for 2 hrs then stop Stop 20% IVFE today Change SSI and CBGs to TIDwc + QHS D/C TPN labs and orders  NElenor Quinones PharmD Clinical Pharmacist Resident Pager [phone]/30/2016 7:54 AM

## 2014-12-23 NOTE — Clinical Social Work Note (Signed)
CSW gave handoff to unit CSW who will continue to follow patient throughout discharge planning.  This CSW to sign off.  Ervin KnackEric R. Kenley Troop, MSW, Theresia MajorsLCSWA [phone] 12/23/2014 10:38 AM

## 2014-12-23 NOTE — Progress Notes (Signed)
ANTIBIOTIC CONSULT NOTE  Pharmacy Consult for fluconazole Indication: empiric for continued fevers  Allergies  Allergen Reactions  . Sulfonamide Derivatives Anaphylaxis  . Aspirin Nausea And Vomiting  . Penicillins Hives  . Latex Rash    Patient Measurements: Height:  (157.5 cm) Weight: 281 lb 1.4 oz (127.5 kg) IBW/kg (Calculated) : 50.1  Vital Signs: Temp: 99.8 F (37.7 C) (06/30 0759) Temp Source: Oral (06/30 0759) BP: 129/68 mmHg (06/30 0759) Pulse Rate: 86 (06/30 0759) Intake/Output from previous day: 06/29 0701 - 06/30 0700 In: 4141.7 [P.O.:980; IV Piggyback:250; TPN:2911.7] Out: -  Intake/Output from this shift: Total I/O In: 500.5 [TPN:500.5] Out: -   Labs:  Recent Labs  12/21/14 0400 12/22/14 0515  WBC  --  10.4  HGB  --  8.0*  PLT  --  610*  CREATININE 0.59 0.58   Estimated Creatinine Clearance: 104.1 mL/min (by C-G formula based on Cr of 0.58). No results for input(s): VANCOTROUGH, VANCOPEAK, VANCORANDOM, GENTTROUGH, GENTPEAK, GENTRANDOM, TOBRATROUGH, TOBRAPEAK, TOBRARND, AMIKACINPEAK, AMIKACINTROU, AMIKACIN in the last 72 hours.   Microbiology: Recent Results (from the past 720 hour(s))  Anaerobic culture     Status: None   Collection Time: 12/10/14  1:52 PM  Result Value Ref Range Status   Specimen Description FLUID PERITONEAL  Final   Special Requests ON A SWAB  Final   Gram Stain   Final    MODERATE WBC PRESENT,BOTH PMN AND MONONUCLEAR FEW GRAM POSITIVE COCCI IN PAIRS RARE GRAM POSITIVE COCCI IN CHAINS Results Called toChrisandra Carota [phone] [address] CONFIRMED BY K WILDER    Culture NO ANAEROBES ISOLATED  Final   Report Status 12/16/2014 FINAL  Final  Body fluid culture     Status: None   Collection Time: 12/10/14  1:52 PM  Result Value Ref Range Status   Specimen Description FLUID PERITONEAL  Final   Special Requests ON A SWAB  Final   Gram Stain   Final    MODERATE WBC PRESENT,BOTH PMN AND MONONUCLEAR RARE GRAM POSITIVE COCCI  IN PAIRS Results Called toChrisandra Carota [phone] WILDERK    Culture   Final    RARE ESCHERICHIA COLI MODERATE MICROAEROPHILIC STREPTOCOCCI Standardized susceptibility testing for this organism is not available. Processing error. Culture reordered on new accession    Report Status 12/14/2014 FINAL  Final  Body fluid culture     Status: None   Collection Time: 12/10/14  1:52 PM  Result Value Ref Range Status   Specimen Description FLUID PERITONEAL ON A SWAB  Final   Special Requests NONE  Final   Gram Stain   Final    MODERATE WBC PRESENT,BOTH PMN AND MONONUCLEAR FEW GRAM POSITIVE COCCI IN PAIRS RARE GRAM POSITIVE COCCI IN CHAINS GRAM STAIN REVIEWED-AGREE WITH RESULT Chrisandra Carota [phone] [address]K    Culture   Final    RARE ESCHERICHIA COLI MODERATE MICROAEROPHILIC STREPTOCOCCI Standardized susceptibility testing for this organism is not available.    Report Status 12/17/2014 FINAL  Final   Organism ID, Bacteria ESCHERICHIA COLI  Final      Susceptibility   Escherichia coli - MIC*    AMPICILLIN <=2 SENSITIVE Sensitive     CEFAZOLIN <=4 SENSITIVE Sensitive     CEFEPIME <=1 SENSITIVE Sensitive     CEFTAZIDIME <=1 SENSITIVE Sensitive     CEFTRIAXONE <=1 SENSITIVE Sensitive     CIPROFLOXACIN <=0.25 SENSITIVE Sensitive     GENTAMICIN <=1 SENSITIVE Sensitive     IMIPENEM <=0.25 SENSITIVE Sensitive  TRIMETH/SULFA <=20 SENSITIVE Sensitive     AMPICILLIN/SULBACTAM <=2 SENSITIVE Sensitive     PIP/TAZO <=4 SENSITIVE Sensitive     * RARE ESCHERICHIA COLI  MRSA PCR Screening     Status: None   Collection Time: 12/10/14  9:43 PM  Result Value Ref Range Status   MRSA by PCR NEGATIVE NEGATIVE Final    Comment:        The GeneXpert MRSA Assay (FDA approved for NASAL specimens only), is one component of a comprehensive MRSA colonization surveillance program. It is not intended to diagnose MRSA infection nor to guide or monitor treatment for MRSA infections.   Culture, blood  (routine x 2)     Status: None   Collection Time: 12/11/14 10:49 AM  Result Value Ref Range Status   Specimen Description BLOOD LEFT ARM  Final   Special Requests BOTTLES DRAWN AEROBIC ONLY 10CC  Final   Culture NO GROWTH 5 DAYS  Final   Report Status 12/16/2014 FINAL  Final  Culture, blood (routine x 2)     Status: None   Collection Time: 12/11/14 10:55 AM  Result Value Ref Range Status   Specimen Description BLOOD LEFT ARM  Final   Special Requests BOTTLES DRAWN AEROBIC ONLY 2CC  Final   Culture NO GROWTH 5 DAYS  Final   Report Status 12/16/2014 FINAL  Final  Culture, blood (routine x 2)     Status: None   Collection Time: 12/17/14 10:28 AM  Result Value Ref Range Status   Specimen Description BLOOD BLOOD LEFT HAND  Final   Special Requests   Final    BOTTLES DRAWN AEROBIC AND ANAEROBIC 10CC BLUE 5CC RED   Culture NO GROWTH 5 DAYS  Final   Report Status 12/22/2014 FINAL  Final  Culture, blood (routine x 2)     Status: None   Collection Time: 12/17/14 10:43 AM  Result Value Ref Range Status   Specimen Description BLOOD BLOOD LEFT HAND  Final   Special Requests BOTTLES DRAWN AEROBIC AND ANAEROBIC 5CC  Final   Culture NO GROWTH 5 DAYS  Final   Report Status 12/22/2014 FINAL  Final  Clostridium Difficile by PCR (not [facility])     Status: None   Collection Time: 12/18/14  5:47 PM  Result Value Ref Range Status   C difficile by pcr NEGATIVE NEGATIVE Final   Medications:  Anti-infectives    Start     Dose/Rate Route Frequency Ordered Stop   12/17/14 1000  fluconazole (DIFLUCAN) IVPB 400 mg     400 mg 100 mL/hr over 120 Minutes Intravenous Every 24 hours 12/17/14 0841     12/13/14 2200  vancomycin (VANCOCIN) IVPB 1000 mg/200 mL premix  Status:  Discontinued     1,000 mg 200 mL/hr over 60 Minutes Intravenous Every 8 hours 12/13/14 1429 12/15/14 0901   12/11/14 1400  vancomycin (VANCOCIN) IVPB 1000 mg/200 mL premix  Status:  Discontinued     1,000 mg 200 mL/hr over 60 Minutes  Intravenous Every 12 hours 12/11/14 1300 12/13/14 1429   12/10/14 1700  vancomycin (VANCOCIN) 1,500 mg in sodium chloride 0.9 % 500 mL IVPB  Status:  Discontinued     1,500 mg 250 mL/hr over 120 Minutes Intravenous Every 24 hours 12/10/14 1601 12/11/14 1300   12/09/14 1100  ertapenem (INVANZ) 1 g in sodium chloride 0.9 % 50 mL IVPB  Status:  Discontinued    Comments:  She has hives from pcn before, the cross reactivity is  low and I think there is some benefit to hopefully avoiding surgery in this patient without other good choices for abx, I think cipro/flagyl has too much resistance and she has perforation   1 g 100 mL/hr over 30 Minutes Intravenous Every 24 hours 12/09/14 0940 12/09/14 1017   12/09/14 1100  ertapenem (INVANZ) 1 g in sodium chloride 0.9 % 50 mL IVPB  Status:  Discontinued    Comments:  She has hives from pcn before, the cross reactivity is low and I think there is some benefit to hopefully avoiding surgery in this patient without other good choices for abx, I think cipro/flagyl has too much resistance and she has perforation   1 g 100 mL/hr over 30 Minutes Intravenous Every 24 hours 12/09/14 1017 12/09/14 1018   12/09/14 1100  ertapenem (INVANZ) 1 g in sodium chloride 0.9 % 50 mL IVPB    Comments:  She has hives from pcn before, the cross reactivity is low and I think there is some benefit to hopefully avoiding surgery in this patient without other good choices for abx, I think cipro/flagyl has too much resistance and she has perforation   1 g 100 mL/hr over 30 Minutes Intravenous Every 24 hours 12/09/14 1020     12/09/14 0945  ertapenem (INVANZ) injection 1 g  Status:  Discontinued    Comments:  She has hives from pcn before, the cross reactivity is low and I think there is some benefit to hopefully avoiding surgery in this patient without other good choices for abx, I think cipro/flagyl has too much resistance and she has perforation   1 g Intramuscular Every 24 hours 12/09/14  0937 12/09/14 0941   12/08/14 0800  ciprofloxacin (CIPRO) IVPB 400 mg  Status:  Discontinued     400 mg 200 mL/hr over 60 Minutes Intravenous Every 12 hours 12/08/14 0652 12/09/14 0933   12/08/14 0800  metroNIDAZOLE (FLAGYL) IVPB 500 mg  Status:  Discontinued     500 mg 100 mL/hr over 60 Minutes Intravenous Every 8 hours 12/08/14 0652 12/09/14 0933   12/08/14 0330  piperacillin-tazobactam (ZOSYN) IVPB 3.375 g     3.375 g 100 mL/hr over 30 Minutes Intravenous  Once 12/08/14 0328 12/08/14 0513     Assessment: 54 yof initially admitted on 12/07/2014 with abdominal pain and found to have a ruptured appendix. Started on broad-spectrum antibiotics and she continues on ertapenem day #12. With continued fevers, she was also started on fluconazole empirically (day #4). Patient remains afebrile, WBC stable at 12.3, SCr stable and wnl, LFTs wnl.   Vanc 6/17 (no LD given) >> 6/22 Erta 6/16 >> Cipro 6/15 >> 6/16 Flagyl 6/15 >> 6/16 Zosyn 6/15 x 1 Flucon 6/24>>  6/26 Cdiff - NEG  6/24 Blood - NGTD  6/18 BCx >>NEG 6/17 Peritoneal fluid cx >> GS showing GPC in pairs >> rare ecoli, moderate microaerophilic strep  Goal of Therapy:  Eradication of infection  Plan:  - Continue Fluconazole 400mg  IV Q24H - Continue Ertapenem 1 gram iv Q 24 H - Monitor renal fxn, C&S, clinical status  - Pharmacy to sign off -- what is planned LOT?  Thank you Okey Regal, PharmD  12/23/2014 9:47 AM

## 2014-12-23 NOTE — Clinical Social Work Placement (Signed)
   CLINICAL SOCIAL WORK PLACEMENT  NOTE  Date:  12/23/2014  Patient Details  Name: Allison SallesRobin M [name] MRN: [id number] Date of Birth: 03-18-1961  Clinical Social Work is seeking post-discharge placement for this patient at the Skilled  Nursing Facility level of care (*CSW will initial, date and re-position this form in  chart as items are completed):  Yes   Patient/family provided with [facility] Clinical Social Work Department's list of facilities offering this level of care within the geographic area requested by the patient (or if unable, by the patient's family).  Yes   Patient/family informed of their freedom to choose among providers that offer the needed level of care, that participate in Medicare, Medicaid or managed care program needed by the patient, have an available bed and are willing to accept the patient.  Yes   Patient/family informed of [facility]'s ownership interest in [facility] and [facility], as well as of the fact that they are under no obligation to receive care at these facilities.  PASRR submitted to EDS on 12/23/14     PASRR number received on 12/23/14     Existing PASRR number confirmed on       FL2 transmitted to all facilities in geographic area requested by pt/family on 12/23/14     FL2 transmitted to all facilities within larger geographic area on       Patient informed that his/her managed care company has contracts with or will negotiate with certain facilities, including the following:            Patient/family informed of bed offers received.  Patient chooses bed at       Physician recommends and patient chooses bed at      Patient to be transferred to   on  .  Patient to be transferred to facility by       Patient family notified on   of transfer.  Name of family member notified:        PHYSICIAN Please sign FL2     Additional Comment:    _______________________________________________ Darleene CleaverAnterhaus, Shoji Pertuit R, LCSWA 12/23/2014,  10:37 AM

## 2014-12-23 NOTE — Care Management Note (Signed)
Case Management Note  Patient Details  Name: Allison SallesRobin M [name] MRN: [id number] Date of Birth: [birth year]  Subjective/Objective:                    Action/Plan: UR updated   Expected Discharge Date: 12-26-14              Expected Discharge Plan:  Skilled Nursing Facility  In-House Referral:  Clinical Social Work  Discharge planning Services     Post Acute Care Choice:    Choice offered to:     DME Arranged:    DME Agency:     HH Arranged:    HH Agency:     Status of Service:  In process, will continue to follow  Medicare Important Message Given:    Date Medicare IM Given:    Medicare IM give by:    Date Additional Medicare IM Given:    Additional Medicare Important Message give by:     If discussed at Long Length of Stay Meetings, dates discussed:    Additional Comments:  Kingsley PlanWile, Pa Tennant Marie, RN 12/23/2014, 1:59 PM

## 2014-12-23 NOTE — Significant Event (Signed)
Patient ambulated at least 20 feet, then taken on wheelchair to 6N. Ambulated very well, steady on her feet but became SOB. VS stable prior to transfer, saturations 95% during ambulation. Patient's belongings (pictures of family) taken with her. Family called but no answered. Voice message was full, therefore, RN unable to leave message to let family know. [facility] and NT April. Report given to receiving RN in 6N.

## 2014-12-23 NOTE — Progress Notes (Signed)
Physical Therapy Treatment Patient Details Name: Jeni SallesRobin M Zipper MRN: [id number] DOB: 08-12-1960 Today's Date: 12/23/2014    History of Present Illness [age] yof who was admitted 6/14with 2 night h/o perforated appendicitis. She appeared to improve initially with abx and resuscitation.  Then on 6/17 she had more pain and went for emergent exploration, found to have severe peritonitis and evidence of bowel ischemia. She underwent appendectomy, SB resection.  6/19 reanastomosis, open abd vac; 6/21 partial abd closure -vac 6/25 extubated PMHx- bipolar/depression, DM, hypotension    PT Comments    Pt remains confused and needing frequent cueing for safety and participation.  Pt able to transfer to commode and then only ambulated 4' with RW.  Continue to feel pt will need SNF at D/C.  Will continue to follow.    Follow Up Recommendations  SNF     Equipment Recommendations  Rolling walker with 5" wheels (Bariatric)    Recommendations for Other Services       Precautions / Restrictions Precautions Precautions: Fall Precaution Comments: Wound Vac Restrictions Weight Bearing Restrictions: No    Mobility  Bed Mobility Overal bed mobility: Needs Assistance Bed Mobility: Supine to Sit     Supine to sit: Min guard     General bed mobility comments: pt impulsive and begins coming to sitting at EOB after PT mentions getting up.  Cues to attend to task and safety.    Transfers Overall transfer level: Needs assistance Equipment used: Rolling walker (2 wheeled) Transfers: Sit to/from [employer] CorporationStand;Stand Pivot Transfers Sit to Stand: Min assist Stand pivot transfers: Min assist;+2 safety/equipment       General transfer comment: cues for UE use and at times hand over hand for safe technique.    Ambulation/Gait Ambulation/Gait assistance: Min assist;+2 safety/equipment Ambulation Distance (Feet): 4 Feet Assistive device: Rolling walker (2 wheeled) Gait Pattern/deviations: Step-through  pattern;Decreased stride length;Shuffle;Trunk flexed     General Gait Details: pt moves slowly and takes very short steps.  pt needed encouragement to ambulate and fatigues quickly.     Stairs            Wheelchair Mobility    Modified Rankin (Stroke Patients Only)       Balance Overall balance assessment: Needs assistance Sitting-balance support: No upper extremity supported;Feet supported Sitting balance-Leahy Scale: Good     Standing balance support: Bilateral upper extremity supported;During functional activity Standing balance-Leahy Scale: Poor                      Cognition Arousal/Alertness: Awake/alert Behavior During Therapy: Flat affect;Impulsive Overall Cognitive Status: Impaired/Different from baseline Area of Impairment: Orientation;Attention;Memory;Following commands;Safety/judgement;Awareness;Problem solving Orientation Level: Disoriented to;Time Current Attention Level: Sustained   Following Commands: Follows multi-step commands with increased time Safety/Judgement: Decreased awareness of safety;Decreased awareness of deficits Awareness: Intellectual Problem Solving: Slow processing;Decreased initiation;Difficulty sequencing;Requires verbal cues;Requires tactile cues General Comments: pt needs increased time for processing and following directions.  pt needs cues for staying on topic.      Exercises      General Comments        Pertinent Vitals/Pain Pain Assessment: Faces Faces Pain Scale: Hurts even more Pain Location: Abdomen Pain Descriptors / Indicators: Grimacing;Guarding Pain Intervention(s): Monitored during session;Repositioned;Patient requesting pain meds-RN notified    Home Living                      Prior Function            PT Goals (  current goals can now be found in the care plan section) Acute Rehab PT Goals Patient Stated Goal: go home PT Goal Formulation: With patient Time For Goal Achievement:  01/02/15 Potential to Achieve Goals: Good Progress towards PT goals: Progressing toward goals    Frequency  Min 2X/week    PT Plan Frequency needs to be updated    Co-evaluation             End of Session Equipment Utilized During Treatment: Gait belt Activity Tolerance: Patient limited by fatigue Patient left: in chair;with call bell/phone within reach;with nursing/sitter in room     Time: 1610-9604 PT Time Calculation (min) (ACUTE ONLY): 16 min  Charges:  $Gait Training: 8-22 mins                    G CodesSunny Schlein, Jeffers [phone] 12/23/2014, 3:10 PM

## 2014-12-23 NOTE — Significant Event (Signed)
Attempted to call report in 6N twice. Receiving RN not available. Will attempt again.

## 2014-12-23 NOTE — Progress Notes (Signed)
Pt transferred to 6N26 via wheelchair from 3S04.  Pt AAO X4.  Pt on RA.  Pt has Dbl PICC to RUA with fluids infusing, TPN @50  and fat emulsion @ 10.  Pt has MLA incision with wound vac in place.  Safety sitter to bedside.  [facility].  Pt has no questions at the moment.  Will continue to monitor.

## 2014-12-23 NOTE — Progress Notes (Signed)
Patient ID: Allison [name], female   DOB: 05-24-1961, 54 y.o.   MRN: [id number] 7 Days Post-Op  Subjective: Pt c/o some pain, but just received pain meds.  Tolerating full liquids.  Still having BMs  Objective: Vital signs in last 24 hours: Temp:  [97.3 F (36.3 C)-99.8 F (37.7 C)] 99.8 F (37.7 C) (06/30 0759) Pulse Rate:  [66-97] 86 (06/30 0759) Resp:  [14-24] 15 (06/30 0759) BP: (122-159)/(50-75) 129/68 mmHg (06/30 0759) SpO2:  [93 %-100 %] 97 % (06/30 0759) Last BM Date: 12/22/14  Intake/Output from previous day: 06/29 0701 - 06/30 0700 In: 4141.7 [P.O.:980; IV Piggyback:250; TPN:2911.7] Out: -  Intake/Output this shift: Total I/O In: 500.5 [TPN:500.5] Out: -   PE: Abd: soft, obese, appropriately tender, greater in upper abdomen, +BS, wound VAC in place Heart: regular Lungs: CTAB  Lab Results:   Recent Labs  12/22/14 0515  WBC 10.4  HGB 8.0*  HCT 26.0*  PLT 610*   BMET  Recent Labs  12/21/14 0400 12/22/14 0515  NA 139 139  K 3.4* 3.5  CL 97* 100*  CO2 33* 30  GLUCOSE 173* 158*  BUN 13 12  CREATININE 0.59 0.58  CALCIUM 8.4* 8.4*   PT/INR No results for input(s): LABPROT, INR in the last 72 hours. CMP     Component Value Date/Time   NA 139 12/22/2014 0515   K 3.5 12/22/2014 0515   CL 100* 12/22/2014 0515   CO2 30 12/22/2014 0515   GLUCOSE 158* 12/22/2014 0515   BUN 12 12/22/2014 0515   CREATININE 0.58 12/22/2014 0515   CALCIUM 8.4* 12/22/2014 0515   PROT 5.8* 12/20/2014 0410   ALBUMIN 1.6* 12/20/2014 0410   AST 28 12/20/2014 0410   ALT 30 12/20/2014 0410   ALKPHOS 84 12/20/2014 0410   BILITOT 0.2* 12/20/2014 0410   GFRNONAA >60 12/22/2014 0515   GFRAA >60 12/22/2014 0515   Lipase     Component Value Date/Time   LIPASE <10* 12/07/2014 1930       Studies/Results: No results found.  Anti-infectives: Anti-infectives    Start     Dose/Rate Route Frequency Ordered Stop   12/17/14 1000  fluconazole (DIFLUCAN) IVPB 400 mg     400 mg 100 mL/hr over 120 Minutes Intravenous Every 24 hours 12/17/14 0841     12/13/14 2200  vancomycin (VANCOCIN) IVPB 1000 mg/200 mL premix  Status:  Discontinued     1,000 mg 200 mL/hr over 60 Minutes Intravenous Every 8 hours 12/13/14 1429 12/15/14 0901   12/11/14 1400  vancomycin (VANCOCIN) IVPB 1000 mg/200 mL premix  Status:  Discontinued     1,000 mg 200 mL/hr over 60 Minutes Intravenous Every 12 hours 12/11/14 1300 12/13/14 1429   12/10/14 1700  vancomycin (VANCOCIN) 1,500 mg in sodium chloride 0.9 % 500 mL IVPB  Status:  Discontinued     1,500 mg 250 mL/hr over 120 Minutes Intravenous Every 24 hours 12/10/14 1601 12/11/14 1300   12/09/14 1100  ertapenem (INVANZ) 1 g in sodium chloride 0.9 % 50 mL IVPB  Status:  Discontinued    Comments:  She has hives from pcn before, the cross reactivity is low and I think there is some benefit to hopefully avoiding surgery in this patient without other good choices for abx, I think cipro/flagyl has too much resistance and she has perforation   1 g 100 mL/hr over 30 Minutes Intravenous Every 24 hours 12/09/14 0940 12/09/14 1017   12/09/14 1100  ertapenem (INVANZ) 1  g in sodium chloride 0.9 % 50 mL IVPB  Status:  Discontinued    Comments:  She has hives from pcn before, the cross reactivity is low and I think there is some benefit to hopefully avoiding surgery in this patient without other good choices for abx, I think cipro/flagyl has too much resistance and she has perforation   1 g 100 mL/hr over 30 Minutes Intravenous Every 24 hours 12/09/14 1017 12/09/14 1018   12/09/14 1100  ertapenem (INVANZ) 1 g in sodium chloride 0.9 % 50 mL IVPB    Comments:  She has hives from pcn before, the cross reactivity is low and I think there is some benefit to hopefully avoiding surgery in this patient without other good choices for abx, I think cipro/flagyl has too much resistance and she has perforation   1 g 100 mL/hr over 30 Minutes Intravenous Every 24 hours  12/09/14 1020     12/09/14 0945  ertapenem (INVANZ) injection 1 g  Status:  Discontinued    Comments:  She has hives from pcn before, the cross reactivity is low and I think there is some benefit to hopefully avoiding surgery in this patient without other good choices for abx, I think cipro/flagyl has too much resistance and she has perforation   1 g Intramuscular Every 24 hours 12/09/14 0937 12/09/14 0941   12/08/14 0800  ciprofloxacin (CIPRO) IVPB 400 mg  Status:  Discontinued     400 mg 200 mL/hr over 60 Minutes Intravenous Every 12 hours 12/08/14 0652 12/09/14 0933   12/08/14 0800  metroNIDAZOLE (FLAGYL) IVPB 500 mg  Status:  Discontinued     500 mg 100 mL/hr over 60 Minutes Intravenous Every 8 hours 12/08/14 0652 12/09/14 0933   12/08/14 0330  piperacillin-tazobactam (ZOSYN) IVPB 3.375 g     3.375 g 100 mL/hr over 30 Minutes Intravenous  Once 12/08/14 0328 12/08/14 0513       Assessment/Plan   POD 13, s/p appendectomy, small bowel resection, application of abdominal wound VAC S/p multiple takebacks for open abdomen and closure -advance to carb mod diet -cont PT for mobilization and mobilize in halls TID -cont pulm toilet -Invanz D14/14 -VAC change MWF -confusion is improved some today, just slow mentation, wonder if this may be part of her baseline DVT prophylaxis -scds/heparin Dispo- Patent attorneyContinue safety sitter, tx to floor today.  Spoke to SW about starting SNF search   LOS: 15 days    Clever Geraldo E 12/23/2014, 10:16 AM Pager: [phone]

## 2014-12-23 NOTE — Significant Event (Signed)
Patient alert/oriented X 3, pleasant, converses with staff. Does not know name of this hospital but knows she is in the hospital, knows the date and situation accurately.   RN removed soft waist restraint. Sitter remains at bedside. Will continue to monitor patient closely. Login [place], Charity fundraiserN.

## 2014-12-23 NOTE — Progress Notes (Signed)
Discussed in LOS meeting. 

## 2014-12-24 LAB — GLUCOSE, CAPILLARY
GLUCOSE-CAPILLARY: 150 mg/dL — AB (ref 65–99)
GLUCOSE-CAPILLARY: 168 mg/dL — AB (ref 65–99)
Glucose-Capillary: 154 mg/dL — ABNORMAL HIGH (ref 65–99)
Glucose-Capillary: 218 mg/dL — ABNORMAL HIGH (ref 65–99)

## 2014-12-24 MED ORDER — CILOSTAZOL 100 MG PO TABS
100.0000 mg | ORAL_TABLET | Freq: Two times a day (BID) | ORAL | Status: DC
  Administered 2014-12-24 – 2014-12-25 (×3): 100 mg via ORAL
  Filled 2014-12-24 (×4): qty 1

## 2014-12-24 MED ORDER — OLMESARTAN-AMLODIPINE-HCTZ 40-5-25 MG PO TABS: 1.0000 | ORAL_TABLET | Freq: Every day | ORAL | Status: DC

## 2014-12-24 MED ORDER — PAROXETINE HCL ER 37.5 MG PO TB24
37.5000 mg | ORAL_TABLET | Freq: Every day | ORAL | Status: DC
  Administered 2014-12-25: 37.5 mg via ORAL
  Filled 2014-12-24: qty 1

## 2014-12-24 MED ORDER — LUBIPROSTONE 8 MCG PO CAPS
8.0000 ug | ORAL_CAPSULE | Freq: Two times a day (BID) | ORAL | Status: DC
  Administered 2014-12-24 – 2014-12-25 (×2): 8 ug via ORAL
  Filled 2014-12-24 (×4): qty 1

## 2014-12-24 MED ORDER — ATORVASTATIN CALCIUM 10 MG PO TABS
10.0000 mg | ORAL_TABLET | Freq: Every day | ORAL | Status: DC
  Administered 2014-12-24 – 2014-12-25 (×2): 10 mg via ORAL
  Filled 2014-12-24 (×2): qty 1

## 2014-12-24 MED ORDER — LINAGLIPTIN 5 MG PO TABS
5.0000 mg | ORAL_TABLET | Freq: Every day | ORAL | Status: DC
  Administered 2014-12-25: 5 mg via ORAL
  Filled 2014-12-24: qty 1

## 2014-12-24 MED ORDER — FUROSEMIDE 20 MG PO TABS
20.0000 mg | ORAL_TABLET | Freq: Two times a day (BID) | ORAL | Status: DC
  Administered 2014-12-24 – 2014-12-25 (×2): 20 mg via ORAL
  Filled 2014-12-24 (×2): qty 1

## 2014-12-24 MED ORDER — BUPROPION HCL ER (XL) 150 MG PO TB24
150.0000 mg | ORAL_TABLET | Freq: Every day | ORAL | Status: DC
  Administered 2014-12-25: 150 mg via ORAL
  Filled 2014-12-24: qty 1

## 2014-12-24 MED ORDER — PANTOPRAZOLE SODIUM 40 MG PO TBEC
40.0000 mg | DELAYED_RELEASE_TABLET | Freq: Every day | ORAL | Status: DC
Start: 2014-12-25 — End: 2014-12-25
  Administered 2014-12-25: 40 mg via ORAL
  Filled 2014-12-24: qty 1

## 2014-12-24 MED ORDER — HYDROCHLOROTHIAZIDE 25 MG PO TABS
25.0000 mg | ORAL_TABLET | Freq: Every day | ORAL | Status: DC
  Administered 2014-12-24 – 2014-12-25 (×2): 25 mg via ORAL
  Filled 2014-12-24 (×2): qty 1

## 2014-12-24 MED ORDER — POTASSIUM CHLORIDE CRYS ER 20 MEQ PO TBCR
20.0000 meq | EXTENDED_RELEASE_TABLET | Freq: Every day | ORAL | Status: DC
  Administered 2014-12-25: 20 meq via ORAL
  Filled 2014-12-24: qty 1

## 2014-12-24 MED ORDER — EZETIMIBE 10 MG PO TABS
10.0000 mg | ORAL_TABLET | Freq: Every day | ORAL | Status: DC
  Administered 2014-12-24 – 2014-12-25 (×2): 10 mg via ORAL
  Filled 2014-12-24 (×2): qty 1

## 2014-12-24 MED ORDER — IRBESARTAN 300 MG PO TABS
300.0000 mg | ORAL_TABLET | Freq: Every day | ORAL | Status: DC
  Administered 2014-12-24 – 2014-12-25 (×2): 300 mg via ORAL
  Filled 2014-12-24 (×2): qty 1

## 2014-12-24 MED ORDER — CLOPIDOGREL BISULFATE 75 MG PO TABS
75.0000 mg | ORAL_TABLET | Freq: Every day | ORAL | Status: DC
  Administered 2014-12-25: 75 mg via ORAL
  Filled 2014-12-24: qty 1

## 2014-12-24 NOTE — Progress Notes (Signed)
[facility] Progress Note  8 Days Post-Op  Subjective: Pt very loquacious this am, alert and awake.  No N/V, tolerating diet, having BM's and flatus.  Drinking lots of fluids.    Objective: Vital signs in last 24 hours: Temp:  [98.6 F (37 C)-99.8 F (37.7 C)] 98.6 F (37 C) (06/30 1236) Pulse Rate:  [85-100] 88 (07/01 0631) Resp:  [15-25] 17 (07/01 0631) BP: (126-143)/(55-71) 129/55 mmHg (07/01 0631) SpO2:  [92 %-98 %] 97 % (07/01 0631) Last BM Date: 12/22/14  Intake/Output from previous day: 06/30 0701 - 07/01 0700 In: 2758.7 [P.O.:1440; IV Piggyback:250; TPN:1068.7] Out: 375 [Urine:300; Drains:75] Intake/Output this shift:    PE: Gen:  Alert, NAD, pleasant Abd: Soft, NT/ND, +BS, no HSM, midline wound with wound vac, drain canister with serous drainage   Lab Results:   Recent Labs  12/22/14 0515  WBC 10.4  HGB 8.0*  HCT 26.0*  PLT 610*   BMET  Recent Labs  12/22/14 0515 12/23/14 0937  NA 139 138  K 3.5 5.2*  CL 100* 102  CO2 30 25  GLUCOSE 158* 181*  BUN 12 12  CREATININE 0.58 0.60  CALCIUM 8.4* 8.4*   PT/INR No results for input(s): LABPROT, INR in the last 72 hours. CMP     Component Value Date/Time   NA 138 12/23/2014 0937   K 5.2* 12/23/2014 0937   CL 102 12/23/2014 0937   CO2 25 12/23/2014 0937   GLUCOSE 181* 12/23/2014 0937   BUN 12 12/23/2014 0937   CREATININE 0.60 12/23/2014 0937   CALCIUM 8.4* 12/23/2014 0937   PROT 6.1* 12/23/2014 0937   ALBUMIN 1.9* 12/23/2014 0937   AST 30 12/23/2014 0937   ALT 32 12/23/2014 0937   ALKPHOS 103 12/23/2014 0937   BILITOT 0.2* 12/23/2014 0937   GFRNONAA >60 12/23/2014 0937   GFRAA >60 12/23/2014 0937   Lipase     Component Value Date/Time   LIPASE <10* 12/07/2014 1930       Studies/Results: No results found.  Anti-infectives: Anti-infectives    Start     Dose/Rate Route Frequency Ordered Stop   12/17/14 1000  fluconazole (DIFLUCAN) IVPB 400 mg     400 mg 100 mL/hr  over 120 Minutes Intravenous Every 24 hours 12/17/14 0841     12/13/14 2200  vancomycin (VANCOCIN) IVPB 1000 mg/200 mL premix  Status:  Discontinued     1,000 mg 200 mL/hr over 60 Minutes Intravenous Every 8 hours 12/13/14 1429 12/15/14 0901   12/11/14 1400  vancomycin (VANCOCIN) IVPB 1000 mg/200 mL premix  Status:  Discontinued     1,000 mg 200 mL/hr over 60 Minutes Intravenous Every 12 hours 12/11/14 1300 12/13/14 1429   12/10/14 1700  vancomycin (VANCOCIN) 1,500 mg in sodium chloride 0.9 % 500 mL IVPB  Status:  Discontinued     1,500 mg 250 mL/hr over 120 Minutes Intravenous Every 24 hours 12/10/14 1601 12/11/14 1300   12/09/14 1100  ertapenem (INVANZ) 1 g in sodium chloride 0.9 % 50 mL IVPB  Status:  Discontinued    Comments:  She has hives from pcn before, the cross reactivity is low and I think there is some benefit to hopefully avoiding surgery in this patient without other good choices for abx, I think cipro/flagyl has too much resistance and she has perforation   1 g 100 mL/hr over 30 Minutes Intravenous Every 24 hours 12/09/14 0940 12/09/14 1017   12/09/14 1100  ertapenem (INVANZ) 1 g in sodium  chloride 0.9 % 50 mL IVPB  Status:  Discontinued    Comments:  She has hives from pcn before, the cross reactivity is low and I think there is some benefit to hopefully avoiding surgery in this patient without other good choices for abx, I think cipro/flagyl has too much resistance and she has perforation   1 g 100 mL/hr over 30 Minutes Intravenous Every 24 hours 12/09/14 1017 12/09/14 1018   12/09/14 1100  ertapenem (INVANZ) 1 g in sodium chloride 0.9 % 50 mL IVPB    Comments:  She has hives from pcn before, the cross reactivity is low and I think there is some benefit to hopefully avoiding surgery in this patient without other good choices for abx, I think cipro/flagyl has too much resistance and she has perforation   1 g 100 mL/hr over 30 Minutes Intravenous Every 24 hours 12/09/14 1020  12/23/14 1153   12/09/14 0945  ertapenem (INVANZ) injection 1 g  Status:  Discontinued    Comments:  She has hives from pcn before, the cross reactivity is low and I think there is some benefit to hopefully avoiding surgery in this patient without other good choices for abx, I think cipro/flagyl has too much resistance and she has perforation   1 g Intramuscular Every 24 hours 12/09/14 0937 12/09/14 0941   12/08/14 0800  ciprofloxacin (CIPRO) IVPB 400 mg  Status:  Discontinued     400 mg 200 mL/hr over 60 Minutes Intravenous Every 12 hours 12/08/14 0652 12/09/14 0933   12/08/14 0800  metroNIDAZOLE (FLAGYL) IVPB 500 mg  Status:  Discontinued     500 mg 100 mL/hr over 60 Minutes Intravenous Every 8 hours 12/08/14 0652 12/09/14 0933   12/08/14 0330  piperacillin-tazobactam (ZOSYN) IVPB 3.375 g     3.375 g 100 mL/hr over 30 Minutes Intravenous  Once 12/08/14 0328 12/08/14 0513       Assessment/Plan POD #14, 12, 10, 8 s/p appendectomy, small bowel resection, application of abdominal wound VAC S/p multiple takebacks for open abdomen and closure -Carb mod diet -cont PT for mobilization and mobilize in halls TID -cont pulm toilet -Invanz/diflucan D14/14, now discontinued -VAC change MWF, Will need wound vac at discharge -Confusion is improved much improved, she is much more oriented today and very loquacious -Limit ultram, use tylenol first -Limit fluids as she's likely drinking too much and needing to go to the bathroom too frequently. One pitcher every 4 hours. DVT prophylaxis -scds/heparin Dispo- -Try to d/c safety sitter this am, consider moving patient closer to nurses station and putting on bed alarm. SW for SNF placement tomorrow?     LOS: 16 days    Nonie HoyerMegan N Ry Moody 12/24/2014, 7:38 AM Pager: [phone]

## 2014-12-24 NOTE — Progress Notes (Signed)
Pt refusing CPAP for tonight 

## 2014-12-24 NOTE — Progress Notes (Signed)
Nutrition Follow-up  DOCUMENTATION CODES:  Morbid obesity  INTERVENTION:  Resource Breeze po TID, each supplement provides 250 kcal and 9 grams of protein  NUTRITION DIAGNOSIS:  Increased nutrient needs related to  (wound/post-op healing) as evidenced by estimated needs.  Ongoing  GOAL:  Patient will meet greater than or equal to 90% of their needs  Progressing  MONITOR:  PO intake, Supplement acceptance, Labs, Weight trends, Skin, I & O's  REASON FOR ASSESSMENT:  Ventilator    ASSESSMENT: [age] yof who was admitted 6/14 W/ 2 night h/o perforated appendicitis. She appeared to improve initially with abx and resuscitation Then on 6/17 she had more pain, emesis, no more bowel function and her abdomen was more tender. Her wbc was rising. She went for emergent exploration, found to have severe peritonitis as well as evidence of bowel ischemia. She underwent appendectomy, SB resection and was left in discontinuity. Returned to the ICU on vent. PCCM asked to assist w/ care.   Patient s/p procedures 6/17: APPENDECTOMY SMALL BOWEL RESECTION APPLICATION OF ABDOMINAL WOUND VAC  Pt transferred to surgical floor on 12/23/14.   TPN d/c on 12/23/14. Pt has been advanced to a carb modified diet. Meal completion 50%. Pt continues to consume Resource breeze supplements; RD will continue supplement due to increased needs for wound healing.   Pt remains with abdominal vac; noted 75 ml output on 12/24/14. Pt due to dressing change today per COWRN.   CSW following for placement. Per surgical PA, potential for d/c to SNF tomorrow.   Labs reviewed. K: 5.2.   Height:  Ht Readings from Last 1 Encounters:  12/21/14 5\' 2"  (1.575 m)    Weight:  Wt Readings from Last 1 Encounters:  12/21/14 281 lb 1.4 oz (127.5 kg)    Ideal Body Weight:  52.3 kg  Wt Readings from Last 10 Encounters:  12/21/14 281 lb 1.4 oz (127.5 kg)  09/09/14 282 lb (127.914 kg)  06/10/14 283 lb 9.6 oz (128.64 kg)   03/23/14 279 lb (126.554 kg)  12/15/13 276 lb 3.2 oz (125.283 kg)  09/14/13 280 lb 6.4 oz (127.189 kg)  06/16/13 279 lb 3.2 oz (126.644 kg)  12/15/12 277 lb 9.6 oz (125.919 kg)  05/16/10 211 lb (95.709 kg)  05/09/10 205 lb (92.987 kg)    BMI:  Body mass index is 51.4 kg/(m^2).  Estimated Nutritional Needs:  Kcal:  1900-2100  Protein:  120-130 gm  Fluid:  per MD  Skin:  Wound (see comment) (post-op full thickness )  Diet Order:  Diet Carb Modified Fluid consistency:: Thin; Room service appropriate?: Yes  EDUCATION NEEDS:  No education needs identified at this time   Intake/Output Summary (Last 24 hours) at 12/24/14 1123 Last data filed at 12/24/14 1100  Gross per 24 hour  Intake 1448.17 ml  Output    826 ml  Net 622.17 ml    Last BM:  12/24/14  Beren Yniguez A. Mayford KnifeWilliams, RD, LDN, CDE Pager: [phone] After hours Pager: [phone]

## 2014-12-24 NOTE — Clinical Social Work Note (Signed)
[facility]. Patient's daughter, Marchelle Folksmanda, updated regarding discharge. Per patient's daughter, patient's husband understanding and agreeable to discharge plan.  CSW to provide weekend CSW with detailed handoff for 7/2.  Marcelline Deistmily Verdella Laidlaw, [place] - [phone] Clinical Social Work Department Orthopedics [phone]([id number]) and Surgical [phone]([id number])

## 2014-12-24 NOTE — Progress Notes (Signed)
Patient has been alert and oriented however when she falls asleep, she will jump up out of sleep and try to get out of bed to use the bathroom/BSC without calling for assistance.  Safety sitter has been very instrumental during this shift.

## 2014-12-24 NOTE — Care Management (Signed)
[facility] tomorrow [facility] . Mal AmabileBrock at   [facility] requesting [employer] V[facility] . Ricki with [employer] will have VAC delivered to patient's hospital room today . Tomorrow before discharge bedside nurse can apply VAC to patient and patient will be transported with VAC . [facility] .   Ronny FlurryHeather Rosco Harriott RN BSN [phone]

## 2014-12-25 LAB — GLUCOSE, CAPILLARY
Glucose-Capillary: 149 mg/dL — ABNORMAL HIGH (ref 65–99)
Glucose-Capillary: 190 mg/dL — ABNORMAL HIGH (ref 65–99)

## 2014-12-25 MED ORDER — TRAMADOL HCL 50 MG PO TABS: 50.0000 mg | ORAL_TABLET | Freq: Four times a day (QID) | ORAL | Status: DC | PRN

## 2014-12-25 NOTE — Discharge Summary (Signed)
[facility] Discharge Summary   Patient ID: Allison [name] MRN: [id number] DOB/AGE: 25-Nov-1960 54 y.o.  Admit date: 12/07/2014 Discharge date: 12/25/2014  Admitting Diagnosis: Acute ruptured appendicitis Other medical problems as below  Discharge Diagnosis Patient Active Problem List   Diagnosis Date Noted  . Appendicitis   . Pressure ulcer 12/16/2014  . Acute respiratory failure with hypoxia 12/11/2014  . Perforated appendicitis 12/08/2014  . Severe sepsis with acute organ dysfunction 12/08/2014  . Acute kidney failure 12/08/2014  . Hypokalemia 12/08/2014  . Morbid obesity 12/08/2014  . 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  . DM type 2, uncontrolled, 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  . CORONARY ATHEROSCLEROSIS, NATIVE VESSEL 01/18/2010  . DENTAL CARIES 01/18/2010  . HYPERTENSION, WHITE COAT 01/18/2010    Consultants WOC nursing Dr. Sung Amabile - CCM Dr. Julian Reil - IM  Imaging: No results found.  Procedures Dr Carolynne Edouard - 12/16/14 - Ex Lap, wound closure Dr. Carolynne Edouard - 12/14/14 - Vacuum change, partial closure of abdomen Dr. Derrell Lolling - 12/12/14 -  Ex Lap, re-anastamosed small bowel, open abdominal wound vac placement Dr. Dwain Sarna - 12/10/14 - Appendectomy, small bowel resection, application of wound vac  Hospital Course:  54 y/o white female with h/o multiple medical problems including bipolar/anxiety/depression presented to [facility] with 2 day h/o abdominal pain, diarrhea, and N/V.  Pain started Sunday diffuse periumbilical crampy. Has worsened and has had diarrhea as well. CT shows pelvic inflammation and probable perforated appendix with peritonitis.   Patient was admitted and was transferred to the SDU for close monitoring.  She as initially treated conservatively, but this was unsuccessful.  She required 4 operations to close her abdomen including appendectomy and open-abdominal vac, with small bowel resection.  Post-operatively she die have respiratory failure and was  managed in the ICU.  CCM helped [place] manager her on the vent.  She as able to be weaned and eventually extubated on 12/18/14.  She was transferred to the SDU on 12/21/14.  She was managed on TPN while NPO.  IV antibiotics for 14 days given.  A wound vac was applied to her midline wound.  She will continue this at discharge.  As her bowel function resumed her NG was discontinued and diet was advanced as tolerated.  On 12/21/14 she was disoriented and had an unwitnessed fall out of bed onto the floor.  Luckily she did not sustain any injuries.  After this she required a sitter for several days.  Once bowel function resumed she was started back on her psych medications and pain meds were limited to prevent delirium/confusion.  Physical therapy recommended SNF at discharge and the patient was receptive to that.  She was transferred to the floor on 12/23/14.  On HD #18, the patient was voiding well, tolerating diet, ambulating well, pain well controlled, vital signs stable, incisions c/d/i and Dr. Donell Beers saw and felt stable for discharge to [facility] on 7/2.  Patient will follow up in our office in 3 weeks with Dr. Dwain Sarna and knows to call with questions or concerns.  Continue wound vac changes M/W/F black foam over a layer of Mepitel contact dressing to protect wound bed.  Diet:  Carb modified diet Home meds:  Resume all home meds, but be cautious about blood sugars as we have been holding her insulin and maintaining her on Sliding scale insulin only.     Medication List    STOP taking these medications        HYDROcodone-acetaminophen 10-325 MG per tablet  Commonly known as:  NORCO      TAKE these medications        acetaminophen 500 MG tablet  Commonly known as:  TYLENOL  Take 1,000 mg by mouth every 6 (six) hours as needed for mild pain.     albuterol 108 (90 BASE) MCG/ACT inhaler  Commonly known as:  PROVENTIL HFA;VENTOLIN HFA  Inhale 2 puffs into the lungs every 6 (six) hours as  needed for wheezing or shortness of breath.     AMITIZA 8 MCG capsule  Generic drug:  lubiprostone  Take 8 mcg by mouth 2 (two) times daily with a meal.     aspirin EC 81 MG tablet  Take 81 mg by mouth every other day.     atorvastatin 10 MG tablet  Commonly known as:  LIPITOR  Take 10 mg by mouth daily.     buPROPion 150 MG 24 hr tablet  Commonly known as:  WELLBUTRIN XL  Take 1 tablet (150 mg total) by mouth daily.     cilostazol 100 MG tablet  Commonly known as:  PLETAL  Take 100 mg by mouth 2 (two) times daily.     clopidogrel 75 MG tablet  Commonly known as:  PLAVIX  Take 75 mg by mouth daily.     furosemide 20 MG tablet  Commonly known as:  LASIX  Take 20 mg by mouth 2 (two) times daily.     insulin glargine 100 UNIT/ML injection  Commonly known as:  LANTUS  Inject 110 Units into the skin at bedtime.     insulin glulisine 100 UNIT/ML injection  Commonly known as:  APIDRA  Inject 50 Units into the skin 3 (three) times daily.     nitroGLYCERIN 0.4 MG SL tablet  Commonly known as:  NITROSTAT  Place 0.4 mg under the tongue every 5 (five) minutes as needed for chest pain. May repeat for up to 3 doses.     omeprazole 40 MG capsule  Commonly known as:  PRILOSEC  Take 40 mg by mouth 2 (two) times daily.     PARoxetine 37.5 MG 24 hr tablet  Commonly known as:  PAXIL-CR  Take 1 tablet (37.5 mg total) by mouth daily.     potassium chloride SA 20 MEQ tablet  Commonly known as:  K-DUR,KLOR-CON  Take 20 mEq by mouth daily.     ranolazine 1000 MG SR tablet  Commonly known as:  RANEXA  Take 500 mg by mouth 2 (two) times daily.     sitaGLIPtin 100 MG tablet  Commonly known as:  JANUVIA  Take 100 mg by mouth daily.     traMADol 50 MG tablet  Commonly known as:  ULTRAM  Take 1-2 tablets (50-100 mg total) by mouth every 6 (six) hours as needed for moderate pain or severe pain.     TRIBENZOR 40-5-25 MG Tabs  Generic drug:  Olmesartan-Amlodipine-HCTZ  Take 1 tablet  by mouth daily.     TRILIPIX 135 MG capsule  Generic drug:  Choline Fenofibrate  Take 135 mg by mouth daily.     Vitamin D (Ergocalciferol) 50000 UNITS Caps capsule  Commonly known as:  DRISDOL  Take 50,000 Units by mouth every 7 (seven) days.     ZETIA 10 MG tablet  Generic drug:  ezetimibe  Take 10 mg by mouth daily.         Follow-up Information    [facility]. Schedule an appointment as soon as possible  for a visit in 3 weeks.   Specialty:  General Surgery   Why:  For post-hospital follow up, please call to confirm date and time of appointment with Dr. Dwain Sarna in 3 weeks from now   Contact information:   [address] [place] [address] [phone]       Signed: Nonie Hoyer, [facility] [phone]  12/25/2014, 11:52 AM

## 2014-12-25 NOTE — Progress Notes (Addendum)
Called report to AlpineZack, Charity fundraiserN at [facility]discharge packet and discharge summary to accompany patient to skilled nursing facility. [facility]

## 2014-12-25 NOTE — Progress Notes (Signed)
Patient for d/c today to [facility]. Family (daughter and husband) and patient agreeable to this plan- plan transfer via EMS. Reece LevyJanet Amitai Delaughter, MSW, Theresia MajorsLCSWA [phone]

## 2014-12-25 NOTE — Progress Notes (Signed)
Patient ID: Allison [name], female   DOB: 06/20/1961, 54 y.o.   MRN: [id number] [facility] Surgery Progress Note  9 Days Post-Op  Subjective: Pt states pain is tolerable.    Objective: Vital signs in last 24 hours: Temp:  [99.1 F (37.3 C)-99.6 F (37.6 C)] 99.1 F (37.3 C) (07/02 0618) Pulse Rate:  [89-91] 91 (07/02 0618) Resp:  [20] 20 (07/02 0618) BP: (120-140)/(53-62) 120/53 mmHg (07/02 0618) SpO2:  [93 %-96 %] 93 % (07/02 0618) Last BM Date: 12/24/14  Intake/Output from previous day: 07/01 0701 - 07/02 0700 In: 1340 [P.O.:1320; I.V.:20] Out: 4311 [Urine:3950; Drains:10; Stool:351] Intake/Output this shift: Total I/O In: -  Out: 100 [Urine:100]  PE: Gen:  Alert, NAD, pleasant Abd: Soft, NT/ND, +BS, no HSM, midline wound with wound vac, drain canister with serous drainage   Lab Results:  No results for input(s): WBC, HGB, HCT, PLT in the last 72 hours. BMET  Recent Labs  12/23/14 0937  NA 138  K 5.2*  CL 102  CO2 25  GLUCOSE 181*  BUN 12  CREATININE 0.60  CALCIUM 8.4*   PT/INR No results for input(s): LABPROT, INR in the last 72 hours. CMP     Component Value Date/Time   NA 138 12/23/2014 0937   K 5.2* 12/23/2014 0937   CL 102 12/23/2014 0937   CO2 25 12/23/2014 0937   GLUCOSE 181* 12/23/2014 0937   BUN 12 12/23/2014 0937   CREATININE 0.60 12/23/2014 0937   CALCIUM 8.4* 12/23/2014 0937   PROT 6.1* 12/23/2014 0937   ALBUMIN 1.9* 12/23/2014 0937   AST 30 12/23/2014 0937   ALT 32 12/23/2014 0937   ALKPHOS 103 12/23/2014 0937   BILITOT 0.2* 12/23/2014 0937   GFRNONAA >60 12/23/2014 0937   GFRAA >60 12/23/2014 0937   Lipase     Component Value Date/Time   LIPASE <10* 12/07/2014 1930       Studies/Results: No results found.  Anti-infectives: Anti-infectives    Start     Dose/Rate Route Frequency Ordered Stop   12/17/14 1000  fluconazole (DIFLUCAN) IVPB 400 mg  Status:  Discontinued     400 mg 100 mL/hr over 120 Minutes  Intravenous Every 24 hours 12/17/14 0841 12/24/14 0823   12/13/14 2200  vancomycin (VANCOCIN) IVPB 1000 mg/200 mL premix  Status:  Discontinued     1,000 mg 200 mL/hr over 60 Minutes Intravenous Every 8 hours 12/13/14 1429 12/15/14 0901   12/11/14 1400  vancomycin (VANCOCIN) IVPB 1000 mg/200 mL premix  Status:  Discontinued     1,000 mg 200 mL/hr over 60 Minutes Intravenous Every 12 hours 12/11/14 1300 12/13/14 1429   12/10/14 1700  vancomycin (VANCOCIN) 1,500 mg in sodium chloride 0.9 % 500 mL IVPB  Status:  Discontinued     1,500 mg 250 mL/hr over 120 Minutes Intravenous Every 24 hours 12/10/14 1601 12/11/14 1300   12/09/14 1100  ertapenem (INVANZ) 1 g in sodium chloride 0.9 % 50 mL IVPB  Status:  Discontinued    Comments:  She has hives from pcn before, the cross reactivity is low and I think there is some benefit to hopefully avoiding surgery in this patient without other good choices for abx, I think cipro/flagyl has too much resistance and she has perforation   1 g 100 mL/hr over 30 Minutes Intravenous Every 24 hours 12/09/14 0940 12/09/14 1017   12/09/14 1100  ertapenem (INVANZ) 1 g in sodium chloride 0.9 % 50 mL IVPB  Status:  Discontinued    Comments:  She has hives from pcn before, the cross reactivity is low and I think there is some benefit to hopefully avoiding surgery in this patient without other good choices for abx, I think cipro/flagyl has too much resistance and she has perforation   1 g 100 mL/hr over 30 Minutes Intravenous Every 24 hours 12/09/14 1017 12/09/14 1018   12/09/14 1100  ertapenem (INVANZ) 1 g in sodium chloride 0.9 % 50 mL IVPB    Comments:  She has hives from pcn before, the cross reactivity is low and I think there is some benefit to hopefully avoiding surgery in this patient without other good choices for abx, I think cipro/flagyl has too much resistance and she has perforation   1 g 100 mL/hr over 30 Minutes Intravenous Every 24 hours 12/09/14 1020 12/23/14  1153   12/09/14 0945  ertapenem (INVANZ) injection 1 g  Status:  Discontinued    Comments:  She has hives from pcn before, the cross reactivity is low and I think there is some benefit to hopefully avoiding surgery in this patient without other good choices for abx, I think cipro/flagyl has too much resistance and she has perforation   1 g Intramuscular Every 24 hours 12/09/14 0937 12/09/14 0941   12/08/14 0800  ciprofloxacin (CIPRO) IVPB 400 mg  Status:  Discontinued     400 mg 200 mL/hr over 60 Minutes Intravenous Every 12 hours 12/08/14 0652 12/09/14 0933   12/08/14 0800  metroNIDAZOLE (FLAGYL) IVPB 500 mg  Status:  Discontinued     500 mg 100 mL/hr over 60 Minutes Intravenous Every 8 hours 12/08/14 0652 12/09/14 0933   12/08/14 0330  piperacillin-tazobactam (ZOSYN) IVPB 3.375 g     3.375 g 100 mL/hr over 30 Minutes Intravenous  Once 12/08/14 0328 12/08/14 0513       Assessment/Plan POD #15, 13, 11, 9 s/p appendectomy, small bowel resection, application of abdominal wound VAC S/p multiple takebacks for open abdomen and closure -Carb mod diet -cont PT for mobilization and mobilize in halls TID -cont pulm toilet -Invanz/diflucan D14/14, now discontinued -VAC change MWF, Will need wound vac at discharge -Confusion is improved much improved overall.  Less interactive today.   -Limit ultram, use tylenol first -Limit fluids as she's likely drinking too much and needing to go to the bathroom too frequently. One pitcher every 4 hours. DVT prophylaxis -scds/heparin Dispo- -Try to d/c safety sitter this am, consider moving patient closer to nurses station and putting on bed alarm.SNF when bed available.       LOS: 17 days    Allison [name] 12/25/2014, 11:12 AM

## 2015-02-21 ENCOUNTER — Encounter (HOSPITAL_COMMUNITY): Payer: Self-pay | Admitting: Psychiatry

## 2015-02-21 ENCOUNTER — Ambulatory Visit (INDEPENDENT_AMBULATORY_CARE_PROVIDER_SITE_OTHER): Payer: BC Managed Care – PPO | Admitting: Psychiatry

## 2015-02-21 VITALS — BP 137/75 | HR 92 | Ht 63.0 in | Wt 239.0 lb

## 2015-02-21 DIAGNOSIS — F323 Major depressive disorder, single episode, severe with psychotic features: Secondary | ICD-10-CM

## 2015-02-21 DIAGNOSIS — F330 Major depressive disorder, recurrent, mild: Secondary | ICD-10-CM

## 2015-02-21 MED ORDER — BUPROPION HCL ER (XL) 150 MG PO TB24: 150.0000 mg | ORAL_TABLET | Freq: Every day | ORAL | Status: DC

## 2015-02-21 MED ORDER — PAROXETINE HCL ER 37.5 MG PO TB24: 37.5000 mg | ORAL_TABLET | Freq: Every day | ORAL | Status: DC

## 2015-02-21 NOTE — Progress Notes (Addendum)
[facility] [id number] Progress Note  Allison [name] [id number] 54 y.o.  02/21/2015 3:39 PM  Chief Complaint:  Medication management and followup   History of Present Illness: Allison [name] came for her followup appointment.  She missed appointment in the June.  She was sick and she had issues with her appendix which required surgery.  After the surgery she has complication and she needed to be in rehabilitation .  She is feeling better.  She was noncompliant with psychotic medication for few days and felt very sad depressed and tearful.  Now she is feeling better since she is taking her medication.  She is no longer taking narcotic pain medication.  She still on disability until October 14.  She still endorses there are times when she feels sad and tearful because of her health issues.  However she denies any agitation, anger or any paranoia or any hallucination.  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  She is already driving her car without any problem.  Patient denies any feeling of hopelessness or worthlessness.  She is compliant with Wellbutrin and Paxil.  She has no tremors, shakes or any side effects.  Patient denies drinking or using any illegal substances.  She lives with her husband who is very supportive.   Her primary care physician is Dr. Gilman Buttner at [facility].      Suicidal Ideation: No Plan Formed: No Patient has means to carry out plan: No  Homicidal Ideation: No Plan Formed: No Patient has means to carry out plan: No  Review of Systems: Psychiatric: Agitation: No Hallucination: No Depressed Mood: No Insomnia: No Hypersomnia: No Altered Concentration: No Feels Worthless: No Grandiose Ideas: No Belief In Special Powers: No New/Increased Substance Abuse: No Compulsions: No  Neurologic: Headache: No Seizure: No Paresthesias: No  Past Medical Family, Social History:  Patient has  hyperlipidemia, diabetes mellitus, hypertension, obesity and coronary artery disease.  Her primary care physician is Dr.Clara Katrinka Bl[facility] at [facility].  Outpatient Encounter Prescriptions as of 02/21/2015  Medication Sig  . acetaminophen (TYLENOL) 500 MG tablet Take 1,000 mg by mouth every 6 (six) hours as needed for mild pain.   Marland Kitchen albuterol (PROVENTIL HFA;VENTOLIN HFA) 108 (90 BASE) MCG/ACT inhaler Inhale 2 puffs into the lungs every 6 (six) hours as needed for wheezing or shortness of breath.  Marland Kitchen aspirin EC 81 MG tablet Take 81 mg by mouth every other day.  Marland Kitchen atorvastatin (LIPITOR) 10 MG tablet Take 10 mg by mouth daily.  Marland Kitchen buPROPion (WELLBUTRIN XL) 150 MG 24 hr tablet Take 1 tablet (150 mg total) by mouth daily.  . Choline Fenofibrate (TRILIPIX) 135 MG capsule Take 135 mg by mouth daily.    . cilostazol (PLETAL) 100 MG tablet Take 100 mg by mouth 2 (two) times daily.  . clopidogrel (PLAVIX) 75 MG tablet Take 75 mg by mouth daily.    Marland Kitchen ezetimibe (ZETIA) 10 MG tablet Take 10 mg by mouth daily.  . furosemide (LASIX) 20 MG tablet Take 20 mg by mouth 2 (two) times daily.  . insulin glargine (LANTUS) 100 UNIT/ML injection Inject 110 Units into the skin at bedtime.   . insulin glulisine (APIDRA) 100 UNIT/ML injection Inject 50 Units into the skin 3 (three) times daily.   Marland Kitchen lubiprostone (AMITIZA) 8 MCG capsule Take 8 mcg by mouth 2 (two) times daily with a meal.  . nitroGLYCERIN (NITROSTAT)  0.4 MG SL tablet Place 0.4 mg under the tongue every 5 (five) minutes as needed for chest pain. May repeat for up to 3 doses.  . Olmesartan-Amlodipine-HCTZ (TRIBENZOR) 40-5-25 MG TABS Take 1 tablet by mouth daily.   Marland Kitchen omeprazole (PRILOSEC) 40 MG capsule Take 40 mg by mouth 2 (two) times daily.  Marland Kitchen PARoxetine (PAXIL-CR) 37.5 MG 24 hr tablet Take 1 tablet (37.5 mg total) by mouth daily.  . potassium chloride SA (K-DUR,KLOR-CON) 20 MEQ tablet Take 20 mEq by mouth daily.  . ranolazine (RANEXA) 1000 MG  SR tablet Take 500 mg by mouth 2 (two) times daily.  . sitaGLIPtin (JANUVIA) 100 MG tablet Take 100 mg by mouth daily.    . traMADol (ULTRAM) 50 MG tablet Take 1-2 tablets (50-100 mg total) by mouth every 6 (six) hours as needed for moderate pain or severe pain.  . Vitamin D, Ergocalciferol, (DRISDOL) 50000 UNITS CAPS capsule Take 50,000 Units by mouth every 7 (seven) days.  . [DISCONTINUED] buPROPion (WELLBUTRIN XL) 150 MG 24 hr tablet Take 1 tablet (150 mg total) by mouth daily.  . [DISCONTINUED] PARoxetine (PAXIL-CR) 37.5 MG 24 hr tablet Take 1 tablet (37.5 mg total) by mouth daily.   No facility-administered encounter medications on file as of 02/21/2015.    Past Psychiatric History/Hospitalization(s): Patient denies any history of suicidal and or any inpatient psychiatric treatment.  She admitted history of mood swings, anger and irritability.  She is seen in this office since 2003.  She was given Risperdal however it was discontinued because patient was feeling better. Anxiety: Yes Bipolar Disorder: Yes Depression: Yes Mania: Yes Psychosis: No Schizophrenia: No Personality Disorder: No Hospitalization for psychiatric illness: No History of Electroconvulsive Shock Therapy: No Prior Suicide Attempts: No  Physical Exam: Constitutional:  BP 137/75 mmHg  Pulse 92  Ht 5\' 3"  (1.6 m)  Wt 239 lb (108.41 kg)  BMI 42.35 kg/m2  General Appearance: well nourished and obese  Musculoskeletal: Strength & Muscle Tone: within normal limits Gait & Station: normal Patient leans: N/A  Psychiatric: Speech (describe rate, volume, coherence, spontaneity, and abnormalities if any): Clear and coherent with normal tone and volume.    Thought Process (describe rate, content, abstract reasoning, and computation): Logical and goal directed.  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  Patient is stable on her current medication.  I offer counseling but patient declined.  I will continue Paxil CR 37.5 mg and Wellbutrin XL 150 mg daily.  Discussed medication side effects and benefits.  Recommended to call us back if she has any question, concern or if she feel worsening of the symptom.  I will see her again in 3 months.  ARFEEN,SYED T., MD 02/21/2015

## 2015-04-11 NOTE — Anesthesia Preprocedure Evaluation (Signed)
Anesthesia Evaluation  Patient identified by MRN, date of birth, ID band Patient awake    Reviewed: Allergy & Precautions, NPO status , Unable to perform ROS - Chart review only  Airway Mallampati: II  TM Distance: >3 FB Neck ROM: Full    Dental  (+) Teeth Intact   Pulmonary former smoker,    breath sounds clear to auscultation       Cardiovascular  Rhythm:Regular Rate:Normal     Neuro/Psych    GI/Hepatic   Endo/Other  diabetes  Renal/GU      Musculoskeletal   Abdominal   Peds  Hematology   Anesthesia Other Findings   Reproductive/Obstetrics                             Anesthesia Physical Anesthesia Plan  ASA: III and emergent  Anesthesia Plan: General   Post-op Pain Management:    Induction: Intravenous and Rapid sequence  Airway Management Planned: Oral ETT  Additional Equipment:   Intra-op Plan:   Post-operative Plan: Extubation in OR  Informed Consent: I have reviewed the patients History and Physical, chart, labs and discussed the procedure including the risks, benefits and alternatives for the proposed anesthesia with the patient or authorized representative who has indicated his/her understanding and acceptance.     Plan Discussed with: CRNA and Anesthesiologist  Anesthesia Plan Comments:         Anesthesia Quick Evaluation

## 2015-05-24 ENCOUNTER — Ambulatory Visit (INDEPENDENT_AMBULATORY_CARE_PROVIDER_SITE_OTHER): Payer: BC Managed Care – PPO | Admitting: Psychiatry

## 2015-05-24 ENCOUNTER — Encounter (HOSPITAL_COMMUNITY): Payer: Self-pay | Admitting: Psychiatry

## 2015-05-24 VITALS — BP 123/73 | HR 80 | Ht 62.0 in | Wt 266.2 lb

## 2015-05-24 DIAGNOSIS — F330 Major depressive disorder, recurrent, mild: Secondary | ICD-10-CM | POA: Diagnosis not present

## 2015-05-24 MED ORDER — BUPROPION HCL ER (XL) 150 MG PO TB24: 150.0000 mg | ORAL_TABLET | Freq: Every day | ORAL | Status: DC

## 2015-05-24 MED ORDER — PAROXETINE HCL ER 37.5 MG PO TB24: 37.5000 mg | ORAL_TABLET | Freq: Every day | ORAL | Status: DC

## 2015-05-24 NOTE — Progress Notes (Signed)
[facility] [id number] Progress Note  Allison [name] [id number] 54 y.o.  05/24/2015 11:07 AM  Chief Complaint:  Medication management and followup   History of Present Illness: Allison [name] came for her followup appointment.  She is a stable on her current psychiatric medication.  Occasionally she has not she has but overall she is feeling better.  She is compliant with Paxil and Wellbutrin.  She has no rash, itching, tremors, shakes or any EPS.  She denies any irritability or any anger.  Her depression is a stable.  She admitted some time issues at work but she is able to handle it.  Patient is working as a Surveyor, miningschool bus driver.  She has a very good Thanksgiving.  She denies any crying spells, feeling of hopelessness or worthlessness.  Patient denies drinking or using any illegal substances.  Her energy level is good.  She denies any paranoia or any hallucination.  Patient lives with her husband who is very supportive. Her primary care physician is Dr. Gilman Buttnerlara Smith at [facility].      Suicidal Ideation: No Plan Formed: No Patient has means to carry out plan: No  Homicidal Ideation: No Plan Formed: No Patient has means to carry out plan: No  Review of Systems: Psychiatric: Agitation: No Hallucination: No Depressed Mood: No Insomnia: No Hypersomnia: No Altered Concentration: No Feels Worthless: No Grandiose Ideas: No Belief In Special Powers: No New/Increased Substance Abuse: No Compulsions: No  Neurologic: Headache: No Seizure: No Paresthesias: No  Past Medical Family, Social History:  Patient has hyperlipidemia, diabetes mellitus, hypertension, obesity and coronary artery disease.  Her primary care physician is [facility]Cl[facility]ink[facility].  Outpatient Encounter Prescriptions as of 05/24/2015  Medication Sig  . acetaminophen (TYLENOL) 500 MG tablet Take 1,000 mg by mouth every 6 (six) hours as needed for mild pain.   Marland Kitchen.  albuterol (PROVENTIL HFA;VENTOLIN HFA) 108 (90 BASE) MCG/ACT inhaler Inhale 2 puffs into the lungs every 6 (six) hours as needed for wheezing or shortness of breath.  Marland Kitchen. aspirin EC 81 MG tablet Take 81 mg by mouth every other day.  Marland Kitchen. atorvastatin (LIPITOR) 10 MG tablet Take 10 mg by mouth daily.  Marland Kitchen. buPROPion (WELLBUTRIN XL) 150 MG 24 hr tablet Take 1 tablet (150 mg total) by mouth daily.  . Choline Fenofibrate (TRILIPIX) 135 MG capsule Take 135 mg by mouth daily.    . cilostazol (PLETAL) 100 MG tablet Take 100 mg by mouth 2 (two) times daily.  . clopidogrel (PLAVIX) 75 MG tablet Take 75 mg by mouth daily.    Marland Kitchen. ezetimibe (ZETIA) 10 MG tablet Take 10 mg by mouth daily.  . furosemide (LASIX) 20 MG tablet Take 20 mg by mouth 2 (two) times daily.  . insulin glargine (LANTUS) 100 UNIT/ML injection Inject 110 Units into the skin at bedtime.   . insulin glulisine (APIDRA) 100 UNIT/ML injection Inject 50 Units into the skin 3 (three) times daily.   Marland Kitchen. lubiprostone (AMITIZA) 8 MCG capsule Take 8 mcg by mouth 2 (two) times daily with a meal.  . Olmesartan-Amlodipine-HCTZ (TRIBENZOR) 40-5-25 MG TABS Take 1 tablet by mouth daily.   Marland Kitchen. omeprazole (PRILOSEC) 40 MG capsule Take 40 mg by mouth 2 (two) times daily.  Marland Kitchen. PARoxetine (PAXIL-CR) 37.5 MG 24 hr tablet Take 1 tablet (37.5 mg total) by mouth daily.  . potassium chloride SA (K-DUR,KLOR-CON) 20 MEQ tablet Take 20 mEq by mouth daily.  . ranolazine (RANEXA) 1000  MG SR tablet Take 500 mg by mouth 2 (two) times daily.  . sitaGLIPtin (JANUVIA) 100 MG tablet Take 100 mg by mouth daily.    . Vitamin D, Ergocalciferol, (DRISDOL) 50000 UNITS CAPS capsule Take 50,000 Units by mouth every 7 (seven) days.  . [DISCONTINUED] buPROPion (WELLBUTRIN XL) 150 MG 24 hr tablet Take 1 tablet (150 mg total) by mouth daily.  . [DISCONTINUED] nitroGLYCERIN (NITROSTAT) 0.4 MG SL tablet Place 0.4 mg under the tongue every 5 (five) minutes as needed for chest pain. May repeat for up to 3  doses.  . [DISCONTINUED] PARoxetine (PAXIL-CR) 37.5 MG 24 hr tablet Take 1 tablet (37.5 mg total) by mouth daily.  . [DISCONTINUED] traMADol (ULTRAM) 50 MG tablet Take 1-2 tablets (50-100 mg total) by mouth every 6 (six) hours as needed for moderate pain or severe pain.   No facility-administered encounter medications on file as of 05/24/2015.    Past Psychiatric History/Hospitalization(s): Patient denies any history of suicidal and or any inpatient psychiatric treatment.  She admitted history of mood swings, anger and irritability.  She is seen in this office since 2003.  She was given Risperdal however it was discontinued because patient was feeling better. Anxiety: Yes Bipolar Disorder: Yes Depression: Yes Mania: Yes Psychosis: No Schizophrenia: No Personality Disorder: No Hospitalization for psychiatric illness: No History of Electroconvulsive Shock Therapy: No Prior Suicide Attempts: No  Physical Exam: Constitutional:  BP 123/73 mmHg  Pulse 80  Ht  (1.575 m)  Wt 266 lb 3.2 oz (120.748 kg)  BMI 48.68 kg/m2  General Appearance: well nourished and obese  Musculoskeletal: Strength & Muscle Tone: within normal limits Gait & Station: normal Patient leans: N/A  Psychiatric Specialty Exam: Physical Exam  ROS  Blood pressure 123/73, pulse 80, height  (1.575 m), weight 266 lb 3.2 oz (120.748 kg).Body mass index is 48.68 kg/(m^2).  General Appearance: Casual  Eye Contact::  Fair  Speech:  Normal Rate  Volume:  Normal  Mood:  Euthymic  Affect:  Appropriate  Thought Process:  Coherent  Orientation:  Full (Time, Place, and Person)  Thought Content:  WDL  Suicidal Thoughts:  No  Homicidal Thoughts:  No  Memory:  Immediate;   Fair Recent;   Good Remote;   Good  Judgement:  Good  Insight:  Good  Psychomotor Activity:  Normal  Concentration:  Fair  Recall:  Fair  Fund of Knowledge:  Good  Language:  Good  Akathisia:  No  Handed:  Right  AIMS (if indicated):      Assets:  Communication Skills Desire for Improvement Financial Resources/Insurance Housing Social Support  ADL's:  Intact  Cognition:  WNL  Sleep:       Established Problem, Stable/Improving (1), Review of Last Therapy Session (1) and Review of Medication Regimen & Side Effects (2)  Assessment: 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  I will continue Paxil CR 37.5 mg and Wellbutrin XL 150 mg daily.  Discussed medication side effects and benefits.  Recommended to call us back if she has any question, concern or if she feel worsening of the symptom.  I will see her again in 3 months.  Shavon Ashmore T., MD 05/24/2015

## 2015-07-08 ENCOUNTER — Other Ambulatory Visit: Payer: Self-pay

## 2015-07-08 DIAGNOSIS — Z1231 Encounter for screening mammogram for malignant neoplasm of breast: Secondary | ICD-10-CM

## 2015-07-27 ENCOUNTER — Ambulatory Visit
Admission: RE | Admit: 2015-07-27 | Discharge: 2015-07-27 | Disposition: A | Discharge status: Home / Self Care | Payer: BC Managed Care – PPO | Source: Ambulatory Visit

## 2015-07-27 DIAGNOSIS — Z1231 Encounter for screening mammogram for malignant neoplasm of breast: Secondary | ICD-10-CM

## 2015-08-23 ENCOUNTER — Ambulatory Visit (INDEPENDENT_AMBULATORY_CARE_PROVIDER_SITE_OTHER): Payer: BC Managed Care – PPO | Admitting: Psychiatry

## 2015-08-23 ENCOUNTER — Encounter (HOSPITAL_COMMUNITY): Payer: Self-pay | Admitting: Psychiatry

## 2015-08-23 VITALS — BP 134/78 | HR 80 | Ht 63.0 in | Wt 270.0 lb

## 2015-08-23 DIAGNOSIS — F330 Major depressive disorder, recurrent, mild: Secondary | ICD-10-CM

## 2015-08-23 DIAGNOSIS — F333 Major depressive disorder, recurrent, severe with psychotic symptoms: Secondary | ICD-10-CM | POA: Diagnosis not present

## 2015-08-23 MED ORDER — PAROXETINE HCL ER 37.5 MG PO TB24: 37.5000 mg | ORAL_TABLET | Freq: Every day | ORAL | Status: DC

## 2015-08-23 MED ORDER — BUPROPION HCL ER (XL) 150 MG PO TB24: 150.0000 mg | ORAL_TABLET | Freq: Every day | ORAL | Status: DC

## 2015-08-23 NOTE — Progress Notes (Signed)
[facility] [id number] Progress Note  SHERRIKA WEAKLAND [id number] 55 y.o.  08/23/2015 10:07 AM  Chief Complaint:  I have a lot of back pain and knee pain.  I'm taking pain medication.    History of Present Illness: Maeve came for her followup appointment.  She is complaining of back pain and knee pain.  She is given hydrocodone and she is taking as needed.  She admitted sometime feeling sad because she could not do a lot due to pain.  She is taking Paxil and Wellbutrin.  She endorsed her depression is a stable.  She denies any irritability, anger, mood swing.  Sometimes she has poor sleep because of chronic pain.  She denies any hallucination or any paranoia.  She has gained weight from the last visit and admitted not able to keep her diet and her control.  She has diabetes and she takes multiple medication to manage her diabetes.  She had a good Christmas.  Patient denies any crying spells, feeling of hopelessness or worthlessness.  He denies any suicidal thoughts or homicidal thought.  Her energy level is fair.  Her appetite is okay but she gained weight.  She denies any paranoia or any hallucination.  She denies any tremors shakes or any EPS.  Patient lives with her husband who is very supportive.  She is working and driving school bus.  She likes her job.  Patient denies drinking or using any illegal substances.  Suicidal Ideation: No Plan Formed: No Patient has means to carry out plan: No  Homicidal Ideation: No Plan Formed: No Patient has means to carry out plan: No  Review of Systems: Psychiatric: Agitation: No Hallucination: No Depressed Mood: No Insomnia: No Hypersomnia: No Altered Concentration: No Feels Worthless: No Grandiose Ideas: No Belief In Special Powers: No New/Increased Substance Abuse: No Compulsions: No  Neurologic: Headache: No Seizure: No Paresthesias: No  Past Medical Family, Social History:  Patient has hyperlipidemia, diabetes mellitus, chronic  back pain, hypertension, obesity and coronary artery disease.  Her primary care physician is Dr.Clara Katrinka Blazing at [facility]  Outpatient Encounter Prescriptions as of 08/23/2015  Medication Sig  . acetaminophen (TYLENOL) 500 MG tablet Take 1,000 mg by mouth every 6 (six) hours as needed for mild pain.   Marland Kitchen albuterol (PROVENTIL HFA;VENTOLIN HFA) 108 (90 BASE) MCG/ACT inhaler Inhale 2 puffs into the lungs every 6 (six) hours as needed for wheezing or shortness of breath.  Marland Kitchen aspirin EC 81 MG tablet Take 81 mg by mouth every other day.  Marland Kitchen atorvastatin (LIPITOR) 10 MG tablet Take 10 mg by mouth daily.  Marland Kitchen buPROPion (WELLBUTRIN XL) 150 MG 24 hr tablet Take 1 tablet (150 mg total) by mouth daily.  . Choline Fenofibrate (TRILIPIX) 135 MG capsule Take 135 mg by mouth daily.    . cilostazol (PLETAL) 100 MG tablet Take 100 mg by mouth 2 (two) times daily.  . clopidogrel (PLAVIX) 75 MG tablet Take 75 mg by mouth daily.    Marland Kitchen ezetimibe (ZETIA) 10 MG tablet Take 10 mg by mouth daily.  . furosemide (LASIX) 20 MG tablet Take 20 mg by mouth 2 (two) times daily.  Marland Kitchen HYDROcodone-acetaminophen (NORCO) 10-325 MG tablet TAKE 1 TABLET BY MOUTH TWICE A DAY AS NEEDED FOR SEVERE PAIN  . insulin glargine (LANTUS) 100 UNIT/ML injection Inject 110 Units into the skin at bedtime.   . insulin glulisine (APIDRA) 100 UNIT/ML injection Inject 50 Units into the skin 3 (three) times daily.   Marland Kitchen  lubiprostone (AMITIZA) 8 MCG capsule Take 8 mcg by mouth 2 (two) times daily with a meal.  . Olmesartan-Amlodipine-HCTZ (TRIBENZOR) 40-5-25 MG TABS Take 1 tablet by mouth daily.   Marland Kitchen omeprazole (PRILOSEC) 40 MG capsule Take 40 mg by mouth 2 (two) times daily.  Marland Kitchen PARoxetine (PAXIL-CR) 37.5 MG 24 hr tablet Take 1 tablet (37.5 mg total) by mouth daily.  . potassium chloride SA (K-DUR,KLOR-CON) 20 MEQ tablet Take 20 mEq by mouth daily.  . ranolazine (RANEXA) 1000 MG SR tablet Take 500 mg by mouth 2 (two) times daily.  .  sitaGLIPtin (JANUVIA) 100 MG tablet Take 100 mg by mouth daily.    . Vitamin D, Ergocalciferol, (DRISDOL) 50000 UNITS CAPS capsule Take 50,000 Units by mouth every 7 (seven) days.  . [DISCONTINUED] buPROPion (WELLBUTRIN XL) 150 MG 24 hr tablet Take 1 tablet (150 mg total) by mouth daily.  . [DISCONTINUED] PARoxetine (PAXIL-CR) 37.5 MG 24 hr tablet Take 1 tablet (37.5 mg total) by mouth daily.   No facility-administered encounter medications on file as of 08/23/2015.    Past Psychiatric History/Hospitalization(s): Patient denies any history of suicidal and or any inpatient psychiatric treatment.  She admitted history of mood swings, anger and irritability.  She is seen in this office since 2003.  She was given Risperdal however it was discontinued because patient was feeling better. Anxiety: Yes Bipolar Disorder: Yes Depression: Yes Mania: Yes Psychosis: No Schizophrenia: No Personality Disorder: No Hospitalization for psychiatric illness: No History of Electroconvulsive Shock Therapy: No Prior Suicide Attempts: No  Physical Exam: Constitutional:  BP 134/78 mmHg  Pulse 80  Ht 5\' 3"  (1.6 m)  Wt 270 lb (122.471 kg)  BMI 47.84 kg/m2  General Appearance: well nourished and obese  Musculoskeletal: Strength & Muscle Tone: within normal limits Gait & Station: normal Patient leans: N/A  Psychiatric Specialty Exam: Physical Exam  Review of Systems  Constitutional: Negative for weight loss.  Musculoskeletal: Positive for back pain and joint pain.    Blood pressure 134/78, pulse 80, height 5\' 3"  (1.6 m), weight 270 lb (122.471 kg).Body mass index is 47.84 kg/(m^2).  General Appearance: Casual  Eye Contact::  Fair  Speech:  Normal Rate  Volume:  Normal  Mood:  Euthymic  Affect:  Appropriate  Thought Process:  Coherent  Orientation:  Full (Time, Place, and Person)  Thought Content:  WDL  Suicidal Thoughts:  No  Homicidal Thoughts:  No  Memory:  Immediate;   Fair Recent;    Good Remote;   Good  Judgement:  Good  Insight:  Good  Psychomotor Activity:  Normal  Concentration:  Fair  Recall:  Fair  Fund of Knowledge:  Good  Language:  Good  Akathisia:  No  Handed:  Right  AIMS (if indicated):     Assets:  Communication Skills Desire for Improvement Financial Resources/Insurance Housing Social Support  ADL's:  Intact  Cognition:  WNL  Sleep:       Established Problem, Stable/Improving (1), Review of Last Therapy Session (1) and Review of Medication Regimen & Side Effects (2)  Assessment: 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  However she has gained weight from the past.  Encouraged to watch her calorie intake and to do exercise.  Patient does not want to change her psychiatric medication.  I will continue Paxil CR 37.5 mg and Wellbutrin XL  150 mg daily.  Discussed medication side effects and benefits.  Patient is not interested in counseling.  Recommended to call us back if she has any question, concern or if she feel worsening of the symptom.  I will see her again in 3 months.  Crystalina Stodghill T., MD 08/23/2015

## 2015-10-14 ENCOUNTER — Ambulatory Visit (INDEPENDENT_AMBULATORY_CARE_PROVIDER_SITE_OTHER): Payer: BC Managed Care – PPO | Admitting: Psychiatry

## 2015-10-14 ENCOUNTER — Encounter (HOSPITAL_COMMUNITY): Payer: Self-pay | Admitting: Psychiatry

## 2015-10-14 VITALS — BP 142/78 | HR 93 | Ht 62.5 in | Wt 294.6 lb

## 2015-10-14 DIAGNOSIS — F330 Major depressive disorder, recurrent, mild: Secondary | ICD-10-CM | POA: Diagnosis not present

## 2015-10-14 MED ORDER — BUPROPION HCL ER (XL) 150 MG PO TB24: 150.0000 mg | ORAL_TABLET | Freq: Every day | ORAL | Status: DC

## 2015-10-14 MED ORDER — PAROXETINE HCL ER 37.5 MG PO TB24: 37.5000 mg | ORAL_TABLET | Freq: Every day | ORAL | Status: DC

## 2015-10-14 NOTE — Progress Notes (Signed)
[facility] [id number] Progress Note  Allison [name] [id number] 55 y.o.  10/14/2015 10:39 AM  Chief Complaint:  I am few weeks early because I need an update DMV forms to be completed.  I'm doing fine.      History of Present Illness: Allison [name] came 3 weeks earlier than her scheduled appointment.  She brought DMV forms to be completed.  She required to renew them so she can continue to drive.  Overall she described her mood has been stable.  Recently her husband has abdominal surgery and now he is recovering.  She admitted it was stressful because she is doing multiple things but denies any other issues.  She is taking her medication as prescribed.  She sleeping good.  She denies any irritability, anger, mood swing.  She denies any paranoia or any hallucination.  She had gained more than 20 pounds in past 3 months which she believed due to overeating and not doing exercise.  Recently she's seen her primary care physician Allison [name] and she was hoping that she will prescribed something for weight loss but medicines were not changed.  She had blood work but she is not sure about the results.  She endorsed that she is keeping a good control on her blood sugar.  Patient denies any feeling of hopelessness or worthlessness.  She denies any crying spells or any suicidal thoughts.  She lives with her husband.  Patient denies drinking alcohol or using any illegal substances.  Suicidal Ideation: No Plan Formed: No Patient has means to carry out plan: No  Homicidal Ideation: No Plan Formed: No Patient has means to carry out plan: No  Review of Systems: Psychiatric: Agitation: No Hallucination: No Depressed Mood: No Insomnia: No Hypersomnia: No Altered Concentration: No Feels Worthless: No Grandiose Ideas: No Belief In Special Powers: No New/Increased Substance Abuse: No Compulsions: No  Neurologic: Headache: No Seizure: No Paresthesias: No  Past Medical Family, Social History:   Patient has hyperlipidemia, diabetes mellitus, chronic back pain, hypertension, obesity and coronary artery disease.  Her primary care physician is Dr.Clara Katrinka [name] at [facility].  Outpatient Encounter Prescriptions as of 10/14/2015  Medication Sig  . acetaminophen (TYLENOL) 500 MG tablet Take 1,000 mg by mouth every 6 (six) hours as needed for mild pain.   Marland Kitchen albuterol (PROVENTIL HFA;VENTOLIN HFA) 108 (90 BASE) MCG/ACT inhaler Inhale 2 puffs into the lungs every 6 (six) hours as needed for wheezing or shortness of breath.  Marland Kitchen aspirin EC 81 MG tablet Take 81 mg by mouth every other day.  Marland Kitchen atorvastatin (LIPITOR) 10 MG tablet Take 10 mg by mouth daily.  Marland Kitchen buPROPion (WELLBUTRIN XL) 150 MG 24 hr tablet Take 1 tablet (150 mg total) by mouth daily.  . Choline Fenofibrate (TRILIPIX) 135 MG capsule Take 135 mg by mouth daily.    . cilostazol (PLETAL) 100 MG tablet Take 100 mg by mouth 2 (two) times daily.  . clopidogrel (PLAVIX) 75 MG tablet Take 75 mg by mouth daily.    Marland Kitchen ezetimibe (ZETIA) 10 MG tablet Take 10 mg by mouth daily.  . furosemide (LASIX) 20 MG tablet Take 20 mg by mouth 2 (two) times daily.  Marland Kitchen HYDROcodone-acetaminophen (NORCO) 10-325 MG tablet TAKE 1 TABLET BY MOUTH TWICE A DAY AS NEEDED FOR SEVERE PAIN  . insulin glargine (LANTUS) 100 UNIT/ML injection Inject 110 Units into the skin at bedtime.   . insulin glulisine (APIDRA) 100 UNIT/ML injection Inject 50 Units into the skin  3 (three) times daily.   Marland Kitchen lubiprostone (AMITIZA) 8 MCG capsule Take 8 mcg by mouth 2 (two) times daily with a meal.  . Olmesartan-Amlodipine-HCTZ (TRIBENZOR) 40-5-25 MG TABS Take 1 tablet by mouth daily.   Marland Kitchen omeprazole (PRILOSEC) 40 MG capsule Take 40 mg by mouth 2 (two) times daily.  Marland Kitchen PARoxetine (PAXIL-CR) 37.5 MG 24 hr tablet Take 1 tablet (37.5 mg total) by mouth daily.  . potassium chloride SA (K-DUR,KLOR-CON) 20 MEQ tablet Take 20 mEq by mouth daily.  . ranolazine (RANEXA) 1000 MG SR  tablet Take 500 mg by mouth 2 (two) times daily.  . sitaGLIPtin (JANUVIA) 100 MG tablet Take 100 mg by mouth daily.    . Vitamin D, Ergocalciferol, (DRISDOL) 50000 UNITS CAPS capsule Take 50,000 Units by mouth every 7 (seven) days.  . [DISCONTINUED] buPROPion (WELLBUTRIN XL) 150 MG 24 hr tablet Take 1 tablet (150 mg total) by mouth daily.  . [DISCONTINUED] PARoxetine (PAXIL-CR) 37.5 MG 24 hr tablet Take 1 tablet (37.5 mg total) by mouth daily.   No facility-administered encounter medications on file as of 10/14/2015.    Past Psychiatric History/Hospitalization(s): Patient denies any history of suicidal and or any inpatient psychiatric treatment.  She admitted history of mood swings, anger and irritability.  She is seen in this office since 2003.  She was given Risperdal however it was discontinued because patient was feeling better. Anxiety: Yes Bipolar Disorder: Yes Depression: Yes Mania: Yes Psychosis: No Schizophrenia: No Personality Disorder: No Hospitalization for psychiatric illness: No History of Electroconvulsive Shock Therapy: No Prior Suicide Attempts: No  Physical Exam: Constitutional:  BP 142/78 mmHg  Pulse 93  Ht 5' 2.5" (1.588 m)  Wt 294 lb 9.6 oz (133.63 kg)  BMI 52.99 kg/m2  General Appearance: well nourished and obese  Musculoskeletal: Strength & Muscle Tone: within normal limits Gait & Station: normal Patient leans: N/A  Psychiatric Specialty Exam: Physical Exam  Review of Systems  Constitutional: Negative for weight loss.  Musculoskeletal: Positive for back pain and joint pain.    Blood pressure 142/78, pulse 93, height 5' 2.5" (1.588 m), weight 294 lb 9.6 oz (133.63 kg).Body mass index is 52.99 kg/(m^2).  General Appearance: Casual, Disheveled and Fairly Groomed  Patent attorney::  Fair  Speech:  Normal Rate and Fast  Volume:  Normal  Mood:  Euthymic  Affect:  Appropriate  Thought Process:  Coherent  Orientation:  Full (Time, Place, and Person)   Thought Content:  WDL  Suicidal Thoughts:  No  Homicidal Thoughts:  No  Memory:  Immediate;   Fair Recent;   Good Remote;   Good  Judgement:  Good  Insight:  Good  Psychomotor Activity:  Normal  Concentration:  Fair  Recall:  Fair  Fund of Knowledge:  Good  Language:  Good  Akathisia:  No  Handed:  Right  AIMS (if indicated):     Assets:  Communication Skills Desire for Improvement Financial Resources/Insurance Housing Social Support  ADL's:  Intact  Cognition:  WNL  Sleep:       Established Problem, Stable/Improving (1), Decision to obtain old records (1), Review of Last Therapy Session (1) and Review of Medication Regimen & Side Effects (2)  Assessment: 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  However she continued to gain weight and I strongly encouraged to watch her calorie intake and to  regular exercise.  We will also get her consent to get her blood work from her primary care physician which was done recently.  We will complete her DMV forms so she can renew her driving license.  I will continue Paxil CR 37.5 mg and Wellbutrin XL 150 mg daily.  Discussed medication side effects and benefits.  Patient is not interested in counseling.  Recommended to call us back if she has any question, concern or if she feel worsening of the symptom.  I will see her again in 3 months.  Airyonna Franklyn T., MD 10/14/2015

## 2015-10-17 ENCOUNTER — Encounter (HOSPITAL_COMMUNITY): Payer: Self-pay

## 2015-11-22 ENCOUNTER — Ambulatory Visit (HOSPITAL_COMMUNITY): Payer: Self-pay | Admitting: Psychiatry

## 2016-01-11 ENCOUNTER — Other Ambulatory Visit (HOSPITAL_COMMUNITY): Payer: Self-pay

## 2016-01-11 DIAGNOSIS — F330 Major depressive disorder, recurrent, mild: Secondary | ICD-10-CM

## 2016-01-11 MED ORDER — BUPROPION HCL ER (XL) 150 MG PO TB24: 150.0000 mg | ORAL_TABLET | Freq: Every day | ORAL | Status: DC

## 2016-01-11 MED ORDER — PAROXETINE HCL ER 37.5 MG PO TB24: 37.5000 mg | ORAL_TABLET | Freq: Every day | ORAL | Status: DC

## 2016-01-16 ENCOUNTER — Ambulatory Visit (HOSPITAL_COMMUNITY): Payer: Self-pay | Admitting: Psychiatry

## 2016-03-09 ENCOUNTER — Ambulatory Visit (HOSPITAL_COMMUNITY): Payer: Self-pay | Admitting: Psychiatry

## 2016-03-15 ENCOUNTER — Other Ambulatory Visit (HOSPITAL_COMMUNITY): Payer: Self-pay | Admitting: Psychiatry

## 2016-03-15 DIAGNOSIS — F330 Major depressive disorder, recurrent, mild: Secondary | ICD-10-CM

## 2016-05-15 ENCOUNTER — Encounter (HOSPITAL_COMMUNITY): Payer: Self-pay | Admitting: Psychiatry

## 2016-05-15 ENCOUNTER — Ambulatory Visit (INDEPENDENT_AMBULATORY_CARE_PROVIDER_SITE_OTHER): Payer: BC Managed Care – PPO | Admitting: Psychiatry

## 2016-05-15 DIAGNOSIS — F330 Major depressive disorder, recurrent, mild: Secondary | ICD-10-CM | POA: Diagnosis not present

## 2016-05-15 MED ORDER — BUPROPION HCL ER (XL) 150 MG PO TB24: 150.0000 mg | ORAL_TABLET | Freq: Every day | ORAL | 2 refills | Status: DC

## 2016-05-15 MED ORDER — PAROXETINE HCL ER 37.5 MG PO TB24: 37.5000 mg | ORAL_TABLET | Freq: Every day | ORAL | 2 refills | Status: DC

## 2016-05-15 NOTE — Progress Notes (Signed)
[facility] [id number] Progress Note  Allison SallesRobin M [name] [id number] 55 y.o.  05/15/2016 10:38 AM  Chief Complaint:  I am working too much.  I'm hurting and I continued to gain weight.        History of Present Illness: Allison BallRobin came for his follow-up appointment.  She is taking her medication as prescribed.  She continued to gain weight and recently she's seen her primary care physician will change in her insulin routine and now she is taking NovoLog.  Patient admitted not able to do exercise due to back pain and joint pain but she has cut down her intake.  So far she does not see any improvement .  She like to continue her current psychiatric medication because it is helping her anxiety and depression.  She is not feeling hopeless or helpless.  She denies any mania, psychosis or any hallucination.  In September she was very sad because her 581 year old dog died and one week later her aunt who has lot of health issues also died.  She was sad and depressed but now she is having very well.  Patient denies any irritability, anger, suicidal thoughts or homicidal thought.  She denies any feeling of hopelessness or worthlessness.  She like Wellbutrin and Paxil and denies any side effects including any tremors or shakes.  Her job is very stressful and she admitted being very busy last few weeks.  However she is excited because she will have a good Thanksgiving with her family.  She lives with her husband.  Patient denies drinking alcohol or using any illegal substances.  Suicidal Ideation: No Plan Formed: No Patient has means to carry out plan: No  Homicidal Ideation: No Plan Formed: No Patient has means to carry out plan: No  Review of Systems: Psychiatric: Agitation: No Hallucination: No Depressed Mood: No Insomnia: No Hypersomnia: No Altered Concentration: No Feels Worthless: No Grandiose Ideas: No Belief In Special Powers: No New/Increased Substance Abuse: No Compulsions:  No  Neurologic: Headache: No Seizure: No Paresthesias: No  Past Medical Family, Social History:  Patient has hyperlipidemia, diabetes mellitus, chronic back pain, hypertension, obesity and coronary artery disease.  Her primary care physician is [facility]  Outpatient Encounter Prescriptions as of 05/15/2016  Medication Sig  . acetaminophen (TYLENOL) 500 MG tablet Take 1,000 mg by mouth every 6 (six) hours as needed for mild pain.   Marland Kitchen. albuterol (PROVENTIL HFA;VENTOLIN HFA) 108 (90 BASE) MCG/ACT inhaler Inhale 2 puffs into the lungs every 6 (six) hours as needed for wheezing or shortness of breath.  Marland Kitchen. aspirin EC 81 MG tablet Take 81 mg by mouth every other day.  Marland Kitchen. atorvastatin (LIPITOR) 10 MG tablet Take 10 mg by mouth daily.  Marland Kitchen. buPROPion (WELLBUTRIN XL) 150 MG 24 hr tablet Take 1 tablet (150 mg total) by mouth daily.  . Choline Fenofibrate (TRILIPIX) 135 MG capsule Take 135 mg by mouth daily.    . cilostazol (PLETAL) 100 MG tablet Take 100 mg by mouth 2 (two) times daily.  . clopidogrel (PLAVIX) 75 MG tablet Take 75 mg by mouth daily.    . furosemide (LASIX) 20 MG tablet Take 20 mg by mouth 2 (two) times daily.  Marland Kitchen. HYDROcodone-acetaminophen (NORCO) 10-325 MG tablet TAKE 1 TABLET BY MOUTH TWICE A DAY AS NEEDED FOR SEVERE PAIN  . insulin glargine (LANTUS) 100 UNIT/ML injection Inject 110 Units into the skin at bedtime.   Marland Kitchen. lubiprostone (AMITIZA) 8 MCG capsule  Take 8 mcg by mouth 2 (two) times daily with a meal.  . nitroGLYCERIN (NITROSTAT) 0.4 MG SL tablet PLACE 1 TONGUE UNDER TONGUE EVERY 5 MINUTES IF NEEDED FOR CHEST PAIN  . NOVOLOG 100 UNIT/ML injection   . Olmesartan-Amlodipine-HCTZ (TRIBENZOR) 40-5-25 MG TABS Take 1 tablet by mouth daily.   Marland Kitchen omeprazole (PRILOSEC) 40 MG capsule Take 40 mg by mouth 2 (two) times daily.  Marland Kitchen PARoxetine (PAXIL-CR) 37.5 MG 24 hr tablet Take 1 tablet (37.5 mg total) by mouth daily.  . potassium chloride SA  (K-DUR,KLOR-CON) 20 MEQ tablet Take 20 mEq by mouth daily.  . ranolazine (RANEXA) 1000 MG SR tablet Take 500 mg by mouth 2 (two) times daily.  . sitaGLIPtin (JANUVIA) 100 MG tablet Take 100 mg by mouth daily.    . Vitamin D, Ergocalciferol, (DRISDOL) 50000 UNITS CAPS capsule Take 50,000 Units by mouth every 7 (seven) days.  . [DISCONTINUED] buPROPion (WELLBUTRIN XL) 150 MG 24 hr tablet take 1 tablet by mouth once daily  . [DISCONTINUED] ezetimibe (ZETIA) 10 MG tablet Take 10 mg by mouth daily.  . [DISCONTINUED] insulin glulisine (APIDRA) 100 UNIT/ML injection Inject 50 Units into the skin 3 (three) times daily.   . [DISCONTINUED] PARoxetine (PAXIL-CR) 37.5 MG 24 hr tablet take 1 tablet by mouth once daily   No facility-administered encounter medications on file as of 05/15/2016.     Past Psychiatric History/Hospitalization(s): Patient denies any history of suicidal and or any inpatient psychiatric treatment.  She admitted history of mood swings, anger and irritability.  She is seen in this office since 2003.  She was given Risperdal however it was discontinued because patient was feeling better. Anxiety: Yes Bipolar Disorder: Yes Depression: Yes Mania: Yes Psychosis: Yes Schizophrenia: No Personality Disorder: No Hospitalization for psychiatric illness: No History of Electroconvulsive Shock Therapy: No Prior Suicide Attempts: No  Physical Exam: Constitutional:  BP 136/80   Pulse 92   Ht 5\' 3"  (1.6 m)   Wt 300 lb (136.1 kg) Comment: Patient refused weight, she gave me the number verbally  BMI 53.14 kg/m   General Appearance: well nourished and obese  Musculoskeletal: Strength & Muscle Tone: within normal limits Gait & Station: normal Patient leans: N/A  Psychiatric Specialty Exam: Physical Exam  Review of Systems  Constitutional: Positive for malaise/fatigue. Negative for weight loss.  HENT: Negative.   Eyes: Negative.   Cardiovascular: Negative.   Musculoskeletal:  Positive for back pain and joint pain.  Skin: Negative.   Neurological: Negative.     Blood pressure 136/80, pulse 92, height 5\' 3"  (1.6 m), weight 300 lb (136.1 kg).Body mass index is 53.14 kg/m.  General Appearance: Fairly Groomed  Patent attorney::  Good  Speech:  Clear and Coherent and Normal Rate  Volume:  Normal  Mood:  Euthymic  Affect:  Appropriate  Thought Process:  Goal Directed  Orientation:  Full (Time, Place, and Person)  Thought Content:  Logical and Rumination  Suicidal Thoughts:  No  Homicidal Thoughts:  No  Memory:  Immediate;   Fair Recent;   Good Remote;   Fair  Judgement:  Good  Insight:  Good  Psychomotor Activity:  Normal  Concentration:  Fair  Recall:  Fair  Fund of Knowledge:  Good  Language:  Good  Akathisia:  No  Handed:  Right  AIMS (if indicated):     Assets:  Communication Skills Desire for Improvement Financial Resources/Insurance Housing Social Support  ADL's:  Intact  Cognition:  WNL  Sleep:  Established Problem, Stable/Improving (1), Review of Last Therapy Session (1) and Review of Medication Regimen & Side Effects (2)  Assessment: 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  She admitted not doing exercise because of joint pain.  She like to continue her current psychiatric medication.  She recently seen her physician however did not mention about weight loss.  I encourage her to seek nutritional or dietitian consult to help weight loss.  I will continue Paxil CR 37.5 mg daily and Wellbutrin XL 150 mg daily.  Patient has no side effects.  Recommended to call us back if she has any question, concern if she feels worsening of the symptom.  Follow-up in 3 months.  Addysin Porco T., MD 05/15/2016

## 2016-06-18 IMAGING — CR DG CHEST 1V PORT
1 series · 1 of 1 positions shown · non-contrast
Comparison: 12/12/2014 .

CLINICAL DATA: Acute respiratory failure.

EXAM:
PORTABLE CHEST - 1 VIEW

[ap portable]
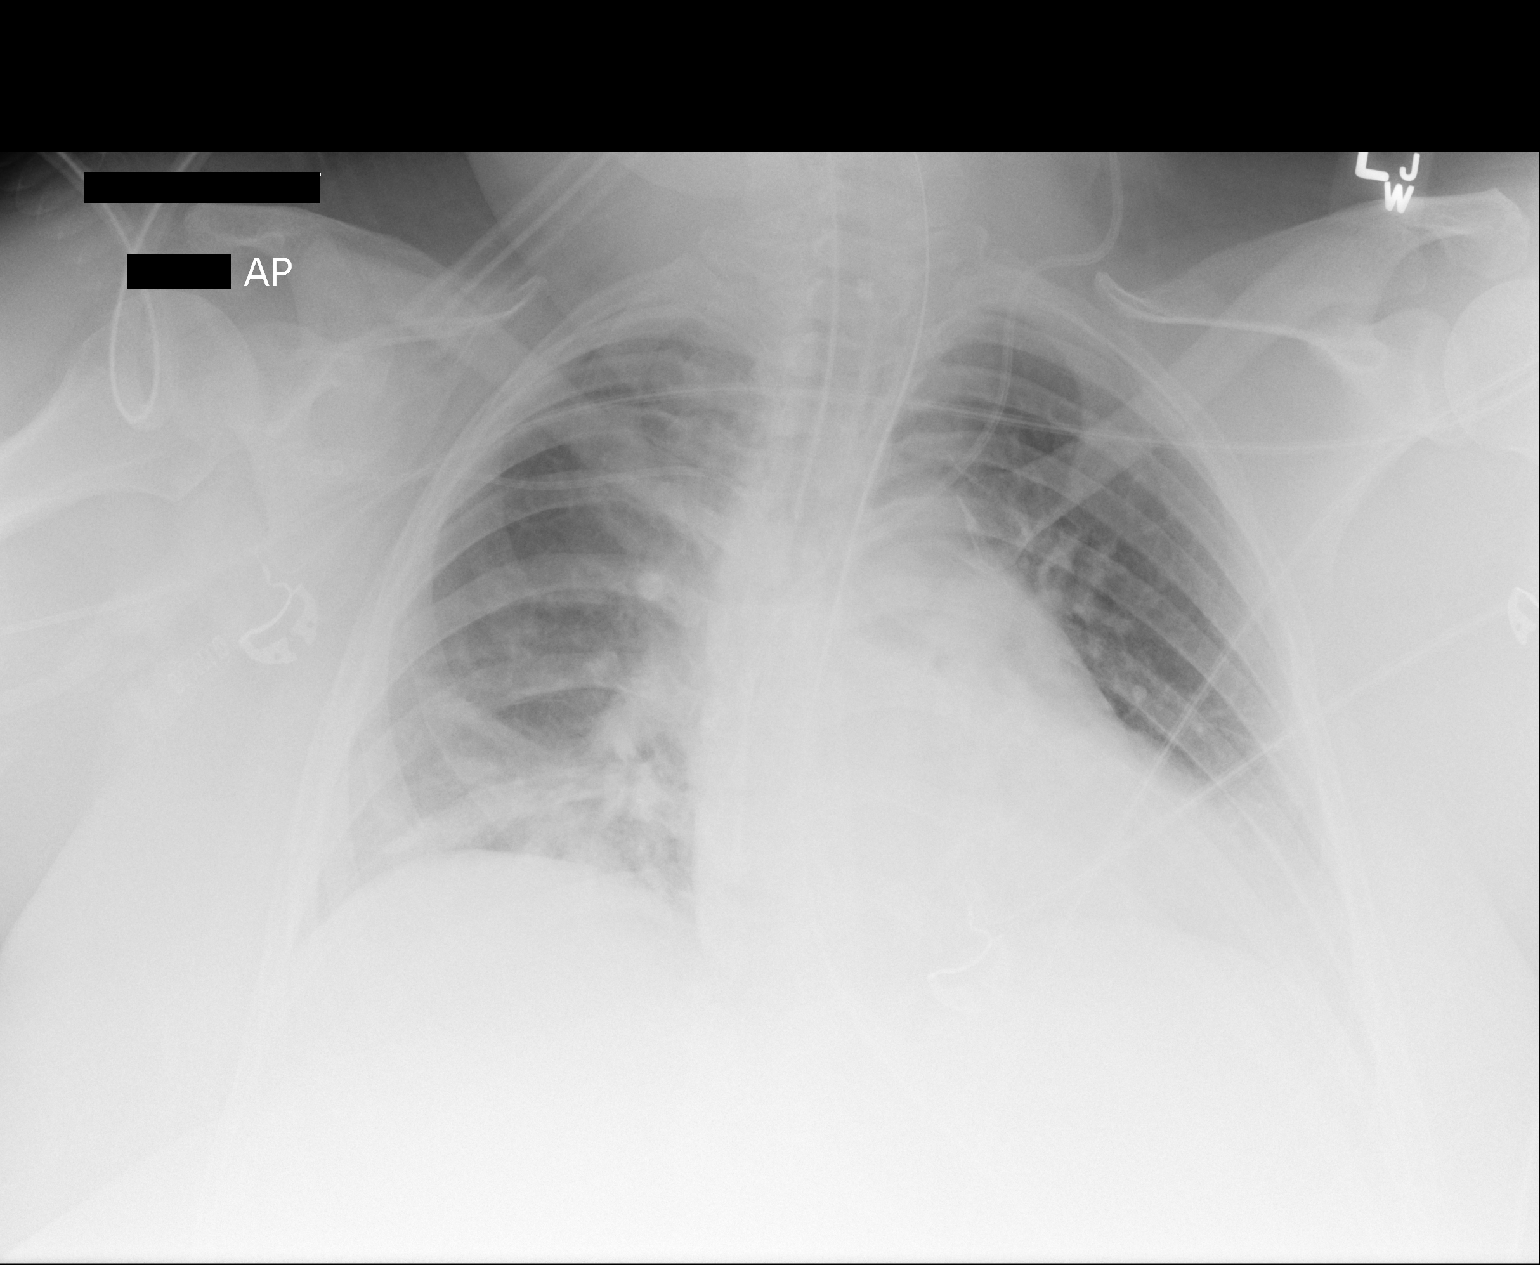

[1 of 1 positions shown; findings below may reference images not displayed]

FINDINGS: Endotracheal tube, NG tube, right PICC line, left IJ line in stable
position. Cardiomegaly. Bibasilar atelectasis and/or infiltrates are
present. No pleural effusion or pneumothorax.
IMPRESSION: 1. Lines and tubes in stable position.
2. Bibasilar atelectasis and/or infiltrates. These changes are new
from prior exam.
3. Stable cardiomegaly.  No pulmonary venous congestion.

## 2016-06-19 IMAGING — CR DG CHEST 1V PORT
1 series · 1 of 1 positions shown · non-contrast
Comparison: [DATE] and earlier.

CLINICAL DATA: 54-year-old female with ventilator dependent
respiratory failure. Initial encounter.

EXAM:
PORTABLE CHEST - 1 VIEW

[AP]
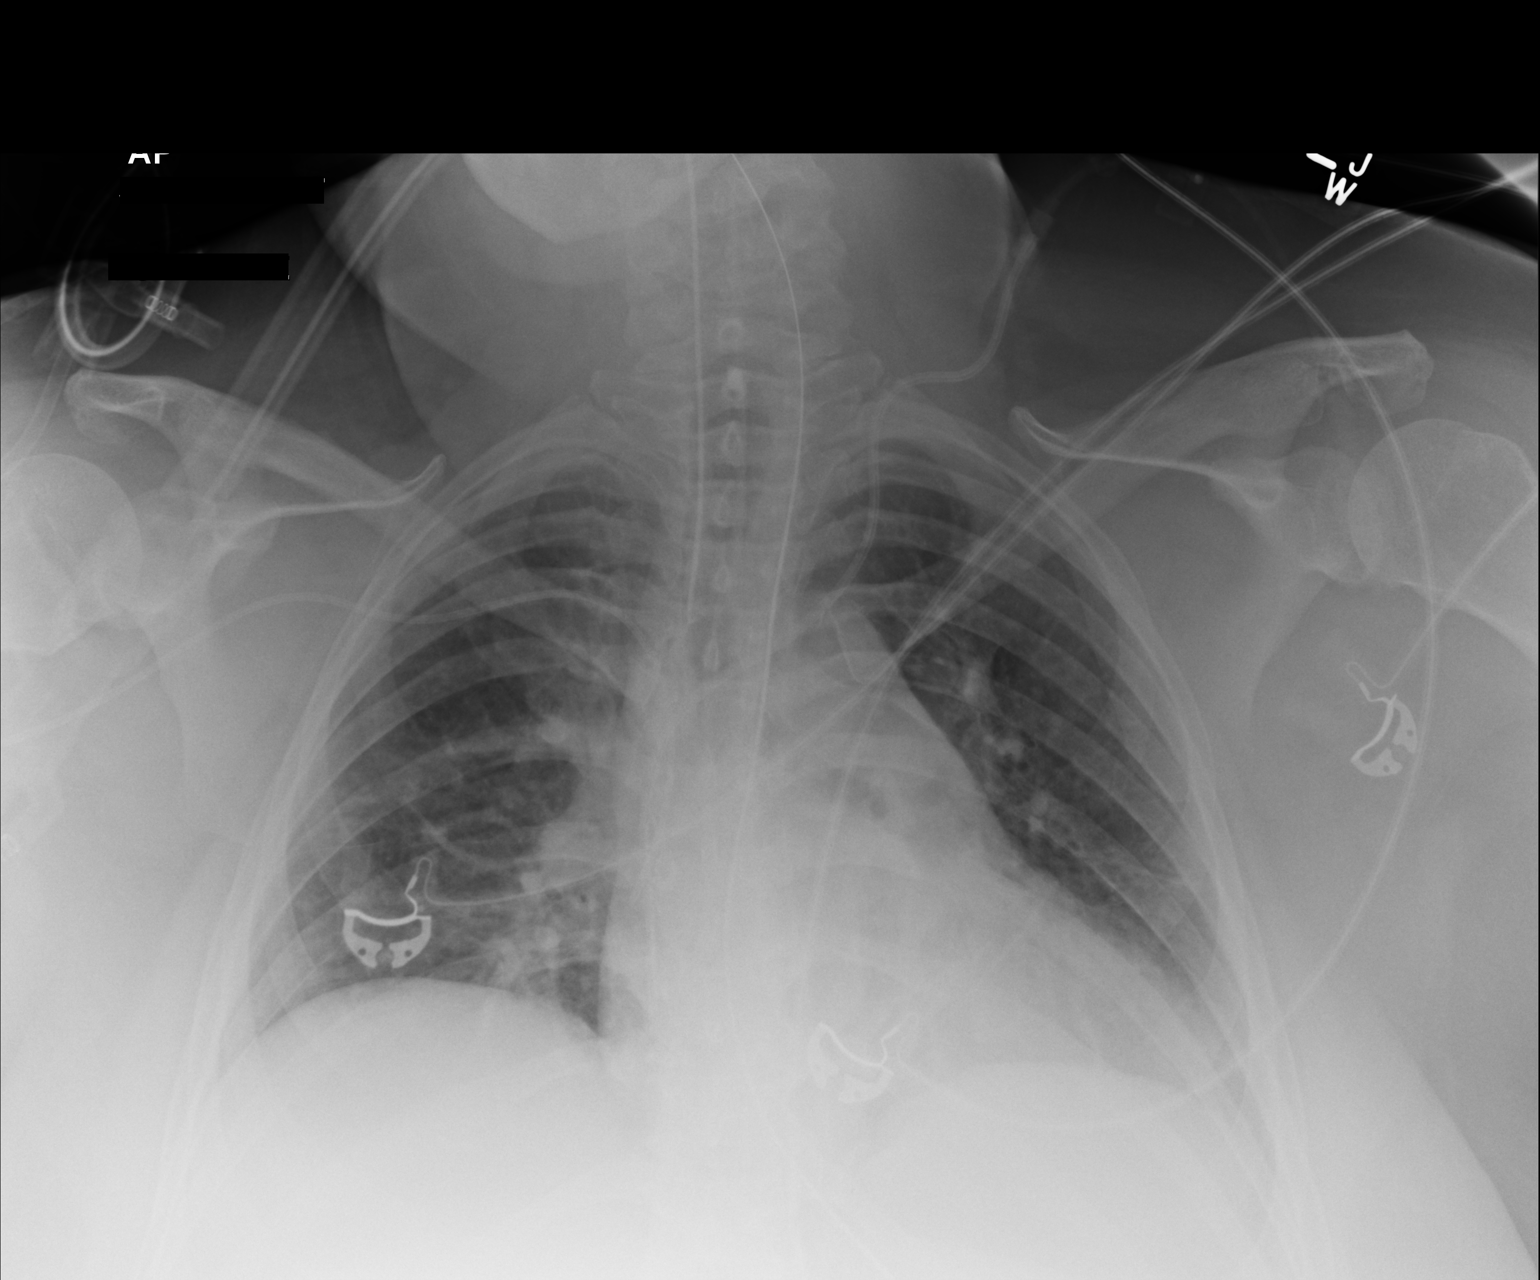

[1 of 1 positions shown; findings below may reference images not displayed]

FINDINGS: Portable AP semi upright view at 0818 hrs. Stable endotracheal tube,
visible enteric tube, left IJ central line, and right side PICC
line.

Mildly improved lung volumes and improved bibasilar ventilation with
decreased patchy pulmonary opacity. No pneumothorax, pulmonary edema
or pleural effusion. Stable cardiac size and mediastinal contours.
IMPRESSION: 1.  Stable lines and tubes.
2. Improved bibasilar ventilation with regressed bilateral pulmonary
opacity. No new cardiopulmonary abnormality.

## 2016-06-20 IMAGING — CR DG CHEST 1V PORT
1 series · 1 of 1 positions shown · non-contrast
Comparison: 12/14/2014.

CLINICAL DATA: Subsequent encounter for acute respiratory failure.

EXAM:
PORTABLE CHEST - 1 VIEW

[AP]
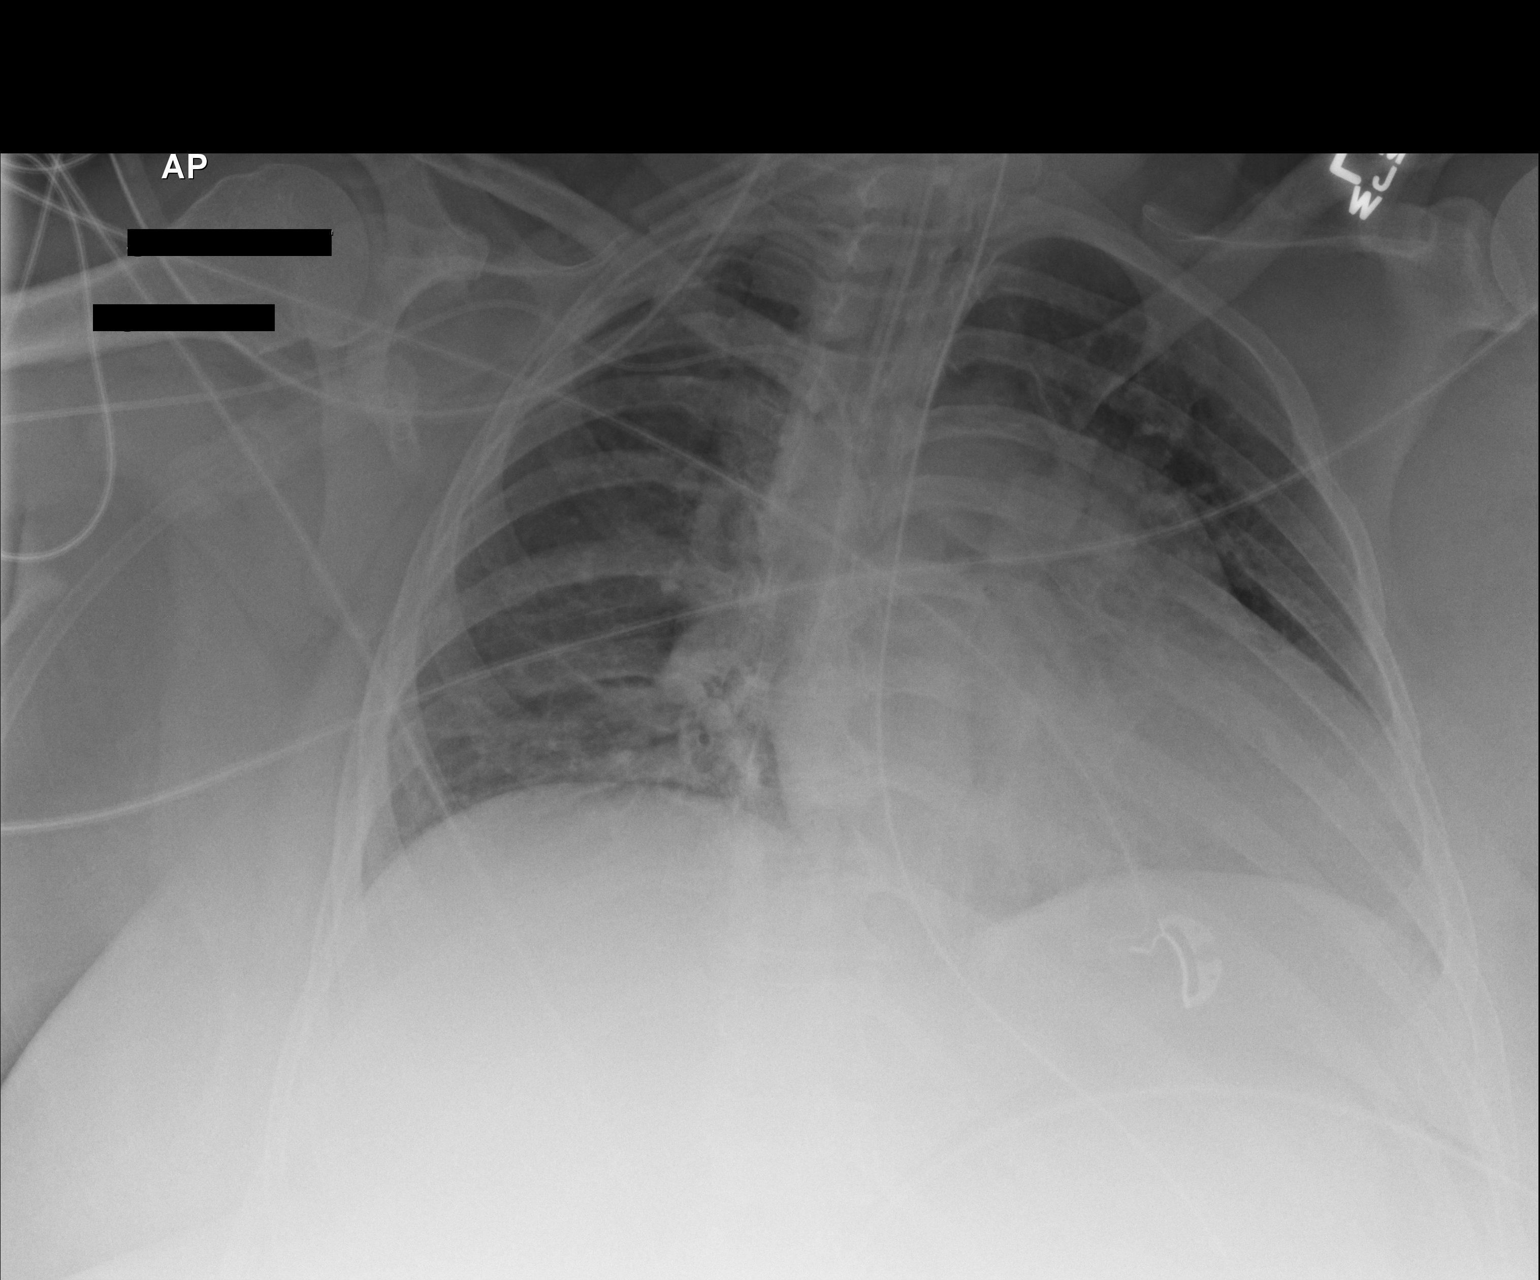

[1 of 1 positions shown; findings below may reference images not displayed]

FINDINGS: 2585 hrs. Low volume film with leftward patient rotation. The cardio
pericardial silhouette is enlarged. Endotracheal tube tip is
approximately 3.1 cm above the base of the carina. Right PICC line
tip overlies expected location of the SVC/RA junction. The NG tube
passes into the stomach although the distal tip position is not
included on the film. Bibasilar atelectasis noted. No overt
pulmonary edema.
IMPRESSION: Mild basilar atelectasis on this rotated film. No substantial
interval change.

## 2016-06-21 IMAGING — CR DG CHEST 1V PORT
1 series · 1 of 1 positions shown · non-contrast
Comparison: 12/15/2014

CLINICAL DATA: Acute respiratory failure

EXAM:
PORTABLE CHEST - 1 VIEW

[AP]
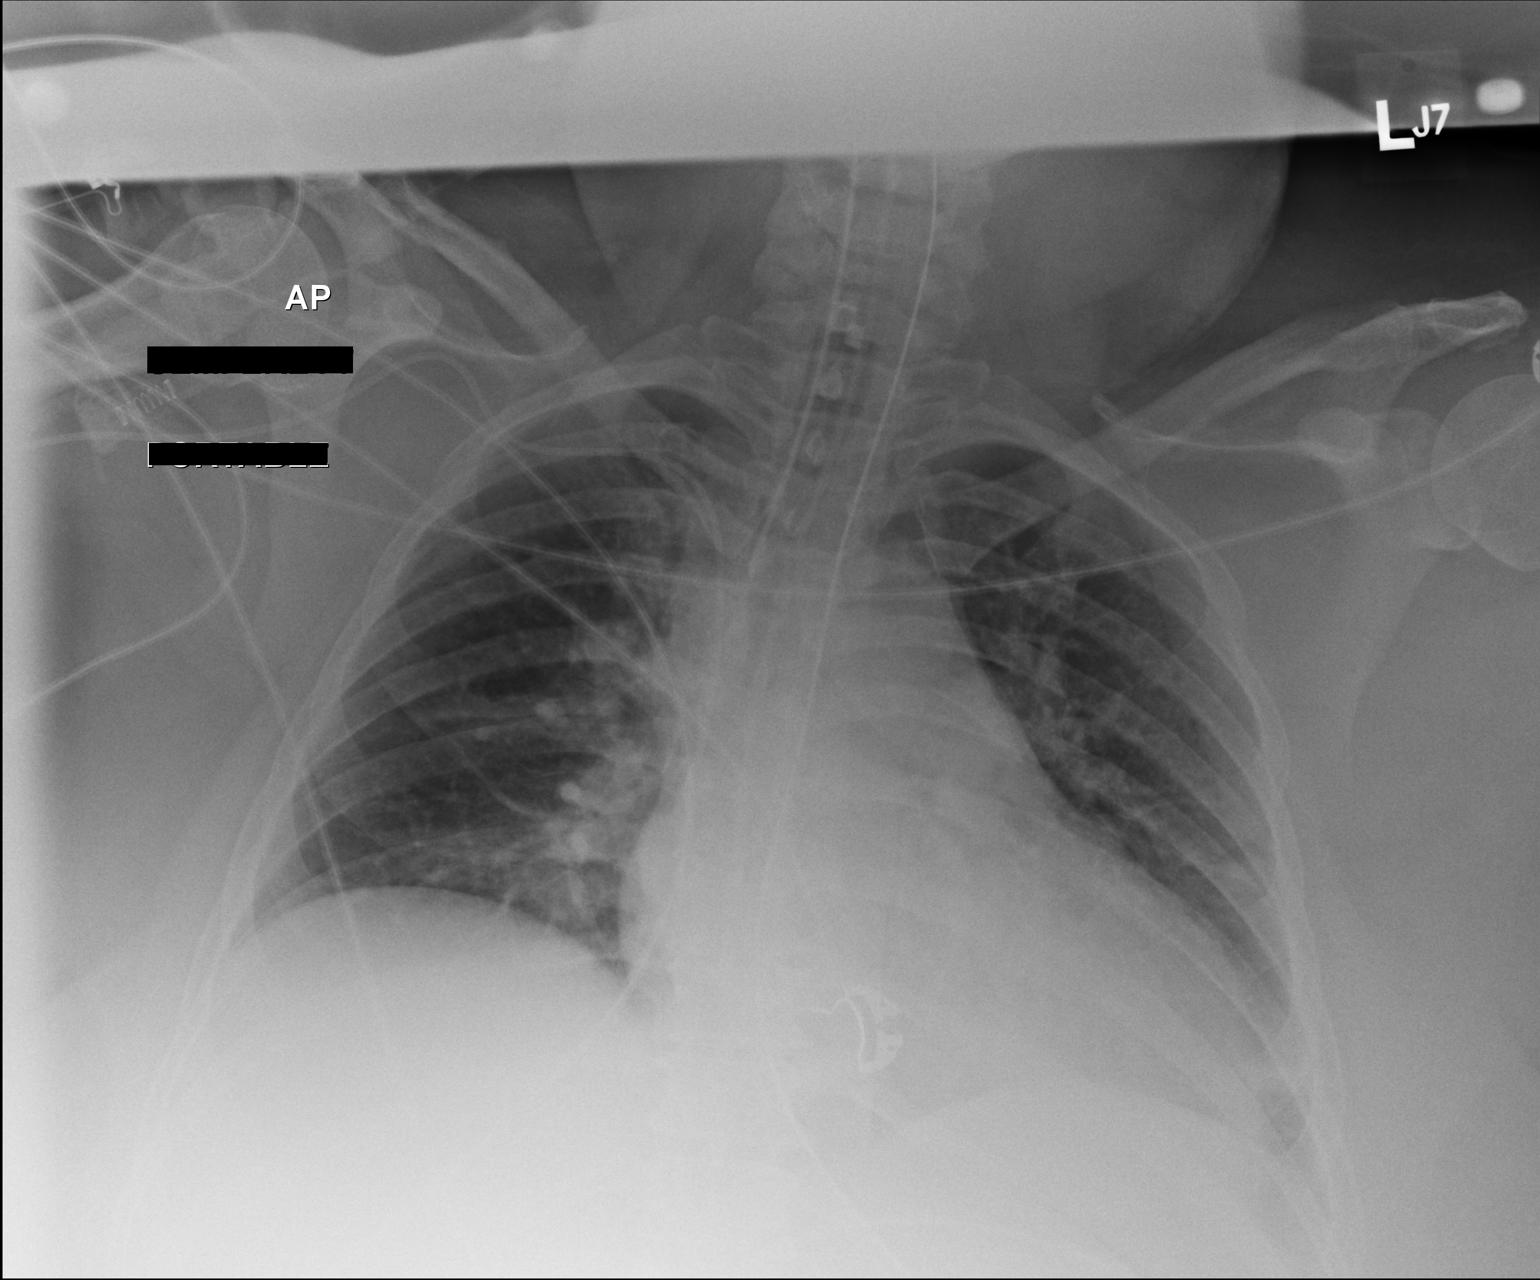

[1 of 1 positions shown; findings below may reference images not displayed]

FINDINGS: Endotracheal tube in good position. NG tube in the stomach. Right
arm PICC tip difficult to visualize due to overlying EKG wires.
Catheter tip appears to be at the cavoatrial junction and unchanged

Cardiac enlargement with vascular congestion. Improvement in right
lower lobe atelectasis. No edema or effusion.
IMPRESSION: Slightly improved aeration in the lung bases with decrease in right
lower lobe atelectasis. Support lines remain in good position.

## 2016-06-22 IMAGING — CR DG CHEST 1V PORT
1 series · 1 of 1 positions shown · non-contrast
Comparison: 12/16/2014 .

CLINICAL DATA: Respiratory failure.

EXAM:
PORTABLE CHEST - 1 VIEW

[ap]
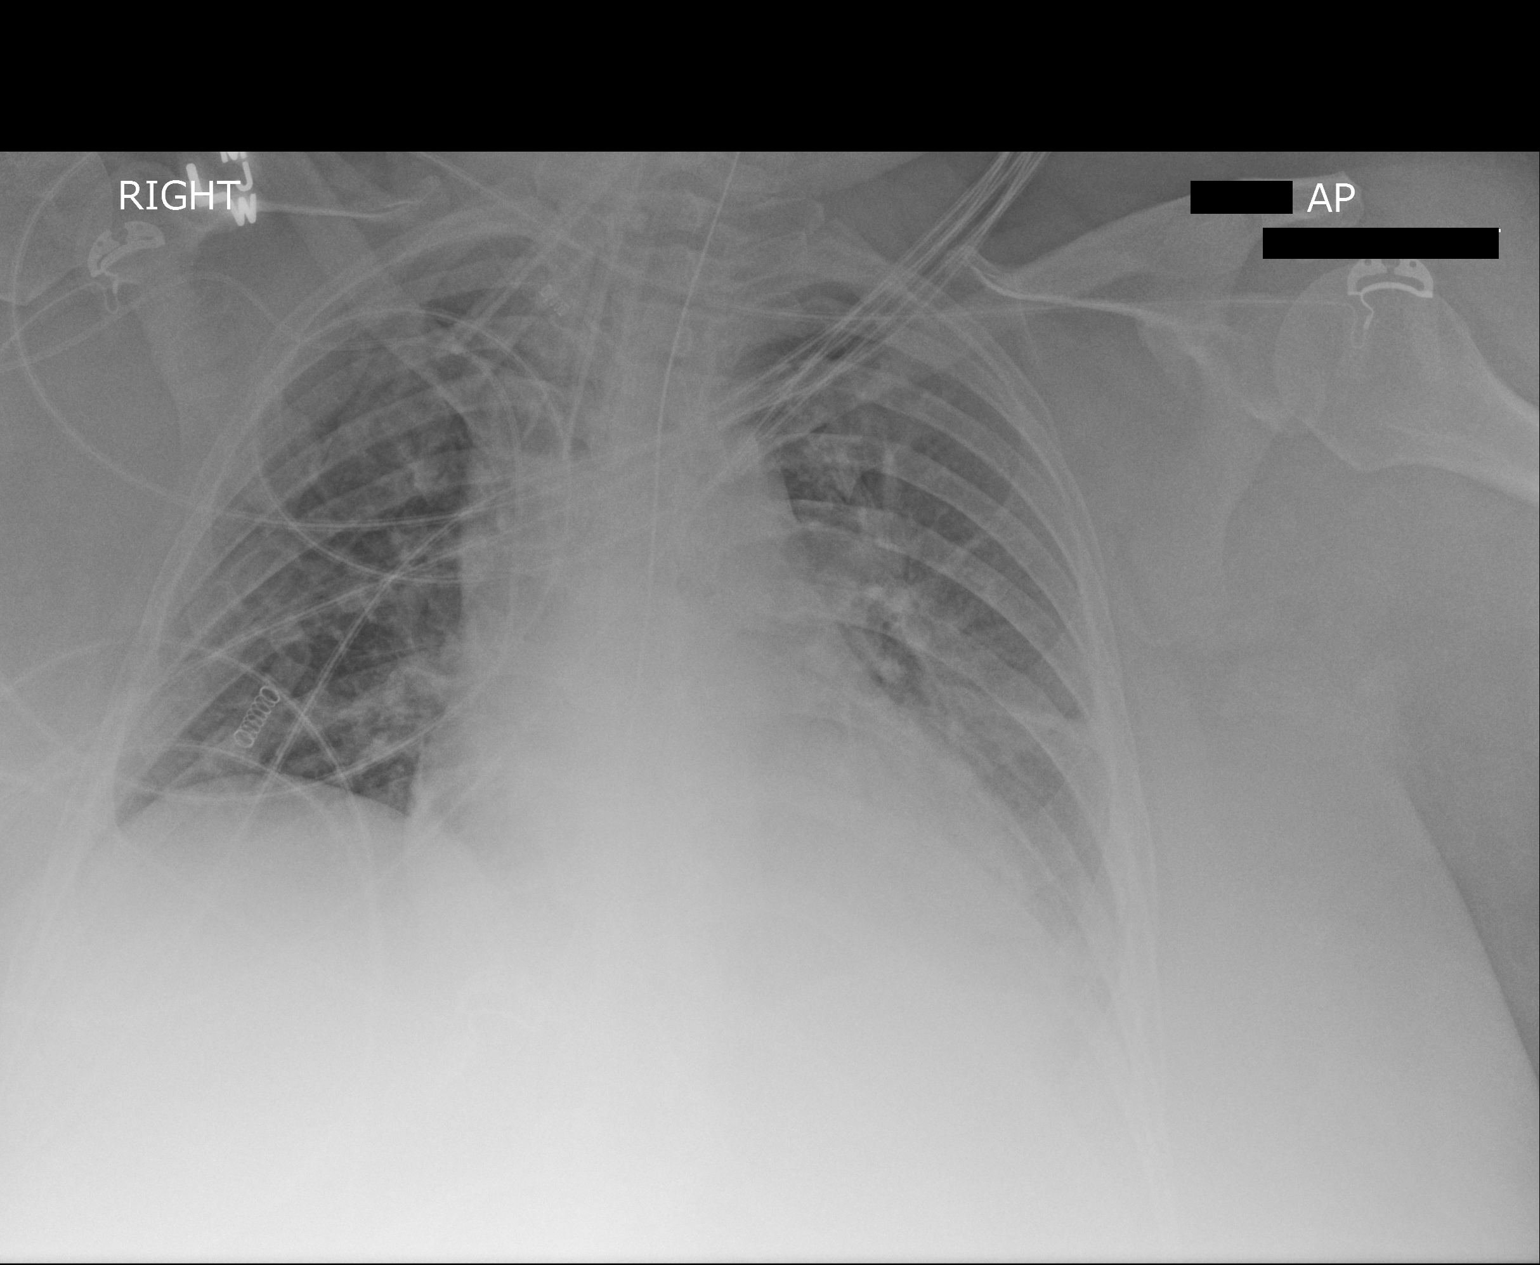

[1 of 1 positions shown; findings below may reference images not displayed]

FINDINGS: Endotracheal tube and NG tube in stable position. Stable
cardiomegaly with mild pulmonary vascular congestion. Mild
interstitial prominence. Mild component congestive heart failure
most likely present. Small left pleural effusion scratched. Low lung
volumes with basilar subsegmental atelectasis. No pneumothorax .
IMPRESSION: 1. Lines and tubes in stable position .
2. Cardiomegaly with mild pulmonary venous congestion and
interstitial prominence. Small left pleural effusion. Findings
consistent with mild congestive heart failure. Interstitial edema
slightly more prominent on today's exam.
3. Low lung volumes with mild bibasilar subsegmental atelectasis.

## 2016-08-15 ENCOUNTER — Ambulatory Visit (INDEPENDENT_AMBULATORY_CARE_PROVIDER_SITE_OTHER): Payer: BC Managed Care – PPO | Admitting: Psychiatry

## 2016-08-15 ENCOUNTER — Encounter (HOSPITAL_COMMUNITY): Payer: Self-pay | Admitting: Psychiatry

## 2016-08-15 VITALS — BP 132/80 | HR 92 | Ht 62.5 in | Wt 312.0 lb

## 2016-08-15 DIAGNOSIS — F330 Major depressive disorder, recurrent, mild: Secondary | ICD-10-CM

## 2016-08-15 DIAGNOSIS — Z79899 Other long term (current) drug therapy: Secondary | ICD-10-CM | POA: Diagnosis not present

## 2016-08-15 DIAGNOSIS — Z818 Family history of other mental and behavioral disorders: Secondary | ICD-10-CM | POA: Diagnosis not present

## 2016-08-15 DIAGNOSIS — Z7982 Long term (current) use of aspirin: Secondary | ICD-10-CM

## 2016-08-15 DIAGNOSIS — Z888 Allergy status to other drugs, medicaments and biological substances status: Secondary | ICD-10-CM | POA: Diagnosis not present

## 2016-08-15 DIAGNOSIS — Z882 Allergy status to sulfonamides status: Secondary | ICD-10-CM

## 2016-08-15 DIAGNOSIS — Z87891 Personal history of nicotine dependence: Secondary | ICD-10-CM

## 2016-08-15 DIAGNOSIS — Z9889 Other specified postprocedural states: Secondary | ICD-10-CM | POA: Diagnosis not present

## 2016-08-15 DIAGNOSIS — Z91040 Latex allergy status: Secondary | ICD-10-CM

## 2016-08-15 DIAGNOSIS — Z880 Allergy status to penicillin: Secondary | ICD-10-CM

## 2016-08-15 MED ORDER — PAROXETINE HCL ER 37.5 MG PO TB24: 37.5000 mg | ORAL_TABLET | Freq: Every day | ORAL | 0 refills | Status: DC

## 2016-08-15 MED ORDER — LAMOTRIGINE 25 MG PO TABS: ORAL_TABLET | ORAL | 0 refills | Status: DC

## 2016-08-15 MED ORDER — BUPROPION HCL ER (XL) 150 MG PO TB24: 150.0000 mg | ORAL_TABLET | Freq: Every day | ORAL | 0 refills | Status: DC
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
## 2016-09-12 ENCOUNTER — Ambulatory Visit (HOSPITAL_COMMUNITY): Payer: Self-pay | Admitting: Psychiatry

## 2016-09-14 ENCOUNTER — Encounter (HOSPITAL_COMMUNITY): Payer: Self-pay | Admitting: Psychiatry

## 2016-09-14 ENCOUNTER — Ambulatory Visit (INDEPENDENT_AMBULATORY_CARE_PROVIDER_SITE_OTHER): Payer: BC Managed Care – PPO | Admitting: Psychiatry

## 2016-09-14 DIAGNOSIS — Z888 Allergy status to other drugs, medicaments and biological substances status: Secondary | ICD-10-CM

## 2016-09-14 DIAGNOSIS — F330 Major depressive disorder, recurrent, mild: Secondary | ICD-10-CM

## 2016-09-14 DIAGNOSIS — Z79899 Other long term (current) drug therapy: Secondary | ICD-10-CM

## 2016-09-14 DIAGNOSIS — Z794 Long term (current) use of insulin: Secondary | ICD-10-CM | POA: Diagnosis not present

## 2016-09-14 DIAGNOSIS — Z87891 Personal history of nicotine dependence: Secondary | ICD-10-CM | POA: Diagnosis not present

## 2016-09-14 DIAGNOSIS — Z880 Allergy status to penicillin: Secondary | ICD-10-CM | POA: Diagnosis not present

## 2016-09-14 DIAGNOSIS — Z882 Allergy status to sulfonamides status: Secondary | ICD-10-CM | POA: Diagnosis not present

## 2016-09-14 DIAGNOSIS — Z818 Family history of other mental and behavioral disorders: Secondary | ICD-10-CM | POA: Diagnosis not present

## 2016-09-14 DIAGNOSIS — Z7982 Long term (current) use of aspirin: Secondary | ICD-10-CM | POA: Diagnosis not present

## 2016-09-14 DIAGNOSIS — Z91040 Latex allergy status: Secondary | ICD-10-CM | POA: Diagnosis not present

## 2016-09-14 MED ORDER — PAROXETINE HCL ER 37.5 MG PO TB24: 37.5000 mg | ORAL_TABLET | Freq: Every day | ORAL | 1 refills | Status: DC

## 2016-09-14 MED ORDER — BUPROPION HCL ER (XL) 150 MG PO TB24: 150.0000 mg | ORAL_TABLET | Freq: Every day | ORAL | 1 refills | Status: DC

## 2016-09-14 MED ORDER — LAMOTRIGINE 25 MG PO TABS: ORAL_TABLET | ORAL | 1 refills | Status: DC

## 2016-09-14 NOTE — Progress Notes (Signed)
BH MD/PA/NP OP Progress Note  09/14/2016 11:08 AM Allison [name]  MRN:  [id number]  Chief Complaint:  Subjective:  I like new medication.  I'm only taking 1 tablet.  HPI: Allison [name] came for her follow-up appointment.  On her last visit we started her on Lamictal .  She is feeling better.  She is less irritable and less anxious.  However she is only taking 25 mg cause she was not given enough tablets.  She is sleeping better.  She is relieved that child complaint was resolved .  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  She's noticed that her sleep is much improved.  She denies any crying spells, irritability, mania or any psychosis.  She is more hopeful and she has increased energy.  She apologized not able to see a nutritional consult for weight loss.  But she is pleased that her weight did not increase with new medication.  She is taking Paxil and Wellbutrin.  She has no tremors, shakes, rash, EPS or any side effects.  Her appetite is okay.  Her vital signs are stable.  Visit Diagnosis:    ICD-9-CM ICD-10-CM   1. 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  She had history of mood swing, anger, irritability.  She is seeing psychiatrist in this office since 2003.  In the past she has given Risperdal however she discontinued on her own because she was feeling better.      Past Medical History:  Past Medical History:  Diagnosis Date  . Bipolar disorder (HCC)   . Dysphasia   . Hemorrhoids   . History of MRSA infection   . Hyperlipidemia   . Hypokalemia   . Hypotension   . Major depressive disorder   . Migraine headache   . Non-insulin dependent diabetes mellitus   . Non-ST elevated myocardial infarction  (non-STEMI) [facility])     Past Surgical History:  Procedure Laterality Date  . CARDIAC CATHETERIZATION  01/03/2010  . ENDOMETRIAL ABLATION  2009   Laser  . ESOPHAGEAL DILATION  2006  . KNEE ARTHROSCOPY  1996   Right  . LAPAROTOMY N/A 12/10/2014   Procedure: EXPLORATORY LAPAROTOMY, APPENDECTOMY, SMALL BOWEL RESECTION, WOUND VAC PLACEMENT;  Surgeon: Emelia Loron, MD;  Location: [facility];  Service: General;  Laterality: N/A;  . LAPAROTOMY N/A 12/12/2014   Procedure: EXPLORATORY LAPAROTOMY, RENASTAMOSISED SMALL BOWEL;  Surgeon: Axel Filler, MD;  Location: [facility] OR;  Service: General;  Laterality: N/A;  . VACUUM ASSISTED CLOSURE CHANGE N/A 12/14/2014   Procedure: ABDOMINAL VACUUM CHANGE;  Surgeon: Chevis Pretty III, MD;  Location: [facility] OR;  Service: General;  Laterality: N/A;  . VACUUM ASSISTED CLOSURE CHANGE N/A 12/16/2014   Procedure: VACUUM CHANGE  AND WOUND CLOSURE ;  Surgeon: Chevis Pretty III, MD;  Location: [facility];  Service: General;  Laterality: N/A;    Family Psychiatric History: Reviewed.   Family History:  Family History  Problem Relation Age of Onset  . Bipolar disorder Sister     Social History:  Social History   Social History  . Marital status: Married    Spouse name: N/A  . Number of children: N/A  . Years of education: N/A   Social  History Main Topics  . Smoking status: Former Games developer  . Smokeless tobacco: Never Used  . Alcohol use No  . Drug use: No  . Sexual activity: Not Asked   Other Topics Concern  . None   Social History Narrative  . None    Allergies:  Allergies  Allergen Reactions  . Sulfonamide Derivatives Anaphylaxis  . Aspirin Nausea And Vomiting  . Penicillins Hives  . Latex Rash    Metabolic Disorder Labs: Lab Results  Component Value Date   HGBA1C 6.0 (H) 12/07/2014   MPG 126 12/07/2014   MPG 289 (H) 01/02/2010   No results found for: PROLACTIN Lab Results  Component Value Date   CHOL (H) 01/02/2010    257        ATP III  CLASSIFICATION:  <200     mg/dL   Desirable  454-098  mg/dL   Borderline High  >=119    mg/dL   High          TRIG 147 (H) 12/20/2014   HDL 36 (L) 01/02/2010   CHOLHDL 7.1 01/02/2010   VLDL 68 (H) 01/02/2010   LDLCALC (H) 01/02/2010    153        Total Cholesterol/HDL:CHD Risk Coronary Heart Disease Risk Table                     Men   Women  1/2 Average Risk   3.4   3.3  Average Risk       5.0   4.4  2 X Average Risk   9.6   7.1  3 X Average Risk  23.4   11.0        Use the calculated Patient Ratio above and the CHD Risk Table to determine the patient's CHD Risk.        ATP III CLASSIFICATION (LDL):  <100     mg/dL   Optimal  829-562  mg/dL   Near or Above                    Optimal  130-159  mg/dL   Borderline  130-865  mg/dL   High  >784     mg/dL   Very High     Current Medications: Current Outpatient Prescriptions  Medication Sig Dispense Refill  . acetaminophen (TYLENOL) 500 MG tablet Take 1,000 mg by mouth every 6 (six) hours as needed for mild pain.     Marland Kitchen albuterol (PROVENTIL HFA;VENTOLIN HFA) 108 (90 BASE) MCG/ACT inhaler Inhale 2 puffs into the lungs every 6 (six) hours as needed for wheezing or shortness of breath.    Marland Kitchen aspirin EC 81 MG tablet Take 81 mg by mouth every other day.    Marland Kitchen atorvastatin (LIPITOR) 10 MG tablet Take 10 mg by mouth daily.    Marland Kitchen buPROPion (WELLBUTRIN XL) 150 MG 24 hr tablet Take 1 tablet (150 mg total) by mouth daily. 30 tablet 1  . Choline Fenofibrate (TRILIPIX) 135 MG capsule Take 135 mg by mouth daily.      . cilostazol (PLETAL) 100 MG tablet Take 100 mg by mouth 2 (two) times daily.    . clopidogrel (PLAVIX) 75 MG tablet Take 75 mg by mouth daily.      . furosemide (LASIX) 20 MG tablet Take 20 mg by mouth 2 (two) times daily.    Marland Kitchen HYDROcodone-acetaminophen (NORCO) 10-325 MG tablet TAKE 1 TABLET BY MOUTH TWICE A DAY AS NEEDED FOR SEVERE PAIN  0  . insulin glargine (LANTUS) 100 UNIT/ML injection Inject 110 Units into the skin at  bedtime.     . lamoTRIgine (LAMICTAL) 25 MG tablet Take 2 tab daily 60 tablet 1  . lubiprostone (AMITIZA) 8 MCG capsule Take 8 mcg by mouth 2 (two) times daily with a meal.    . nitroGLYCERIN (NITROSTAT) 0.4 MG SL tablet PLACE 1 TONGUE UNDER TONGUE EVERY 5 MINUTES IF NEEDED FOR CHEST PAIN  0  . NOVOLOG 100 UNIT/ML injection   0  . Olmesartan-Amlodipine-HCTZ (TRIBENZOR) 40-5-25 MG TABS Take 1 tablet by mouth daily.     Marland Kitchen. omeprazole (PRILOSEC) 40 MG capsule Take 40 mg by mouth 2 (two) times daily.    Marland Kitchen. PARoxetine (PAXIL-CR) 37.5 MG 24 hr tablet Take 1 tablet (37.5 mg total) by mouth daily. 30 tablet 1  . potassium chloride SA (K-DUR,KLOR-CON) 20 MEQ tablet Take 20 mEq by mouth daily.    . ranolazine (RANEXA) 1000 MG SR tablet Take 500 mg by mouth 2 (two) times daily.    . sitaGLIPtin (JANUVIA) 100 MG tablet Take 100 mg by mouth daily.      . Vitamin D, Ergocalciferol, (DRISDOL) 50000 UNITS CAPS capsule Take 50,000 Units by mouth every 7 (seven) days.     No current facility-administered medications for this visit.     Neurologic: Headache: No Seizure: No Paresthesias: No  Musculoskeletal: Strength & Muscle Tone: within normal limits Gait & Station: normal Patient leans: N/A  Psychiatric Specialty Exam: Review of Systems  Constitutional: Negative.   Skin: Negative for rash.  Neurological: Negative.     Blood pressure 132/76, pulse 69, height 5\' 3"  (1.6 m), weight (!) 312 lb (141.5 kg).Body mass index is 55.27 kg/m.  General Appearance: Casual and Obese  Eye Contact:  Good  Speech:  Clear and Coherent  Volume:  Normal  Mood:  Euthymic  Affect:  Appropriate  Thought Process:  Goal Directed  Orientation:  Full (Time, Place, and Person)  Thought Content: WDL and Logical   Suicidal Thoughts:  No  Homicidal Thoughts:  No  Memory:  Immediate;   Fair Recent;   Good Remote;   Good  Judgement:  Good  Insight:  Good  Psychomotor Activity:  Normal  Concentration:  Concentration:  Good and Attention Span: Good  Recall:  Good  Fund of Knowledge: Good  Language: Good  Akathisia:  No  Handed:  Right  AIMS (if indicated):  0  Assets:  Communication Skills Desire for Improvement Housing Resilience Social Support  ADL's:  Intact  Cognition: WNL  Sleep:  Good   Assessment: Major depressive disorder, recurrent.  Plan: Patient is doing better on Lamictal.  She has no rash or any other side effects.  Recommended to take Lamictal 50 mg and continue Paxil 37.5 mg and Wellbutrin XL 300 mg daily.  Encouraged to see primary care physician for nutritional consult as patient unable to lose weight.  Discussed watching her calorie intake and to regular exercise.  Reminded that if she developed a rash with the Lamictal that she need to call us immediately.  Recommended to call us back if she has any question, concern or if she for worsening of the symptom.  Follow-up in 2 months.   Calden Dorsey T., MD 09/14/2016, 11:08 AM

## 2016-10-03 ENCOUNTER — Telehealth (HOSPITAL_COMMUNITY): Payer: Self-pay

## 2016-10-03 NOTE — Telephone Encounter (Signed)
Patient is calling because her vision is getting blurry off and on. Patient was started on Lamictal and she said you advised her to let you know if she gets a rash or blurry vision. She does not have a rash and the blurry vision started last week and has gotten worse this week. I advised patient to not take her dose of Lamictal until she heard back from me. Please review and advise, thank you

## 2016-10-03 NOTE — Telephone Encounter (Signed)
Unlikely that Lamictal causing blurred vision.  However agree that she should discontinue Lamictal for now to see if vision improved.

## 2016-10-03 NOTE — Telephone Encounter (Signed)
I called patient and let her know to stop the Lamictal for now and we will see if her vision clears up. I told patient to call me Monday and let me know

## 2016-11-16 ENCOUNTER — Ambulatory Visit (INDEPENDENT_AMBULATORY_CARE_PROVIDER_SITE_OTHER): Payer: BC Managed Care – PPO | Admitting: Psychiatry

## 2016-11-16 ENCOUNTER — Encounter (HOSPITAL_COMMUNITY): Payer: Self-pay | Admitting: Psychiatry

## 2016-11-16 VITALS — BP 132/78 | HR 79 | Ht 63.0 in | Wt 320.0 lb

## 2016-11-16 DIAGNOSIS — Z818 Family history of other mental and behavioral disorders: Secondary | ICD-10-CM | POA: Diagnosis not present

## 2016-11-16 DIAGNOSIS — F330 Major depressive disorder, recurrent, mild: Secondary | ICD-10-CM | POA: Diagnosis not present

## 2016-11-16 DIAGNOSIS — Z87891 Personal history of nicotine dependence: Secondary | ICD-10-CM | POA: Diagnosis not present

## 2016-11-16 MED ORDER — ARIPIPRAZOLE 2 MG PO TABS: 2.0000 mg | ORAL_TABLET | Freq: Every day | ORAL | 1 refills | Status: DC

## 2016-11-16 MED ORDER — PAROXETINE HCL ER 37.5 MG PO TB24: 37.5000 mg | ORAL_TABLET | Freq: Every day | ORAL | 1 refills | Status: DC

## 2016-11-16 MED ORDER — BUPROPION HCL ER (XL) 150 MG PO TB24: 150.0000 mg | ORAL_TABLET | Freq: Every day | ORAL | 1 refills | Status: DC

## 2016-11-16 NOTE — Progress Notes (Signed)
BH MD/PA/NP OP Progress Note  11/16/2016 10:16 AM Allison [name]  MRN:  [id number]  Chief Complaint:  Subjective:  I don't think new medicine helping.  I am irritable, angry and having mood swings.  HPI: Allison [name] came for her follow-up appointment.  She noticed that Lamictal is not helping as much.  In the beginning she liked the medication but now she had noticed irritability, anger and mood swings.  She also noticed itching and develop rash one week ago and she scheduled to see her OB/GYN.  She is not sure it related to Lamictal because it is a mild rash and is not spreading.  She still have at times blurry vision and continued to endorse some time seeing shadows and paranoid.  She endorsed job sometimes stressful.  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  She is hoping that she may get some are lax [place] since school is closing.  She is also excited about having first [place] and last week of June.  She still struggle losing her weight and she relies that is not the medicine the causing weight gain because he was she was not taking Lamictal she continued to have weight issue.  She is compliant with Paxil and Wellbutrin.  She is open to try a new medication.  She does not want to go back on Risperdal because it makes her sleepy and she believed cause weight gain.  She was also told recently that she has chronic renal insufficiency but we do not have blood work results.  She is seeing [facility] .  Her appetite is okay.  Her vital signs are stable.    Visit Diagnosis:    ICD-9-CM ICD-10-CM   1. 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 Patient denies any history of suicidal attempt or any inpatient psychiatric treatment.  She had history of mood swing, anger, irritability.   she  has been seeing psychiatrist in this office since 2003.  In the passion he was given Risperdal but she discontinued after feeling better but also cause weight gain.  We also recently try Lamictal but it makes her more irritable , and having itch.   Past Medical History:  Past Medical History:  Diagnosis Date  . Bipolar disorder (HCC)   . Dysphasia   . Hemorrhoids   . History of MRSA infection   . Hyperlipidemia   . Hypokalemia   . Hypotension   . Major depressive disorder   . Migraine headache   . Non-insulin dependent diabetes mellitus   . Non-ST elevated myocardial infarction (non-STEMI) [facility])     Past Surgical History:  Procedure Laterality Date  . CARDIAC CATHETERIZATION  01/03/2010  . ENDOMETRIAL ABLATION  2009   Laser  . ESOPHAGEAL DILATION  2006  . KNEE ARTHROSCOPY  1996   Right  . LAPAROTOMY N/A 12/10/2014   Procedure: EXPLORATORY LAPAROTOMY, APPENDECTOMY, SMALL BOWEL RESECTION, WOUND VAC PLACEMENT;  Surgeon: Emelia Loron, MD;  [facility]: [facility];  Service: General;  Laterality: N/A;  . LAPAROTOMY N/A 12/12/2014   Procedure: EXPLORATORY LAPAROTOMY, RENASTAMOSISED SMALL BOWEL;  Surgeon: Axel Filler, MD;  Location: [facility];  Service: General;  Laterality: N/A;  . VACUUM ASSISTED CLOSURE CHANGE N/A 12/14/2014   Procedure: ABDOMINAL VACUUM CHANGE;  Surgeon: Renae Fickle  Vernell Morgans, MD;  Location: [facility] OR;  Service: General;  Laterality: N/A;  . VACUUM ASSISTED CLOSURE CHANGE N/A 12/16/2014   Procedure: VACUUM CHANGE  AND WOUND CLOSURE ;  Surgeon: Chevis Pretty III, MD;  Location: [facility];  Service: General;  Laterality: N/A;    Family Psychiatric History: Reviewed.  Family History:  Family History  Problem Relation Age of Onset  . Bipolar disorder Sister     Social History:  Social History   Social History  . Marital status: Married    Spouse name: N/A  . Number of children: N/A  . Years of education: N/A   Social History Main Topics  . Smoking status: Former Games developer  .  Smokeless tobacco: Never Used  . Alcohol use No  . Drug use: No  . Sexual activity: Not Asked   Other Topics Concern  . None   Social History Narrative  . None    Allergies:  Allergies  Allergen Reactions  . Sulfonamide Derivatives Anaphylaxis  . Aspirin Nausea And Vomiting  . Penicillins Hives  . Latex Rash    Metabolic Disorder Labs: Lab Results  Component Value Date   HGBA1C 6.0 (H) 12/07/2014   MPG 126 12/07/2014   MPG 289 (H) 01/02/2010   No results found for: PROLACTIN Lab Results  Component Value Date   CHOL (H) 01/02/2010    257        ATP III CLASSIFICATION:  <200     mg/dL   Desirable  161-096  mg/dL   Borderline High  >=045    mg/dL   High          TRIG 409 (H) 12/20/2014   HDL 36 (L) 01/02/2010   CHOLHDL 7.1 01/02/2010   VLDL 68 (H) 01/02/2010   LDLCALC (H) 01/02/2010    153        Total Cholesterol/HDL:CHD Risk Coronary Heart Disease Risk Table                     Men   Women  1/2 Average Risk   3.4   3.3  Average Risk       5.0   4.4  2 X Average Risk   9.6   7.1  3 X Average Risk  23.4   11.0        Use the calculated Patient Ratio above and the CHD Risk Table to determine the patient's CHD Risk.        ATP III CLASSIFICATION (LDL):  <100     mg/dL   Optimal  811-914  mg/dL   Near or Above                    Optimal  130-159  mg/dL   Borderline  782-956  mg/dL   High  >213     mg/dL   Very High     Current Medications: Current Outpatient Prescriptions  Medication Sig Dispense Refill  . acetaminophen (TYLENOL) 500 MG tablet Take 1,000 mg by mouth every 6 (six) hours as needed for mild pain.     Marland Kitchen albuterol (PROVENTIL HFA;VENTOLIN HFA) 108 (90 BASE) MCG/ACT inhaler Inhale 2 puffs into the lungs every 6 (six) hours as needed for wheezing or shortness of breath.    Marland Kitchen aspirin EC 81 MG tablet Take 81 mg by mouth every other day.    Marland Kitchen atorvastatin (LIPITOR) 10 MG tablet Take 10 mg by mouth daily.    Marland Kitchen buPROPion [facility]  XL) 150 MG 24  hr tablet Take 1 tablet (150 mg total) by mouth daily. 30 tablet 1  . Choline Fenofibrate (TRILIPIX) 135 MG capsule Take 135 mg by mouth daily.      . cilostazol (PLETAL) 100 MG tablet Take 100 mg by mouth 2 (two) times daily.    . clopidogrel (PLAVIX) 75 MG tablet Take 75 mg by mouth daily.      . furosemide (LASIX) 20 MG tablet Take 20 mg by mouth 2 (two) times daily.    Marland Kitchen. HYDROcodone-acetaminophen (NORCO) 10-325 MG tablet TAKE 1 TABLET BY MOUTH TWICE A DAY AS NEEDED FOR SEVERE PAIN  0  . insulin glargine (LANTUS) 100 UNIT/ML injection Inject 110 Units into the skin at bedtime.     . lamoTRIgine (LAMICTAL) 25 MG tablet Take 2 tab daily 60 tablet 1  . lubiprostone (AMITIZA) 8 MCG capsule Take 8 mcg by mouth 2 (two) times daily with a meal.    . nitroGLYCERIN (NITROSTAT) 0.4 MG SL tablet PLACE 1 TONGUE UNDER TONGUE EVERY 5 MINUTES IF NEEDED FOR CHEST PAIN  0  . NOVOLOG 100 UNIT/ML injection   0  . Olmesartan-Amlodipine-HCTZ (TRIBENZOR) 40-5-25 MG TABS Take 1 tablet by mouth daily.     Marland Kitchen. omeprazole (PRILOSEC) 40 MG capsule Take 40 mg by mouth 2 (two) times daily.    Marland Kitchen. PARoxetine (PAXIL-CR) 37.5 MG 24 hr tablet Take 1 tablet (37.5 mg total) by mouth daily. 30 tablet 1  . potassium chloride SA (K-DUR,KLOR-CON) 20 MEQ tablet Take 20 mEq by mouth daily.    . ranolazine (RANEXA) 1000 MG SR tablet Take 500 mg by mouth 2 (two) times daily.    . sitaGLIPtin (JANUVIA) 100 MG tablet Take 100 mg by mouth daily.      . Vitamin D, Ergocalciferol, (DRISDOL) 50000 UNITS CAPS capsule Take 50,000 Units by mouth every 7 (seven) days.     No current facility-administered medications for this visit.     Neurologic: Headache: No Seizure: Yes Paresthesias: No  Musculoskeletal: Strength & Muscle Tone: within normal limits Gait & Station: normal Patient leans: N/A  Psychiatric Specialty Exam: Review of Systems  Constitutional: Negative.   HENT: Negative.   Eyes: Positive for blurred vision.  Respiratory:  Negative.   Cardiovascular: Negative.   Skin: Positive for itching and rash.  Neurological: Negative.   Endo/Heme/Allergies: Negative.   Psychiatric/Behavioral: Positive for hallucinations.    Blood pressure 132/78, pulse 79, height 5\' 3"  (1.6 m), weight (!) 320 lb (145.2 kg).Body mass index is 56.69 kg/m.  General Appearance: Casual and Obese  Eye Contact:  Good  Speech:  Clear and Coherent  Volume:  Normal  Mood:  Anxious  Affect:  Labile  Thought Process:  Goal Directed  Orientation:  Full (Time, Place, and Person)  Thought Content: Hallucinations: Visual Endorse some time seeing shadows   Suicidal Thoughts:  No  Homicidal Thoughts:  No  Memory:  Immediate;   Fair Recent;   Good Remote;   Good  Judgement:  Good  Insight:  Good  Psychomotor Activity:  Increased  Concentration:  Concentration: Fair and Attention Span: Fair  Recall:  Good  Fund of Knowledge: Good  Language: Good  Akathisia:  No  Handed:  Right  AIMS (if indicated):  0  Assets:  Communication Skills Desire for Improvement Housing Resilience Social Support Transportation  ADL's:  Intact  Cognition: WNL  Sleep:  Fair     Assessment: Major depressive disorder, recurrent.  Plan: I  review her symptoms, collateral information, past history and current medication.  I will discontinue Lamictal as patient noticed more irritability, itching and recently developed a rash.  She is scheduled to see her OB/GYN to evaluate the rash which is at lower abdominal area.  She is not sure if Lamictal causing rash.  She does not want to go back to Risperdal because she believe it cause weight gain.  She continues to struggle to losing her weight.  We will try low-dose Abilify to help the irritability and depression.  I will continue Wellbutrin XL 300 mg daily and Paxil 37.5 mg daily.  If patient like the Abilify than we will consider lowering Paxil.  I will also get [facility] from [facility] since patient mentioned  that she was diagnosed with mild chronic renal insufficiency.  We do not have blood results.  I recommended to call us back if she has any question, concern or if she feels worsening of the symptom.  Follow-up in 2 months.  Time spent 25 minutes.  Roemello Speyer T., MD 11/16/2016, 10:16 AM

## 2016-11-21 ENCOUNTER — Encounter (HOSPITAL_COMMUNITY): Payer: Self-pay | Admitting: Psychology

## 2017-01-16 ENCOUNTER — Encounter (HOSPITAL_COMMUNITY): Payer: Self-pay | Admitting: Psychiatry

## 2017-01-16 ENCOUNTER — Ambulatory Visit (INDEPENDENT_AMBULATORY_CARE_PROVIDER_SITE_OTHER): Payer: BC Managed Care – PPO | Admitting: Psychiatry

## 2017-01-16 DIAGNOSIS — Z818 Family history of other mental and behavioral disorders: Secondary | ICD-10-CM | POA: Diagnosis not present

## 2017-01-16 DIAGNOSIS — Z87891 Personal history of nicotine dependence: Secondary | ICD-10-CM

## 2017-01-16 DIAGNOSIS — M549 Dorsalgia, unspecified: Secondary | ICD-10-CM

## 2017-01-16 DIAGNOSIS — F330 Major depressive disorder, recurrent, mild: Secondary | ICD-10-CM | POA: Diagnosis not present

## 2017-01-16 MED ORDER — ARIPIPRAZOLE 2 MG PO TABS: 2.0000 mg | ORAL_TABLET | Freq: Every day | ORAL | 2 refills | Status: DC

## 2017-01-16 MED ORDER — BUPROPION HCL ER (XL) 150 MG PO TB24: 150.0000 mg | ORAL_TABLET | Freq: Every day | ORAL | 2 refills | Status: DC

## 2017-01-16 MED ORDER — PAROXETINE HCL ER 25 MG PO TB24: 25.0000 mg | ORAL_TABLET | Freq: Every day | ORAL | 2 refills | Status: DC

## 2017-01-16 NOTE — Progress Notes (Signed)
BH MD/PA/NP OP Progress Note  01/16/2017 11:17 AM LEVON PENNING  MRN:  [id number]  Chief Complaint:  Subjective:  I like new medication.  I have more energy.  HPI: Naiara came for her follow-up appointment.  On her last visit we discontinued Lamictal because she was complaining of itching and blurry vision.  We started her on low-dose Abilify.  She is feeling much better.  She is less depressed and less irritable.  She is pleased that she no longer having hallucination and paranoia feeling.  She is happy that she has a [place] who is now 54 weeks old.  She still struggle losing her weight.  She is compliant with Paxil Wellbutrin.  She has no tremors shakes or any EPS.  Her anxiety is much better.  We received records from [facility].  She has blood work on October 20 2016.  Her hemoglobin A1c is 5.9.  Her cholesterol is high but she cannot take Zetia due to expense.  On her last visit she told that she has chronic renal insufficiency but her BUN and creatinine is normal.  Patient like to continue her Abilify.  She has no tremors or shakes.  She is no longer taking Lamictal.  Her appetite is okay.  She is trying to lose weight  Visit Diagnosis:    ICD-10-CM   1. Major depressive disorder, recurrent episode, mild (HCC) F33.0 PARoxetine (PAXIL-CR) 25 MG 24 hr tablet    buPROPion (WELLBUTRIN XL) 150 MG 24 hr tablet    ARIPiprazole (ABILIFY) 2 MG tablet    Past Psychiatric History: Reviewed. Patient denies any history of suicidal attempt or any inpatient psychiatric treatment. She had history of mood swing, anger, irritability.  she has been seeing psychiatrist in this office since 2003.  In the passion he was given Risperdal but she discontinued after feeling better but also cause weight gain.  We also recently try Lamictal but it makes her more irritable , and having itch.   Past Medical History:  Past Medical History:  Diagnosis Date  . Bipolar disorder (HCC)   . Dysphasia   .  Hemorrhoids   . History of MRSA infection   . Hyperlipidemia   . Hypokalemia   . Hypotension   . Major depressive disorder   . Migraine headache   . Non-insulin dependent diabetes mellitus   . Non-ST elevated myocardial infarction (non-STEMI) [facility] [place])     Past Surgical History:  Procedure Laterality Date  . CARDIAC CATHETERIZATION  01/03/2010  . ENDOMETRIAL ABLATION  2009   Laser  . ESOPHAGEAL DILATION  2006  . KNEE ARTHROSCOPY  1996   Right  . LAPAROTOMY N/A 12/10/2014   Procedure: EXPLORATORY LAPAROTOMY, APPENDECTOMY, SMALL BOWEL RESECTION, WOUND VAC PLACEMENT;  Surgeon: Emelia Loron, MD;  Location: [facility] OR;  Service: General;  Laterality: N/A;  . LAPAROTOMY N/A 12/12/2014   Procedure: EXPLORATORY LAPAROTOMY, RENASTAMOSISED SMALL BOWEL;  Surgeon: Axel Filler, MD;  Location: [facility] OR;  Service: General;  Laterality: N/A;  . VACUUM ASSISTED CLOSURE CHANGE N/A 12/14/2014   Procedure: ABDOMINAL VACUUM CHANGE;  Surgeon: Chevis Pretty III, MD;  Location: [facility] OR;  Service: General;  Laterality: N/A;  . VACUUM ASSISTED CLOSURE CHANGE N/A 12/16/2014   Procedure: VACUUM CHANGE  AND WOUND CLOSURE ;  Surgeon: Chevis Pretty III, MD;  Location: [facility] OR;  Service: General;  Laterality: N/A;    Family Psychiatric History: Reviewed.  Family History:  Family History  Problem Relation Age of Onset  . Bipolar disorder  Sister     Social History:  Social History   Social History  . Marital status: Married    Spouse name: N/A  . Number of children: N/A  . Years of education: N/A   Social History Main Topics  . Smoking status: Former Games developermoker  . Smokeless tobacco: Never Used  . Alcohol use No  . Drug use: No  . Sexual activity: Not on file   Other Topics Concern  . Not on file   Social History Narrative  . No narrative on file    Allergies:  Allergies  Allergen Reactions  . Sulfonamide Derivatives Anaphylaxis  . Aspirin Nausea And Vomiting  . Penicillins Hives  . Latex Rash    Metabolic  Disorder Labs: Lab Results  Component Value Date   HGBA1C 6.0 (H) 12/07/2014   MPG 126 12/07/2014   MPG 289 (H) 01/02/2010   No results found for: PROLACTIN Lab Results  Component Value Date   CHOL (H) 01/02/2010    257        ATP III CLASSIFICATION:  <200     mg/dL   Desirable  [id number]  mg/dL   Borderline High  >=664>=240    mg/dL   High          TRIG 403288 (H) 12/20/2014   HDL 36 (L) 01/02/2010   CHOLHDL 7.1 01/02/2010   VLDL 68 (H) 01/02/2010   LDLCALC (H) 01/02/2010    153        Total Cholesterol/HDL:CHD Risk Coronary Heart Disease Risk Table                     Men   Women  1/2 Average Risk   3.4   3.3  Average Risk       5.0   4.4  2 X Average Risk   9.6   7.1  3 X Average Risk  23.4   11.0        Use the calculated Patient Ratio above and the CHD Risk Table to determine the patient's CHD Risk.        ATP III CLASSIFICATION (LDL):  <100     mg/dL   Optimal  474-259100-129  mg/dL   Near or Above                    Optimal  130-159  mg/dL   Borderline  563-875160-189  mg/dL   High  >643>190     mg/dL   Very High     Current Medications: Current Outpatient Prescriptions  Medication Sig Dispense Refill  . acetaminophen (TYLENOL) 500 MG tablet Take 1,000 mg by mouth every 6 (six) hours as needed for mild pain.     Marland Kitchen. albuterol (PROVENTIL HFA;VENTOLIN HFA) 108 (90 BASE) MCG/ACT inhaler Inhale 2 puffs into the lungs every 6 (six) hours as needed for wheezing or shortness of breath.    . ARIPiprazole (ABILIFY) 2 MG tablet Take 1 tablet (2 mg total) by mouth daily. 30 tablet 1  . aspirin EC 81 MG tablet Take 81 mg by mouth every other day.    Marland Kitchen. atorvastatin (LIPITOR) 10 MG tablet Take 10 mg by mouth daily.    Marland Kitchen. buPROPion (WELLBUTRIN XL) 150 MG 24 hr tablet Take 1 tablet (150 mg total) by mouth daily. 30 tablet 1  . Choline Fenofibrate (TRILIPIX) 135 MG capsule Take 135 mg by mouth daily.      . cilostazol (PLETAL) 100 MG tablet Take 100  mg by mouth 2 (two) times daily.    . clopidogrel  (PLAVIX) 75 MG tablet Take 75 mg by mouth daily.      . furosemide (LASIX) 20 MG tablet Take 20 mg by mouth 2 (two) times daily.    Marland Kitchen. HYDROcodone-acetaminophen (NORCO) 10-325 MG tablet TAKE 1 TABLET BY MOUTH TWICE A DAY AS NEEDED FOR SEVERE PAIN  0  . insulin glargine (LANTUS) 100 UNIT/ML injection Inject 110 Units into the skin at bedtime.     Marland Kitchen. lubiprostone (AMITIZA) 8 MCG capsule Take 8 mcg by mouth 2 (two) times daily with a meal.    . nitroGLYCERIN (NITROSTAT) 0.4 MG SL tablet PLACE 1 TONGUE UNDER TONGUE EVERY 5 MINUTES IF NEEDED FOR CHEST PAIN  0  . NOVOLOG 100 UNIT/ML injection   0  . Olmesartan-Amlodipine-HCTZ (TRIBENZOR) 40-5-25 MG TABS Take 1 tablet by mouth daily.     Marland Kitchen. omeprazole (PRILOSEC) 40 MG capsule Take 40 mg by mouth 2 (two) times daily.    Marland Kitchen. PARoxetine (PAXIL-CR) 37.5 MG 24 hr tablet Take 1 tablet (37.5 mg total) by mouth daily. 30 tablet 1  . potassium chloride SA (K-DUR,KLOR-CON) 20 MEQ tablet Take 20 mEq by mouth daily.    . ranolazine (RANEXA) 1000 MG SR tablet Take 500 mg by mouth 2 (two) times daily.    . sitaGLIPtin (JANUVIA) 100 MG tablet Take 100 mg by mouth daily.      . Vitamin D, Ergocalciferol, (DRISDOL) 50000 UNITS CAPS capsule Take 50,000 Units by mouth every 7 (seven) days.     No current facility-administered medications for this visit.     Neurologic: Headache: No Seizure: No Paresthesias: No  Musculoskeletal: Strength & Muscle Tone: within normal limits Gait & Station: normal Patient leans: N/A  Psychiatric Specialty Exam: Review of Systems  Constitutional: Negative.   HENT: Negative.   Respiratory: Negative.   Cardiovascular: Negative.   Gastrointestinal: Negative.   Musculoskeletal: Positive for back pain.  Skin: Negative for itching and rash.  Neurological: Negative.     Blood pressure 140/80, pulse 81, height 5' 2.5" (1.588 m), weight (!) 322 lb (146.1 kg).There is no height or weight on file to calculate BMI.  General Appearance:  Casual  Eye Contact:  Good  Speech:  Clear and Coherent  Volume:  Normal  Mood:  Anxious  Affect:  Congruent  Thought Process:  Goal Directed  Orientation:  Full (Time, Place, and Person)  Thought Content: Logical   Suicidal Thoughts:  No  Homicidal Thoughts:  No  Memory:  Immediate;   Good Recent;   Good Remote;   Good  Judgement:  Good  Insight:  Good  Psychomotor Activity:  Normal  Concentration:  Concentration: Fair and Attention Span: Fair  Recall:  Good  Fund of Knowledge: Good  Language: Good  Akathisia:  No  Handed:  Right  AIMS (if indicated):  0  Assets:  Communication Skills Desire for Improvement Housing Resilience Social Support  ADL's:  Intact  Cognition: WNL  Sleep:  Improved     Assessment: Major depressive disorder, recurrent.  Rule out bipolar disorder depressed type.  Plan: [facility] results.  On 10/20/2016 her BUN is 16, creatinine 1.0.  Hemoglobin A1c 5.9, cholesterol 224, triglyceride 158, HDL 60 and LDL 132.  She continues to struggle losing weight.  I'll continue Abilify 2 mg since it is working good.  However we'll reduce Paxil 25 mg and continue Wellbutrin XL  300 mg daily.  Discuss in length medication side effects and benefits.  Patient is not interested in counseling.  Recommended to call us back if she has any question, concern or if she feels worsening of the symptom.  Follow-up in 3 months. Time spent 25 minutes.  More than 50% of the time spent in psychoeducation, counseling and coordination of care.  Discuss safety plan that anytime having active suicidal thoughts or homicidal thoughts then patient need to call 911 or go to the local emergency room.    Trayshawn Durkin T., MD 01/16/2017, 11:17 AM

## 2017-03-04 ENCOUNTER — Other Ambulatory Visit (HOSPITAL_COMMUNITY): Payer: Self-pay | Admitting: Family Medicine

## 2017-03-04 ENCOUNTER — Other Ambulatory Visit (HOSPITAL_COMMUNITY): Payer: Self-pay | Admitting: Cardiology

## 2017-03-04 DIAGNOSIS — R079 Chest pain, unspecified: Secondary | ICD-10-CM

## 2017-03-13 ENCOUNTER — Encounter (HOSPITAL_COMMUNITY): Payer: BC Managed Care – PPO

## 2017-03-14 ENCOUNTER — Other Ambulatory Visit (HOSPITAL_COMMUNITY): Payer: Self-pay

## 2017-04-18 ENCOUNTER — Encounter (HOSPITAL_COMMUNITY): Payer: Self-pay | Admitting: Psychiatry

## 2017-04-18 ENCOUNTER — Ambulatory Visit (INDEPENDENT_AMBULATORY_CARE_PROVIDER_SITE_OTHER): Payer: BC Managed Care – PPO | Admitting: Psychiatry

## 2017-04-18 DIAGNOSIS — Z818 Family history of other mental and behavioral disorders: Secondary | ICD-10-CM

## 2017-04-18 DIAGNOSIS — Z87891 Personal history of nicotine dependence: Secondary | ICD-10-CM

## 2017-04-18 DIAGNOSIS — Z79899 Other long term (current) drug therapy: Secondary | ICD-10-CM | POA: Diagnosis not present

## 2017-04-18 DIAGNOSIS — F330 Major depressive disorder, recurrent, mild: Secondary | ICD-10-CM

## 2017-04-18 MED ORDER — BUPROPION HCL ER (XL) 150 MG PO TB24: 150.0000 mg | ORAL_TABLET | Freq: Every day | ORAL | 2 refills | Status: DC

## 2017-04-18 MED ORDER — ARIPIPRAZOLE 2 MG PO TABS: 2.0000 mg | ORAL_TABLET | Freq: Every day | ORAL | 2 refills | Status: DC

## 2017-04-18 MED ORDER — PAROXETINE HCL ER 25 MG PO TB24: 25.0000 mg | ORAL_TABLET | Freq: Every day | ORAL | 2 refills | Status: DC

## 2017-04-18 NOTE — Progress Notes (Signed)
BH MD/PA/NP OP Progress Note  04/18/2017 10:46 AM Allison [name]  MRN:  [id number]  Chief Complaint:  Chief Complaint    Follow-up     HPI: patient came for her follow-up appointment.  She is taking her medication and denies any side effects.  She admitted weight gain and sometime she gets tired and exhausted.  She is thinking to retire next year.  Patient is a school bus traveler for 30 years.Today she refuses for weight but she endorsed that she has been working to cut down her weight by watching what she is eating and try to do exercise.  Overall she described her mood is good.  She denies any irritability, anger, mania or any psychosis. She is seeing physician at [facility].  Her last hemoglobin A1c was 6.3.  She is scheduled to have another blood work on 15.  She did he like Abilify because it is helping her anger and mood swings.  She has no tremors shakes or any EPS.  She enjoys spending time with her grandbaby.  She wants to continue her current psychiatric medication.   Visit Diagnosis:    ICD-10-CM   1. Major depressive disorder, recurrent episode, mild (HCC) F33.0 buPROPion (WELLBUTRIN XL) 150 MG 24 hr tablet    ARIPiprazole (ABILIFY) 2 MG tablet    PARoxetine (PAXIL-CR) 25 MG 24 hr tablet    Past Psychiatric History: reviewed. Patient denies any history of suicidal attempt or any inpatient psychiatric treatment. She had history of mood swing, anger, irritability. she has been seeing psychiatrist in this office since 2003. In the passion he was given Risperdal but she discontinued after feeling better but also cause weight gain. We also recently try Lamictal but it makes her more irritable , and having itch.   Past Medical History:  Past Medical History:  Diagnosis Date  . Bipolar disorder (HCC)   . Dysphasia   . Hemorrhoids   . History of MRSA infection   . Hyperlipidemia   . Hypokalemia   . Hypotension   . Major depressive disorder   . Migraine headache    . Non-insulin dependent diabetes mellitus   . Non-ST elevated myocardial infarction (non-STEMI) [facility])     Past Surgical History:  Procedure Laterality Date  . CARDIAC CATHETERIZATION  01/03/2010  . ENDOMETRIAL ABLATION  2009   Laser  . ESOPHAGEAL DILATION  2006  . KNEE ARTHROSCOPY  1996   Right  . LAPAROTOMY N/A 12/10/2014   Procedure: EXPLORATORY LAPAROTOMY, APPENDECTOMY, SMALL BOWEL RESECTION, WOUND VAC PLACEMENT;  Surgeon: Emelia Loron, MD;  Location: [facility] OR;  Service: General;  Laterality: N/A;  . LAPAROTOMY N/A 12/12/2014   Procedure: EXPLORATORY LAPAROTOMY, RENASTAMOSISED SMALL BOWEL;  Surgeon: Axel Filler, MD;  Location: [facility] OR;  Service: General;  Laterality: N/A;  . VACUUM ASSISTED CLOSURE CHANGE N/A 12/14/2014   Procedure: ABDOMINAL VACUUM CHANGE;  Surgeon: Chevis Pretty III, MD;  Location: [facility] OR;  Service: General;  Laterality: N/A;  . VACUUM ASSISTED CLOSURE CHANGE N/A 12/16/2014   Procedure: VACUUM CHANGE  AND WOUND CLOSURE ;  Surgeon: Chevis Pretty III, MD;  Location: [facility] OR;  Service: General;  Laterality: N/A;    Family Psychiatric History: eviewed.  Family History:  Family History  Problem Relation Age of Onset  . Bipolar disorder Sister     Social History:  Social History   Social History  . Marital status: Married    Spouse name: N/A  . Number of children: N/A  . Years of  education: N/A   Social History Main Topics  . Smoking status: Former Games developermoker  . Smokeless tobacco: Never Used  . Alcohol use No  . Drug use: No  . Sexual activity: Yes    Birth control/ protection: None   Other Topics Concern  . None   Social History Narrative  . None    Allergies:  Allergies  Allergen Reactions  . Sulfonamide Derivatives Anaphylaxis  . Aspirin Nausea And Vomiting  . Penicillins Hives  . Latex Rash    Metabolic Disorder Labs: Lab Results  Component Value Date   HGBA1C 6.0 (H) 12/07/2014   MPG 126 12/07/2014   MPG 289 (H) 01/02/2010   No results  found for: PROLACTIN Lab Results  Component Value Date   CHOL (H) 01/02/2010    257        ATP III CLASSIFICATION:  <200     mg/dL   Desirable  960-454200-239  mg/dL   Borderline High  >=098>=240    mg/dL   High          TRIG 119288 (H) 12/20/2014   HDL 36 (L) 01/02/2010   CHOLHDL 7.1 01/02/2010   VLDL 68 (H) 01/02/2010   LDLCALC (H) 01/02/2010    153        Total Cholesterol/HDL:CHD Risk Coronary Heart Disease Risk Table                     Men   Women  1/2 Average Risk   3.4   3.3  Average Risk       5.0   4.4  2 X Average Risk   9.6   7.1  3 X Average Risk  23.4   11.0        Use the calculated Patient Ratio above and the CHD Risk Table to determine the patient's CHD Risk.        ATP III CLASSIFICATION (LDL):  <100     mg/dL   Optimal  147-829100-129  mg/dL   Near or Above                    Optimal  130-159  mg/dL   Borderline  562-130160-189  mg/dL   High  >865>190     mg/dL   Very High   Lab Results  Component Value Date   TSH 0.848 01/02/2010   TSH 0.848 01/02/2010    Therapeutic Level Labs: No results found for: LITHIUM No results found for: VALPROATE No components found for:  CBMZ  Current Medications: Current Outpatient Prescriptions  Medication Sig Dispense Refill  . acetaminophen (TYLENOL) 500 MG tablet Take 1,000 mg by mouth every 6 (six) hours as needed for mild pain.     Marland Kitchen. albuterol (PROVENTIL HFA;VENTOLIN HFA) 108 (90 BASE) MCG/ACT inhaler Inhale 2 puffs into the lungs every 6 (six) hours as needed for wheezing or shortness of breath.    . ARIPiprazole (ABILIFY) 2 MG tablet Take 1 tablet (2 mg total) by mouth daily. 30 tablet 2  . aspirin EC 81 MG tablet Take 81 mg by mouth every other day.    Marland Kitchen. atorvastatin (LIPITOR) 10 MG tablet Take 10 mg by mouth daily.    Marland Kitchen. buPROPion (WELLBUTRIN XL) 150 MG 24 hr tablet Take 1 tablet (150 mg total) by mouth daily. 30 tablet 2  . Choline Fenofibrate (TRILIPIX) 135 MG capsule Take 135 mg by mouth daily.      . cilostazol (PLETAL) 100 MG  tablet Take 100 mg by mouth 2 (two) times daily.    . clopidogrel (PLAVIX) 75 MG tablet Take 75 mg by mouth daily.      . furosemide (LASIX) 20 MG tablet Take 20 mg by mouth 2 (two) times daily.    Marland Kitchen HYDROcodone-acetaminophen (NORCO) 10-325 MG tablet TAKE 1 TABLET BY MOUTH TWICE A DAY AS NEEDED FOR SEVERE PAIN  0  . insulin glargine (LANTUS) 100 UNIT/ML injection Inject 110 Units into the skin at bedtime.     Marland Kitchen lubiprostone (AMITIZA) 8 MCG capsule Take 8 mcg by mouth 2 (two) times daily with a meal.    . nitroGLYCERIN (NITROSTAT) 0.4 MG SL tablet PLACE 1 TONGUE UNDER TONGUE EVERY 5 MINUTES IF NEEDED FOR CHEST PAIN  0  . NOVOLOG 100 UNIT/ML injection   0  . Olmesartan-Amlodipine-HCTZ (TRIBENZOR) 40-5-25 MG TABS Take 1 tablet by mouth daily.     Marland Kitchen omeprazole (PRILOSEC) 40 MG capsule Take 40 mg by mouth 2 (two) times daily.    Marland Kitchen PARoxetine (PAXIL-CR) 25 MG 24 hr tablet Take 1 tablet (25 mg total) by mouth daily. 30 tablet 2  . potassium chloride SA (K-DUR,KLOR-CON) 20 MEQ tablet Take 20 mEq by mouth daily.    . ranolazine (RANEXA) 1000 MG SR tablet Take 500 mg by mouth 2 (two) times daily.    . sitaGLIPtin (JANUVIA) 100 MG tablet Take 100 mg by mouth daily.      . Vitamin D, Ergocalciferol, (DRISDOL) 50000 UNITS CAPS capsule Take 50,000 Units by mouth every 7 (seven) days.     No current facility-administered medications for this visit.      Musculoskeletal: Strength & Muscle Tone: within normal limits Gait & Station: normal Patient leans: N/A  Psychiatric Specialty Exam: Review of Systems  Musculoskeletal: Positive for back pain and joint pain.  Neurological: Positive for tingling.    Blood pressure 136/82, pulse 72, height 5\' 2"  (1.575 m).There is no height or weight on file to calculate BMI.  General Appearance: Casual  Eye Contact:  Good  Speech:  Clear and Coherent  Volume:  Normal  Mood:  tired  Affect:  Appropriate  Thought Process:  Goal Directed  Orientation:  Full (Time,  Place, and Person)  Thought Content: Logical   Suicidal Thoughts:  No  Homicidal Thoughts:  No  Memory:  Immediate;   Good Recent;   Good Remote;   Good  Judgement:  Good  Insight:  Good  Psychomotor Activity:  Normal  Concentration:  Concentration: Fair and Attention Span: Good  Recall:  Good  Fund of Knowledge: Good  Language: Good  Akathisia:  No  Handed:  Right  AIMS (if indicated): not done  Assets:  Communication Skills Desire for Improvement Housing Social Support  ADL's:  Intact  Cognition: WNL  Sleep:  Good   Screenings:   Assessment and Plan: major depressive disorder, recurrent.  Rule out bipolar disorder depressed type.  Patient is a stable on her current psychiatric medication.  Reinforce watching her diet and calories intake.  She is working on it.  She wants to continue her current psychiatric medication which is helping her mood.  Continue Abilify 2 mg daily, Wellbutrin XL 300 mg daily and Paxil 25 mg daily.  Recommended to call us back if she has any question or any concern.  Follow-up in 3 months. He is not interested in counseling.   Deshun Sedivy T., MD 04/18/2017, 10:46 AM

## 2017-07-15 ENCOUNTER — Other Ambulatory Visit (HOSPITAL_COMMUNITY): Payer: Self-pay | Admitting: Psychiatry

## 2017-07-15 DIAGNOSIS — F330 Major depressive disorder, recurrent, mild: Secondary | ICD-10-CM

## 2017-07-23 ENCOUNTER — Ambulatory Visit (INDEPENDENT_AMBULATORY_CARE_PROVIDER_SITE_OTHER): Payer: BC Managed Care – PPO | Admitting: Psychiatry

## 2017-07-23 ENCOUNTER — Encounter (HOSPITAL_COMMUNITY): Payer: Self-pay | Admitting: Psychiatry

## 2017-07-23 DIAGNOSIS — F330 Major depressive disorder, recurrent, mild: Secondary | ICD-10-CM

## 2017-07-23 DIAGNOSIS — Z818 Family history of other mental and behavioral disorders: Secondary | ICD-10-CM | POA: Diagnosis not present

## 2017-07-23 MED ORDER — PAROXETINE HCL ER 25 MG PO TB24: 25.0000 mg | ORAL_TABLET | Freq: Every day | ORAL | 2 refills | Status: DC

## 2017-07-23 MED ORDER — BREXPIPRAZOLE 1 MG PO TABS: 1.0000 mg | ORAL_TABLET | Freq: Every day | ORAL | 2 refills | Status: DC

## 2017-07-23 MED ORDER — BUPROPION HCL ER (XL) 150 MG PO TB24: 150.0000 mg | ORAL_TABLET | Freq: Every day | ORAL | 2 refills | Status: DC

## 2017-07-23 NOTE — Progress Notes (Signed)
BH MD/PA/NP OP Progress Note  07/23/2017 10:06 AM KIRSTYN LEAN  MRN:  [id number]  Chief Complaint: I am doing fine but I cannot afford Abilify.  It is expensive.  My insurance does not cover brand name either and generic is tier 3.  HPI: Mykela came for her follow-up appointment.  She is taking her medication and she is feeling better with Abilify but she is concerned about the cost.  Her insurance does not cover brand name Abilify and generic is tier 3.  Overall she describes her mood is good.  She denies any irritability, anger, mania or any psychosis.  However she admitted weight gain and feeling easily tired and exhausted.  She does not feel her sleep apnea machine working very well because she get restlessness in the night.  She is hoping to get retirement this year.  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  She had a good Christmas.  She is seeing physician at [facility] and she told her hemoglobin A1c is stable.  Patient denies any anger or any crying spells.  She denies any tremors, shakes or any EPS.  She wants to continue Wellbutrin and Paxil and wondering if she can try something else as she cannot afford Abilify.  Patient denies drinking alcohol or using any illegal substances.  Her energy level is fair.    Visit Diagnosis:    ICD-10-CM   1. Major depressive disorder, recurrent episode, mild (HCC) F33.0 PARoxetine (PAXIL-CR) 25 MG 24 hr tablet    buPROPion (WELLBUTRIN XL) 150 MG 24 hr tablet    Brexpiprazole (REXULTI) 1 MG TABS    Past Psychiatric History: Reviewed Patient denies any history of suicidal attempt or any inpatient psychiatric treatment. She had history of mood swing, anger, irritability. she has been seeing psychiatrist in this office since 2003. In the passion he was given Risperdal but she discontinued after feeling better but also cause weight gain. We also recently try Lamictal but it makes her more irritable , and having itch.    Past Medical History:  Past Medical History:  Diagnosis Date  . Bipolar disorder (HCC)   . Dysphasia   . Hemorrhoids   . History of MRSA infection   . Hyperlipidemia   . Hypokalemia   . Hypotension   . Major depressive disorder   . Migraine headache   . Non-insulin dependent diabetes mellitus   . Non-ST elevated myocardial infarction (non-STEMI) [facility])     Past Surgical History:  Procedure Laterality Date  . CARDIAC CATHETERIZATION  01/03/2010  . ENDOMETRIAL ABLATION  2009   Laser  . ESOPHAGEAL DILATION  2006  . KNEE ARTHROSCOPY  1996   Right  . LAPAROTOMY N/A 12/10/2014   Procedure: EXPLORATORY LAPAROTOMY, APPENDECTOMY, SMALL BOWEL RESECTION, WOUND VAC PLACEMENT;  Surgeon: Emelia Loron, MD;  Location: [facility] OR;  Service: General;  Laterality: N/A;  . LAPAROTOMY N/A 12/12/2014   Procedure: EXPLORATORY LAPAROTOMY, RENASTAMOSISED SMALL BOWEL;  Surgeon: Axel Filler, MD;  Location: [facility] OR;  Service: General;  Laterality: N/A;  . VACUUM ASSISTED CLOSURE CHANGE N/A 12/14/2014   Procedure: ABDOMINAL VACUUM CHANGE;  Surgeon: Chevis Pretty III, MD;  Location: [facility] OR;  Service: General;  Laterality: N/A;  . VACUUM ASSISTED CLOSURE CHANGE N/A 12/16/2014   Procedure: VACUUM CHANGE  AND WOUND CLOSURE ;  Surgeon: Chevis Pretty III, MD;  Location: [facility] OR;  Service: General;  Laterality: N/A;    Family Psychiatric History: Reviewed.  Family  History:  Family History  Problem Relation Age of Onset  . Bipolar disorder Sister     Social History:  Social History   Socioeconomic History  . Marital status: Married    Spouse name: None  . Number of children: None  . Years of education: None  . Highest education level: None  Social Needs  . Financial resource strain: None  . Food insecurity - worry: None  . Food insecurity - inability: None  . Transportation needs - medical: None  . Transportation needs - non-medical: None  Occupational History  . None  Tobacco Use  . Smoking status:  Former Smoker    Last attempt to quit: 12/23/2009    Years since quitting: 7.5  . Smokeless tobacco: Never Used  Substance and Sexual Activity  . Alcohol use: No    Alcohol/week: 0.0 oz  . Drug use: No  . Sexual activity: Yes    Partners: Male    Birth control/protection: None  Other Topics Concern  . None  Social History Narrative  . None    Allergies:  Allergies  Allergen Reactions  . Sulfonamide Derivatives Anaphylaxis  . Aspirin Nausea And Vomiting  . Penicillins Hives  . Latex Rash    Metabolic Disorder Labs: Lab Results  Component Value Date   HGBA1C 6.0 (H) 12/07/2014   MPG 126 12/07/2014   MPG 289 (H) 01/02/2010   No results found for: PROLACTIN Lab Results  Component Value Date   CHOL (H) 01/02/2010    257        ATP III CLASSIFICATION:  <200     mg/dL   Desirable  161-096  mg/dL   Borderline High  >=045    mg/dL   High          TRIG 409 (H) 12/20/2014   HDL 36 (L) 01/02/2010   CHOLHDL 7.1 01/02/2010   VLDL 68 (H) 01/02/2010   LDLCALC (H) 01/02/2010    153        Total Cholesterol/HDL:CHD Risk Coronary Heart Disease Risk Table                     Men   Women  1/2 Average Risk   3.4   3.3  Average Risk       5.0   4.4  2 X Average Risk   9.6   7.1  3 X Average Risk  23.4   11.0        Use the calculated Patient Ratio above and the CHD Risk Table to determine the patient's CHD Risk.        ATP III CLASSIFICATION (LDL):  <100     mg/dL   Optimal  811-914  mg/dL   Near or Above                    Optimal  130-159  mg/dL   Borderline  782-956  mg/dL   High  >213     mg/dL   Very High   Lab Results  Component Value Date   TSH 0.848 01/02/2010   TSH 0.848 01/02/2010    Therapeutic Level Labs: No results found for: LITHIUM No results found for: VALPROATE No components found for:  CBMZ  Current Medications: Current Outpatient Medications  Medication Sig Dispense Refill  . acetaminophen (TYLENOL) 500 MG tablet Take 1,000 mg by mouth  every 6 (six) hours as needed for mild pain.     Marland Kitchen albuterol (PROVENTIL HFA;VENTOLIN  HFA) 108 (90 BASE) MCG/ACT inhaler Inhale 2 puffs into the lungs every 6 (six) hours as needed for wheezing or shortness of breath.    . ARIPiprazole (ABILIFY) 2 MG tablet Take 1 tablet (2 mg total) by mouth daily. 30 tablet 2  . aspirin EC 81 MG tablet Take 81 mg by mouth every other day.    Marland Kitchen atorvastatin (LIPITOR) 10 MG tablet Take 10 mg by mouth daily.    Marland Kitchen buPROPion (WELLBUTRIN XL) 150 MG 24 hr tablet Take 1 tablet (150 mg total) by mouth daily. 30 tablet 2  . Choline Fenofibrate (TRILIPIX) 135 MG capsule Take 135 mg by mouth daily.      . cilostazol (PLETAL) 100 MG tablet Take 100 mg by mouth 2 (two) times daily.    . clopidogrel (PLAVIX) 75 MG tablet Take 75 mg by mouth daily.      . furosemide (LASIX) 20 MG tablet Take 20 mg by mouth 2 (two) times daily.    Marland Kitchen HYDROcodone-acetaminophen (NORCO) 10-325 MG tablet TAKE 1 TABLET BY MOUTH TWICE A DAY AS NEEDED FOR SEVERE PAIN  0  . insulin glargine (LANTUS) 100 UNIT/ML injection Inject 110 Units into the skin at bedtime.     Marland Kitchen lubiprostone (AMITIZA) 8 MCG capsule Take 8 mcg by mouth 2 (two) times daily with a meal.    . metoprolol tartrate (LOPRESSOR) 50 MG tablet Take 50 mg by mouth 2 (two) times daily.    . nitroGLYCERIN (NITROSTAT) 0.4 MG SL tablet PLACE 1 TONGUE UNDER TONGUE EVERY 5 MINUTES IF NEEDED FOR CHEST PAIN  0  . NOVOLOG 100 UNIT/ML injection   0  . Olmesartan-Amlodipine-HCTZ (TRIBENZOR) 40-5-25 MG TABS Take 1 tablet by mouth daily.     Marland Kitchen omeprazole (PRILOSEC) 40 MG capsule Take 40 mg by mouth 2 (two) times daily.    Marland Kitchen PARoxetine (PAXIL-CR) 25 MG 24 hr tablet Take 1 tablet (25 mg total) by mouth daily. 30 tablet 2  . potassium chloride SA (K-DUR,KLOR-CON) 20 MEQ tablet Take 20 mEq by mouth daily.    . ranolazine (RANEXA) 1000 MG SR tablet Take 500 mg by mouth 2 (two) times daily.    . sitaGLIPtin (JANUVIA) 100 MG tablet Take 100 mg by mouth daily.       . Vitamin D, Ergocalciferol, (DRISDOL) 50000 UNITS CAPS capsule Take 50,000 Units by mouth every 7 (seven) days.     No current facility-administered medications for this visit.      Musculoskeletal: Strength & Muscle Tone: within normal limits Gait & Station: normal Patient leans: N/A  Psychiatric Specialty Exam: Review of Systems  Constitutional:       Tired  Musculoskeletal: Positive for back pain and joint pain.  Neurological: Positive for tingling.    Blood pressure (!) 142/68, pulse 81, height 5' 2.5" (1.588 m), SpO2 93 %.Body mass index is 57.96 kg/m.  General Appearance: Casual and Tired  Eye Contact:  Fair  Speech:  Clear and Coherent  Volume:  Normal  Mood:  Tired  Affect:  Appropriate  Thought Process:  Goal Directed  Orientation:  Full (Time, Place, and Person)  Thought Content: Logical   Suicidal Thoughts:  No  Homicidal Thoughts:  No  Memory:  Immediate;   Good Recent;   Good Remote;   Good  Judgement:  Good  Insight:  Good  Psychomotor Activity:  Normal  Concentration:  Concentration: Fair and Attention Span: Fair  Recall:  Good  Fund of Knowledge: Good  Language:  Good  Akathisia:  No  Handed:  Right  AIMS (if indicated): not done  Assets:  Communication Skills Desire for Improvement Housing Transportation  ADL's:  Intact  Cognition: WNL  Sleep:  Fair   Screenings:   Assessment and Plan: Major depressive disorder, recurrent.  Rule out bipolar disorder depressed type.  I will discontinue Abilify as patient cannot afford.  We will try REXULTI 0.5 mg for 1 week and then 1 mg daily.  We will provide samples and co-pay card.  Continue Wellbutrin XL 300 mg daily and Paxil 25 mg daily.  Encourage to watch her calorie intake and weight loss.  Encouraged to see patient for sleep apnea as patient does not feel her sleep apnea machine is working very well.  Recommended to call us back if she has any question or any concern.  Follow-up in 3 months.   Patient is not interested in counseling.     Cleotis NipperSyed T Nadege Carriger, MD 07/23/2017, 10:06 AM

## 2017-10-04 ENCOUNTER — Emergency Department (HOSPITAL_BASED_OUTPATIENT_CLINIC_OR_DEPARTMENT_OTHER)
Admission: EM | Admit: 2017-10-04 | Discharge: 2017-10-04 | Disposition: A | Discharge status: Home / Self Care | Payer: No Typology Code available for payment source | Attending: Emergency Medicine | Admitting: Emergency Medicine

## 2017-10-04 ENCOUNTER — Encounter (HOSPITAL_BASED_OUTPATIENT_CLINIC_OR_DEPARTMENT_OTHER): Payer: Self-pay

## 2017-10-04 ENCOUNTER — Other Ambulatory Visit: Payer: Self-pay

## 2017-10-04 DIAGNOSIS — Y929 Unspecified place or not applicable: Secondary | ICD-10-CM | POA: Insufficient documentation

## 2017-10-04 DIAGNOSIS — M5431 Sciatica, right side: Secondary | ICD-10-CM | POA: Diagnosis not present

## 2017-10-04 DIAGNOSIS — I2510 Atherosclerotic heart disease of native coronary artery without angina pectoris: Secondary | ICD-10-CM | POA: Insufficient documentation

## 2017-10-04 DIAGNOSIS — Z91040 Latex allergy status: Secondary | ICD-10-CM | POA: Insufficient documentation

## 2017-10-04 DIAGNOSIS — Z7902 Long term (current) use of antithrombotics/antiplatelets: Secondary | ICD-10-CM | POA: Insufficient documentation

## 2017-10-04 DIAGNOSIS — Z7982 Long term (current) use of aspirin: Secondary | ICD-10-CM | POA: Insufficient documentation

## 2017-10-04 DIAGNOSIS — Z87891 Personal history of nicotine dependence: Secondary | ICD-10-CM | POA: Diagnosis not present

## 2017-10-04 DIAGNOSIS — M62830 Muscle spasm of back: Secondary | ICD-10-CM | POA: Insufficient documentation

## 2017-10-04 DIAGNOSIS — Y9389 Activity, other specified: Secondary | ICD-10-CM | POA: Insufficient documentation

## 2017-10-04 DIAGNOSIS — Y990 Civilian activity done for income or pay: Secondary | ICD-10-CM | POA: Insufficient documentation

## 2017-10-04 DIAGNOSIS — Z794 Long term (current) use of insulin: Secondary | ICD-10-CM | POA: Diagnosis not present

## 2017-10-04 DIAGNOSIS — Z79899 Other long term (current) drug therapy: Secondary | ICD-10-CM | POA: Insufficient documentation

## 2017-10-04 DIAGNOSIS — X500XXA Overexertion from strenuous movement or load, initial encounter: Secondary | ICD-10-CM | POA: Diagnosis not present

## 2017-10-04 DIAGNOSIS — E119 Type 2 diabetes mellitus without complications: Secondary | ICD-10-CM | POA: Insufficient documentation

## 2017-10-04 DIAGNOSIS — I252 Old myocardial infarction: Secondary | ICD-10-CM | POA: Diagnosis not present

## 2017-10-04 DIAGNOSIS — M545 Low back pain: Secondary | ICD-10-CM | POA: Diagnosis present

## 2017-10-04 MED ORDER — METHOCARBAMOL 500 MG PO TABS
750.0000 mg | ORAL_TABLET | Freq: Once | ORAL | Status: AC
  Administered 2017-10-04: 750 mg via ORAL
  Filled 2017-10-04: qty 2

## 2017-10-04 MED ORDER — KETOROLAC TROMETHAMINE 60 MG/2ML IM SOLN
60.0000 mg | Freq: Once | INTRAMUSCULAR | Status: AC
  Administered 2017-10-04: 60 mg via INTRAMUSCULAR
  Filled 2017-10-04: qty 2

## 2017-10-04 MED ORDER — METHOCARBAMOL 500 MG PO TABS: 500.0000 mg | ORAL_TABLET | Freq: Three times a day (TID) | ORAL | 0 refills | Status: DC | PRN

## 2017-10-04 MED ORDER — HYDROMORPHONE HCL 1 MG/ML IJ SOLN
1.0000 mg | Freq: Once | INTRAMUSCULAR | Status: AC
  Administered 2017-10-04: 1 mg via INTRAMUSCULAR
  Filled 2017-10-04: qty 1

## 2017-10-04 MED ORDER — LIDOCAINE 5 % EX PTCH: 1.0000 | MEDICATED_PATCH | CUTANEOUS | 0 refills | Status: DC

## 2017-10-04 NOTE — ED Triage Notes (Signed)
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

## 2017-10-04 NOTE — Discharge Instructions (Signed)
You can also try a heating pad.  Try to continue your activities as normal the best you can.  Do not drive while taking these medications.

## 2017-10-04 NOTE — ED Provider Notes (Signed)
[facility] EMERGENCY DEPARTMENT Provider Note   CSN: [id number] Arrival date & time: 10/04/17  1948     History   Chief Complaint Chief Complaint  Patient presents with  . Back Pain    HPI Allison [name] is a 57 y.o. female.  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  Patient states that she was holding her and the girl jerked and she felt a pop in her lower back.  Since that time she has had pain in the right side of her back that when she moves certain positions will radiate down her leg.  Pain is an 8 out of 10 and worse with standing and certain movements.  She states it is very painful to raise her legs but she is able to walk.  She denies any numbness of the legs.  She has had no urinary incontinence or retention and no fecal incontinence.  She takes hydrocodone daily for her chronic knee pain but has not had any because did not want to mask the symptoms of her back pain.  She has had prior issues with her back but was not having pain prior to this event today.  The history is provided by the patient.  Back Pain   This is a new problem. The current episode started 3 to 5 hours ago. The problem occurs constantly. The problem has not changed since onset.The pain is associated with twisting (jerk). The pain is present in the lumbar spine. The quality of the pain is described as shooting and aching. The pain radiates to the right thigh and right knee. The pain is at a severity of 8/10. The pain is severe. The symptoms are aggravated by bending, certain positions and twisting. Associated symptoms include leg pain. Pertinent negatives include no chest pain, no fever, no abdominal pain, no abdominal swelling, no bowel incontinence, no bladder incontinence, no dysuria and no weakness. She has tried  nothing for the symptoms. The treatment provided no relief.    Past Medical History:  Diagnosis Date  . Bipolar disorder (HCC)   . Dysphasia   . Hemorrhoids   . History of MRSA infection   . Hyperlipidemia   . Hypokalemia   . Hypotension   . Major depressive disorder   . Migraine headache   . Non-insulin dependent diabetes mellitus   . Non-ST elevated myocardial infarction (non-STEMI) [facility])     Patient Active Problem List   Diagnosis Date Noted  . Pressure ulcer 12/16/2014  . Hypokalemia 12/08/2014  . Morbid obesity (HCC) 12/08/2014  . 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  . DM type 2, uncontrolled, with renal complications (HCC) 01/18/2010  . CORONARY ATHEROSCLEROSIS, NATIVE VESSEL 01/18/2010  . DENTAL CARIES 01/18/2010  . HYPERTENSION, WHITE COAT 01/18/2010    Past Surgical History:  Procedure Laterality Date  . CARDIAC CATHETERIZATION  01/03/2010  . ENDOMETRIAL ABLATION  2009   Laser  . ESOPHAGEAL DILATION  2006  . KNEE ARTHROSCOPY  1996   Right  . LAPAROTOMY N/A 12/10/2014   Procedure: EXPLORATORY LAPAROTOMY, APPENDECTOMY, SMALL BOWEL RESECTION, WOUND VAC PLACEMENT;  Surgeon: Emelia Loron, MD;  Location: [facility] OR;  Service: General;  Laterality: N/A;  . LAPAROTOMY N/A 12/12/2014   Procedure: EXPLORATORY LAPAROTOMY, RENASTAMOSISED SMALL BOWEL;  Surgeon: Axel Filler, MD;  Location: [facility] OR;  Service: General;  Laterality: N/A;  . VACUUM ASSISTED CLOSURE CHANGE N/A 12/14/2014   Procedure: ABDOMINAL VACUUM CHANGE;  Surgeon: Chevis PrettyPaul Toth III, MD;  Location: [facility] OR;  Service: General;  Laterality: N/A;  . VACUUM ASSISTED CLOSURE CHANGE N/A 12/16/2014   Procedure: VACUUM CHANGE  AND WOUND CLOSURE ;  Surgeon: Chevis PrettyPaul Toth III, MD;  Location: [facility] OR;  Service: General;  Laterality: N/A;     OB History   None      Home Medications    Prior to Admission medications   Medication Sig Start Date End Date Taking? Authorizing Provider  acetaminophen (TYLENOL) 500 MG tablet Take  1,000 mg by mouth every 6 (six) hours as needed for mild pain.     [provider]  albuterol (PROVENTIL HFA;VENTOLIN HFA) 108 (90 BASE) MCG/ACT inhaler Inhale 2 puffs into the lungs every 6 (six) hours as needed for wheezing or shortness of breath.    [provider]  aspirin EC 81 MG tablet Take 81 mg by mouth every other day.    [provider]  atorvastatin (LIPITOR) 10 MG tablet Take 10 mg by mouth daily.    [provider]  Brexpiprazole (REXULTI) 1 MG TABS Take 1 tablet (1 mg total) by mouth daily. 07/23/17   Arfeen, Phillips GroutSyed T, MD  buPROPion (WELLBUTRIN XL) 150 MG 24 hr tablet Take 1 tablet (150 mg total) by mouth daily. 07/23/17   Arfeen, Phillips GroutSyed T, MD  Choline Fenofibrate (TRILIPIX) 135 MG capsule Take 135 mg by mouth daily.      [provider]  cilostazol (PLETAL) 100 MG tablet Take 100 mg by mouth 2 (two) times daily.    [provider]  clopidogrel (PLAVIX) 75 MG tablet Take 75 mg by mouth daily.      [provider]  furosemide (LASIX) 20 MG tablet Take 20 mg by mouth 2 (two) times daily.    [provider]  HYDROcodone-acetaminophen (NORCO) 10-325 MG tablet TAKE 1 TABLET BY MOUTH TWICE A DAY AS NEEDED FOR SEVERE PAIN 07/22/15   [provider]  insulin glargine (LANTUS) 100 UNIT/ML injection Inject 110 Units into the skin at bedtime.     [provider]  lidocaine (LIDODERM) 5 % Place 1 patch onto the skin daily. Remove & Discard patch within 12 hours or as directed by MD 10/04/17   Gwyneth SproutPlunkett, Dari Carpenito, MD  lubiprostone (AMITIZA) 8 MCG capsule Take 8 mcg by mouth 2 (two) times daily with a meal.    [provider]  methocarbamol (ROBAXIN) 500 MG tablet Take 1 tablet (500 mg total) by mouth every 8 (eight) hours as needed for muscle spasms. 10/04/17   Gwyneth SproutPlunkett, Zaion Hreha, MD  metoprolol tartrate (LOPRESSOR) 50 MG tablet Take 50 mg by mouth 2 (two) times daily.    Coralee Ruduran, Michael R, PA-C  nitroGLYCERIN  (NITROSTAT) 0.4 MG SL tablet PLACE 1 TONGUE UNDER TONGUE EVERY 5 MINUTES IF NEEDED FOR CHEST PAIN 02/16/16   [provider]  NOVOLOG 100 UNIT/ML injection  04/24/16   [provider]  Olmesartan-Amlodipine-HCTZ (TRIBENZOR) 40-5-25 MG TABS Take 1 tablet by mouth daily.     [provider]  omeprazole (PRILOSEC) 40 MG capsule Take 40 mg by mouth 2 (two) times daily.    [provider]  PARoxetine (PAXIL-CR) 25 MG 24 hr tablet Take 1 tablet (25 mg total) by mouth daily. 07/23/17   Arfeen, Phillips GroutSyed T, MD  potassium chloride SA (K-DUR,KLOR-CON) 20 MEQ tablet Take 20 mEq by mouth daily.  [provider]  ranolazine (RANEXA) 1000 MG SR tablet Take 500 mg by mouth 2 (two) times daily.    [provider]  sitaGLIPtin (JANUVIA) 100 MG tablet Take 100 mg by mouth daily.      [provider]  Vitamin D, Ergocalciferol, (DRISDOL) 50000 UNITS CAPS capsule Take 50,000 Units by mouth every 7 (seven) days.    [provider]    Family History Family History  Problem Relation Age of Onset  . Bipolar disorder Sister     Social History Social History   Tobacco Use  . Smoking status: Former Smoker    Last attempt to quit: 12/23/2009    Years since quitting: 7.7  . Smokeless tobacco: Never Used  Substance Use Topics  . Alcohol use: No    Alcohol/week: 0.0 oz  . Drug use: No     Allergies   Sulfonamide derivatives; Aspirin; Penicillins; and Latex   Review of Systems Review of Systems  Constitutional: Negative for fever.  Cardiovascular: Negative for chest pain.  Gastrointestinal: Negative for abdominal pain and bowel incontinence.  Genitourinary: Negative for bladder incontinence and dysuria.  Musculoskeletal: Positive for back pain.  Neurological: Negative for weakness.  All other systems reviewed and are negative.    Physical Exam Updated Vital Signs BP (!) 141/56 (BP Location: Right Wrist)   Pulse 70   Temp 98.6 F (37  C) (Oral)   Resp 20   Ht 5\' 2"  (1.575 m)   Wt (!) 164.5 kg (362 lb 10.5 oz)   SpO2 96%   BMI 66.33 kg/m   Physical Exam  Constitutional: She is oriented to person, place, and time. She appears well-developed and well-nourished. No distress.  Morbidly obese  HENT:  Head: Normocephalic and atraumatic.  Mouth/Throat: Oropharynx is clear and moist.  Eyes: Pupils are equal, round, and reactive to light. Conjunctivae and EOM are normal.  Neck: Normal range of motion. Neck supple.  Cardiovascular: Normal rate, regular rhythm and intact distal pulses.  No murmur heard. Pulmonary/Chest: Effort normal and breath sounds normal. No respiratory distress. She has no wheezes. She has no rales.  Abdominal: Soft. She exhibits no distension. There is no tenderness. There is no rebound and no guarding.  Musculoskeletal: She exhibits tenderness. She exhibits no edema.       Right hip: Normal.       Left hip: Normal.       Lumbar back: She exhibits decreased range of motion, tenderness, pain and spasm. She exhibits no bony tenderness and normal pulse.       Back:  Pain with straight leg raises bilaterally.  No hip pain  Neurological: She is alert and oriented to person, place, and time.  Skin: Skin is warm and dry. No rash noted. No erythema.  Psychiatric: She has a normal mood and affect. Her behavior is normal.  Nursing note and vitals reviewed.    ED Treatments / Results  Labs (all labs ordered are listed, but only abnormal results are displayed) Labs Reviewed - No data to display  EKG None  Radiology No results found.  Procedures Procedures (including critical care time)  Medications Ordered in ED Medications  ketorolac (TORADOL) injection 60 mg (60 mg Intramuscular Given 10/04/17 2110)  HYDROmorphone (DILAUDID) injection 1 mg (1 mg Intramuscular Given 10/04/17 2110)  methocarbamol (ROBAXIN) tablet 750 mg (750 mg Oral Given 10/04/17 2110)     Initial Impression / Assessment and  Plan / ED Course  I have reviewed the  triage vital signs and the nursing notes.  Pertinent labs & imaging results that were available during my care of the patient were reviewed by me and considered in my medical decision making (see chart for details).     Pt with sudden onset of back pain suggestive of radiculopathy vs muscle strain.  No neurovascular compromise and no incontinence.  Pt has no infectious sx, hx of CA  or other red flags concerning for pathologic back pain.  Pt is able to ambulate but is painful.  Normal strength and reflexes on exam.   Will give pt pain control and to return for developement of above sx.   Final Clinical Impressions(s) / ED Diagnoses   Final diagnoses:  Sciatica of right side  Muscle spasm of back    ED Discharge Orders        Ordered    methocarbamol (ROBAXIN) 500 MG tablet  Every 8 hours PRN     10/04/17 2100    lidocaine (LIDODERM) 5 %  Every 24 hours     10/04/17 2101       Gwyneth Sprout, MD 10/04/17 2129

## 2017-10-15 ENCOUNTER — Ambulatory Visit (HOSPITAL_COMMUNITY): Payer: BC Managed Care – PPO | Admitting: Psychiatry

## 2017-10-17 ENCOUNTER — Other Ambulatory Visit (HOSPITAL_COMMUNITY): Payer: Self-pay | Admitting: Psychiatry

## 2017-10-17 DIAGNOSIS — F330 Major depressive disorder, recurrent, mild: Secondary | ICD-10-CM

## 2017-10-22 ENCOUNTER — Other Ambulatory Visit (HOSPITAL_COMMUNITY): Payer: Self-pay

## 2017-10-22 DIAGNOSIS — F330 Major depressive disorder, recurrent, mild: Secondary | ICD-10-CM

## 2017-10-22 MED ORDER — PAROXETINE HCL ER 25 MG PO TB24: 25.0000 mg | ORAL_TABLET | Freq: Every day | ORAL | 0 refills | Status: DC

## 2017-10-22 MED ORDER — BUPROPION HCL ER (XL) 150 MG PO TB24: 150.0000 mg | ORAL_TABLET | Freq: Every day | ORAL | 0 refills | Status: DC

## 2017-11-01 ENCOUNTER — Encounter (HOSPITAL_COMMUNITY): Payer: Self-pay | Admitting: Psychiatry

## 2017-11-01 ENCOUNTER — Ambulatory Visit (INDEPENDENT_AMBULATORY_CARE_PROVIDER_SITE_OTHER): Payer: BC Managed Care – PPO | Admitting: Psychiatry

## 2017-11-01 DIAGNOSIS — Z818 Family history of other mental and behavioral disorders: Secondary | ICD-10-CM

## 2017-11-01 DIAGNOSIS — F330 Major depressive disorder, recurrent, mild: Secondary | ICD-10-CM

## 2017-11-01 DIAGNOSIS — Z87891 Personal history of nicotine dependence: Secondary | ICD-10-CM | POA: Diagnosis not present

## 2017-11-01 MED ORDER — BUPROPION HCL ER (XL) 150 MG PO TB24: 150.0000 mg | ORAL_TABLET | Freq: Every day | ORAL | 2 refills | Status: DC

## 2017-11-01 MED ORDER — BREXPIPRAZOLE 1 MG PO TABS: 1.0000 mg | ORAL_TABLET | Freq: Every day | ORAL | 2 refills | Status: DC

## 2017-11-01 MED ORDER — PAROXETINE HCL ER 25 MG PO TB24: 25.0000 mg | ORAL_TABLET | Freq: Every day | ORAL | 2 refills | Status: DC

## 2017-11-01 NOTE — Progress Notes (Signed)
BH MD/PA/NP OP Progress Note  11/01/2017 10:55 AM Allison [name]  MRN:  [id number]  Chief Complaint: I hurt at work.  I am out of work.  HPI: Allison [name] came for her follow-up appointment.  On her last visit we tried REXULTI as patient cannot afford Abilify.  She is using coupon and samples and she is feeling better.  She like the REXULTI but she is only taking 0.5.  She still have irritability, mood swings.  Recently she hurt at work when she was moving one child and hurt her back.  She is seeing [facility] and currently she is restricted to go back to work.  She is scheduled to have MRI.  Patient told since not back to work her stress level is less.  However there are nights when she cannot sleep well because of back pain.  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  She also scheduled to see her primary care physician Dr. Katrinka Blazing at [facility] for blood work next month.  She had gained a lot of weight in past 6 months.  Patient denies any agitation, mania, psychosis.  She denies any tremors or shakes.  She still gets some time frustrated and irritable and get easily tired.  Patient denies any suicidal thoughts or homicidal thought.  Visit Diagnosis:    ICD-10-CM   1. Major depressive disorder, recurrent episode, mild (HCC) F33.0 Brexpiprazole (REXULTI) 1 MG TABS    buPROPion (WELLBUTRIN XL) 150 MG 24 hr tablet    PARoxetine (PAXIL-CR) 25 MG 24 hr tablet    Past Psychiatric History: Reviewed. Patient denies any history of suicidal attempt or any inpatient psychiatric treatment. She had history of mood swing, anger, irritability. she has been seeing psychiatrist in this office since 2003. In the past she tried Risperdal but after feeling better it was discontinued.  We tried Lamictal but it caused itching.      Past Medical History:  Past Medical History:  Diagnosis Date  . Bipolar disorder (HCC)   . Dysphasia   . Hemorrhoids   . History  of MRSA infection   . Hyperlipidemia   . Hypokalemia   . Hypotension   . Major depressive disorder   . Migraine headache   . Non-insulin dependent diabetes mellitus   . Non-ST elevated myocardial infarction (non-STEMI) [facility])     Past Surgical History:  Procedure Laterality Date  . CARDIAC CATHETERIZATION  01/03/2010  . ENDOMETRIAL ABLATION  2009   Laser  . ESOPHAGEAL DILATION  2006  . KNEE ARTHROSCOPY  1996   Right  . LAPAROTOMY N/A 12/10/2014   Procedure: EXPLORATORY LAPAROTOMY, APPENDECTOMY, SMALL BOWEL RESECTION, WOUND VAC PLACEMENT;  Surgeon: Emelia Loron, MD;  Location: [facility];  Service: General;  Laterality: N/A;  . LAPAROTOMY N/A 12/12/2014   Procedure: EXPLORATORY LAPAROTOMY, RENASTAMOSISED SMALL BOWEL;  Surgeon: Axel Filler, MD;  Location: [facility] OR;  Service: General;  Laterality: N/A;  . VACUUM ASSISTED CLOSURE CHANGE N/A 12/14/2014   Procedure: ABDOMINAL VACUUM CHANGE;  Surgeon: Chevis Pretty III, MD;  Location: [facility] OR;  Service: General;  Laterality: N/A;  . VACUUM ASSISTED CLOSURE CHANGE N/A 12/16/2014   Procedure: VACUUM CHANGE  AND WOUND CLOSURE ;  Surgeon: Chevis Pretty III, MD;  Location: [facility];  Service: General;  Laterality: N/A;    Family Psychiatric History: Reviewed.  Family History:  Family History  Problem Relation Age of Onset  . Bipolar disorder Sister  Social History:  Social History   Socioeconomic History  . Marital status: Married    Spouse name: Not on file  . Number of children: Not on file  . Years of education: Not on file  . Highest education level: Not on file  Occupational History  . Not on file  Social Needs  . Financial resource strain: Not on file  . Food insecurity:    Worry: Not on file    Inability: Not on file  . Transportation needs:    Medical: Not on file    Non-medical: Not on file  Tobacco Use  . Smoking status: Former Smoker    Last attempt to quit: 12/23/2009    Years since quitting: 7.8  . Smokeless tobacco: Never  Used  Substance and Sexual Activity  . Alcohol use: No    Alcohol/week: 0.0 oz  . Drug use: No  . Sexual activity: Not on file  Lifestyle  . Physical activity:    Days per week: Not on file    Minutes per session: Not on file  . Stress: Not on file  Relationships  . Social connections:    Talks on phone: Not on file    Gets together: Not on file    Attends religious service: Not on file    Active member of club or organization: Not on file    Attends meetings of clubs or organizations: Not on file    Relationship status: Not on file  Other Topics Concern  . Not on file  Social History Narrative  . Not on file    Allergies:  Allergies  Allergen Reactions  . Sulfonamide Derivatives Anaphylaxis  . Aspirin Nausea And Vomiting  . Penicillins Hives  . Latex Rash    Metabolic Disorder Labs: Lab Results  Component Value Date   HGBA1C 6.0 (H) 12/07/2014   MPG 126 12/07/2014   MPG 289 (H) 01/02/2010   No results found for: PROLACTIN Lab Results  Component Value Date   CHOL (H) 01/02/2010    257        ATP III CLASSIFICATION:  <200     mg/dL   Desirable  161-096  mg/dL   Borderline High  >=045    mg/dL   High          TRIG 409 (H) 12/20/2014   HDL 36 (L) 01/02/2010   CHOLHDL 7.1 01/02/2010   VLDL 68 (H) 01/02/2010   LDLCALC (H) 01/02/2010    153        Total Cholesterol/HDL:CHD Risk Coronary Heart Disease Risk Table                     Men   Women  1/2 Average Risk   3.4   3.3  Average Risk       5.0   4.4  2 X Average Risk   9.6   7.1  3 X Average Risk  23.4   11.0        Use the calculated Patient Ratio above and the CHD Risk Table to determine the patient's CHD Risk.        ATP III CLASSIFICATION (LDL):  <100     mg/dL   Optimal  811-914  mg/dL   Near or Above                    Optimal  130-159  mg/dL   Borderline  782-956  mg/dL   High  >213  mg/dL   Very High   Lab Results  Component Value Date   TSH 0.848 01/02/2010   TSH 0.848 01/02/2010     Therapeutic Level Labs: No results found for: LITHIUM No results found for: VALPROATE No components found for:  CBMZ  Current Medications: Current Outpatient Medications  Medication Sig Dispense Refill  . acetaminophen (TYLENOL) 500 MG tablet Take 1,000 mg by mouth every 6 (six) hours as needed for mild pain.     Marland Kitchen albuterol (PROVENTIL HFA;VENTOLIN HFA) 108 (90 BASE) MCG/ACT inhaler Inhale 2 puffs into the lungs every 6 (six) hours as needed for wheezing or shortness of breath.    Marland Kitchen aspirin EC 81 MG tablet Take 81 mg by mouth every other day.    Marland Kitchen atorvastatin (LIPITOR) 10 MG tablet Take 10 mg by mouth daily.    . Brexpiprazole (REXULTI) 1 MG TABS Take 1 tablet (1 mg total) by mouth daily. 30 tablet 2  . buPROPion (WELLBUTRIN XL) 150 MG 24 hr tablet Take 1 tablet (150 mg total) by mouth daily. 30 tablet 0  . Choline Fenofibrate (TRILIPIX) 135 MG capsule Take 135 mg by mouth daily.      . cilostazol (PLETAL) 100 MG tablet Take 100 mg by mouth 2 (two) times daily.    . clopidogrel (PLAVIX) 75 MG tablet Take 75 mg by mouth daily.      . cyclobenzaprine (FLEXERIL) 5 MG tablet Take 5 mg by mouth at bedtime.    . furosemide (LASIX) 20 MG tablet Take 20 mg by mouth 2 (two) times daily.    Marland Kitchen HYDROcodone-acetaminophen (NORCO) 10-325 MG tablet TAKE 1 TABLET BY MOUTH TWICE A DAY AS NEEDED FOR SEVERE PAIN  0  . insulin glargine (LANTUS) 100 UNIT/ML injection Inject 110 Units into the skin at bedtime.     . lidocaine (LIDODERM) 5 % Place 1 patch onto the skin daily. Remove & Discard patch within 12 hours or as directed by MD 30 patch 0  . lubiprostone (AMITIZA) 8 MCG capsule Take 8 mcg by mouth 2 (two) times daily with a meal.    . methocarbamol (ROBAXIN) 500 MG tablet Take 1 tablet (500 mg total) by mouth every 8 (eight) hours as needed for muscle spasms. 20 tablet 0  . metoprolol tartrate (LOPRESSOR) 50 MG tablet Take 50 mg by mouth 2 (two) times daily.    . nitroGLYCERIN (NITROSTAT) 0.4 MG SL  tablet PLACE 1 TONGUE UNDER TONGUE EVERY 5 MINUTES IF NEEDED FOR CHEST PAIN  0  . NOVOLOG 100 UNIT/ML injection   0  . Olmesartan-Amlodipine-HCTZ (TRIBENZOR) 40-5-25 MG TABS Take 1 tablet by mouth daily.     Marland Kitchen omeprazole (PRILOSEC) 40 MG capsule Take 40 mg by mouth 2 (two) times daily.    Marland Kitchen PARoxetine (PAXIL-CR) 25 MG 24 hr tablet Take 1 tablet (25 mg total) by mouth daily. 30 tablet 0  . potassium chloride SA (K-DUR,KLOR-CON) 20 MEQ tablet Take 20 mEq by mouth daily.    . ranolazine (RANEXA) 1000 MG SR tablet Take 500 mg by mouth 2 (two) times daily.    . sitaGLIPtin (JANUVIA) 100 MG tablet Take 100 mg by mouth daily.      . Vitamin D, Ergocalciferol, (DRISDOL) 50000 UNITS CAPS capsule Take 50,000 Units by mouth every 7 (seven) days.     No current facility-administered medications for this visit.      Musculoskeletal: Strength & Muscle Tone: within normal limits Gait & Station: normal Patient leans: N/A  Psychiatric Specialty Exam: ROS  Blood pressure (!) 148/88, pulse 72, height $Remo (1.6 m), weight (!) 360 lb (163.3 kg).Body mass index is 63.77 kg/m.  General Appearance: Casual and Tired and morbid obese  Eye Contact:  Fair  Speech:  Slow  Volume:  Tired  Mood:  Euthymic  Affect:  Congruent  Thought Process:  Goal Directed  Orientation:  Full (Time, Place, and Person)  Thought Content: Rumination   Suicidal Thoughts:  No  Homicidal Thoughts:  No  Memory:  Immediate;   Good Recent;   Good Remote;   Good  Judgement:  Good  Insight:  Good  Psychomotor Activity:  Normal  Concentration:  Concentration: Fair and Attention Span: Fair  Recall:  Good  Fund of Knowledge: Good  Language: Good  Akathisia:  No  Handed:  Right  AIMS (if indicated): not done  Assets:  Communication Skills Desire for Improvement Housing Social Support  ADL's:  Intact  Cognition: WNL  Sleep:  Fair   Screenings:   Assessment and Plan: Major depressive disorder, recurrent.  Rule out  bipolar disorder.  Patient doing better on REXULTI however I recommended to take 1 mg to help residual mood lability.  Patient currently restricted from her work because she hurt her back and scheduled to have MRI and to see pain doctor.  I encouraged to keep appointment with her primary care physician as patient continued to gain weight.  Continue Wellbutrin XL 300 mg daily and Paxil 25 mg daily.  Patient is not interested in counseling.  Recommended to call us back if she has any question or any concern.  Follow-up in 3 months   Cleotis Nipper, MD 11/01/2017, 10:55 AM

## 2017-12-04 ENCOUNTER — Other Ambulatory Visit: Payer: Self-pay | Admitting: Family Medicine

## 2017-12-04 DIAGNOSIS — Z1231 Encounter for screening mammogram for malignant neoplasm of breast: Secondary | ICD-10-CM

## 2017-12-27 ENCOUNTER — Ambulatory Visit: Payer: Self-pay

## 2018-02-03 ENCOUNTER — Ambulatory Visit (INDEPENDENT_AMBULATORY_CARE_PROVIDER_SITE_OTHER): Payer: BC Managed Care – PPO | Admitting: Psychiatry

## 2018-02-03 ENCOUNTER — Encounter (HOSPITAL_COMMUNITY): Payer: Self-pay | Admitting: Psychiatry

## 2018-02-03 VITALS — BP 136/73 | HR 83 | Ht 63.0 in

## 2018-02-03 DIAGNOSIS — Z87891 Personal history of nicotine dependence: Secondary | ICD-10-CM

## 2018-02-03 DIAGNOSIS — G4700 Insomnia, unspecified: Secondary | ICD-10-CM

## 2018-02-03 DIAGNOSIS — F330 Major depressive disorder, recurrent, mild: Secondary | ICD-10-CM | POA: Diagnosis not present

## 2018-02-03 DIAGNOSIS — F3341 Major depressive disorder, recurrent, in partial remission: Secondary | ICD-10-CM

## 2018-02-03 DIAGNOSIS — R450 Nervousness: Secondary | ICD-10-CM

## 2018-02-03 DIAGNOSIS — R0602 Shortness of breath: Secondary | ICD-10-CM

## 2018-02-03 MED ORDER — BREXPIPRAZOLE 1 MG PO TABS: 1.0000 mg | ORAL_TABLET | Freq: Every day | ORAL | 2 refills | Status: DC

## 2018-02-03 MED ORDER — BUPROPION HCL ER (XL) 150 MG PO TB24: 150.0000 mg | ORAL_TABLET | Freq: Every day | ORAL | 2 refills | Status: DC

## 2018-02-03 MED ORDER — PAROXETINE HCL ER 25 MG PO TB24: 25.0000 mg | ORAL_TABLET | Freq: Every day | ORAL | 2 refills | Status: DC

## 2018-02-03 NOTE — Progress Notes (Signed)
BH MD/PA/NP OP Progress Note  02/03/2018 11:26 AM Allison [name]  MRN:  [id number]  Chief Complaint:  Chief Complaint    Medication Refill    I am doing ok.  HPI: Allison [name] came for her follow-up appointment.  She has maintained on Rexulti in addition to Paxil CR and Wellbutrin XL, and is feeling better.  She has been able to purchase Rexulti with coupon.  Reports fewer mood swings and less irritability.  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 She is seeing [facility] and currently she is restricted to go back to work. She  Remains on workman's comp, and thinks that she will retire after FMLA in order to spend more time with grandson, 25 year old, in [place].  She reports sleep and appetite are ok.  She is using CPAP for OSA.  She states that she takes Rexulti at night and it helps with sleep. Patient denies any agitation, mania, psychosis. Patient denies any suicidal thoughts or homicidal thought. She denies any tremors or shakes.  She gets easily tired, and reports she has "heart disease".  She follows with her primary care physician Dr. Katrinka [name] at [facility] for health maintenance and blood work.   Visit Diagnosis:    ICD-10-CM   1. MDD (major depressive disorder), recurrent, in partial remission (HCC) F33.41   2. Major depressive disorder, recurrent episode, mild (HCC) F33.0 Brexpiprazole (REXULTI) 1 MG TABS    buPROPion (WELLBUTRIN XL) 150 MG 24 hr tablet    PARoxetine (PAXIL-CR) 25 MG 24 hr tablet    Past Psychiatric History: Reviewed. Patient denies any history of suicidal attempt or any inpatient psychiatric treatment. She had history of mood swing, anger, irritability. she has been seeing psychiatrist in this office since 2003. In the past she tried Risperdal but after feeling better it was discontinued.  We tried Lamictal but it caused itching.    Unable to afford  Abilify   Past Medical History:  Past Medical History:  Diagnosis Date  . Bipolar disorder (HCC)   . Dysphasia   . Hemorrhoids   . History of MRSA infection   . Hyperlipidemia   . Hypokalemia   . Hypotension   . Major depressive disorder   . Migraine headache   . Non-insulin dependent diabetes mellitus   . Non-ST elevated myocardial infarction (non-STEMI) [facility])     Past Surgical History:  Procedure Laterality Date  . CARDIAC CATHETERIZATION  01/03/2010  . ENDOMETRIAL ABLATION  2009   Laser  . ESOPHAGEAL DILATION  2006  . KNEE ARTHROSCOPY  1996   Right  . LAPAROTOMY N/A 12/10/2014   Procedure: EXPLORATORY LAPAROTOMY, APPENDECTOMY, SMALL BOWEL RESECTION, WOUND VAC PLACEMENT;  Surgeon: Emelia Loron, MD;  Location: [facility] OR;  Service: General;  Laterality: N/A;  . LAPAROTOMY N/A 12/12/2014   Procedure: EXPLORATORY LAPAROTOMY, RENASTAMOSISED SMALL BOWEL;  Surgeon: Axel Filler, MD;  Location: [facility];  Service: General;  Laterality: N/A;  . VACUUM ASSISTED CLOSURE CHANGE N/A 12/14/2014   Procedure: ABDOMINAL VACUUM CHANGE;  Surgeon: Chevis Pretty III, MD;  Location: [facility];  Service: General;  Laterality: N/A;  . VACUUM ASSISTED CLOSURE CHANGE N/A 12/16/2014   Procedure: VACUUM CHANGE  AND WOUND CLOSURE ;  Surgeon: Chevis Pretty III, MD;  Location: [facility];  Service: General;  Laterality: N/A;    Family Psychiatric History: Reviewed.  Family History:  Family History  Problem Relation Age of Onset  . Bipolar disorder Sister     Social History:  Lives with husband.  Social History   Socioeconomic History  . Marital status: Married    Spouse name: Not on file  . Number of children: Not on file  . Years of education: Not on file  . Highest education level: Not on file  Occupational History  . Not on file  Social Needs  . Financial resource strain: Not on file  . Food insecurity:    Worry: Not on file    Inability: Not on file  . Transportation needs:    Medical: Not on file     Non-medical: Not on file  Tobacco Use  . Smoking status: Former Smoker    Last attempt to quit: 12/23/2009    Years since quitting: 8.1  . Smokeless tobacco: Never Used  Substance and Sexual Activity  . Alcohol use: No    Alcohol/week: 0.0 standard drinks  . Drug use: No  . Sexual activity: Not on file  Lifestyle  . Physical activity:    Days per week: Not on file    Minutes per session: Not on file  . Stress: Not on file  Relationships  . Social connections:    Talks on phone: Not on file    Gets together: Not on file    Attends religious service: Not on file    Active member of club or organization: Not on file    Attends meetings of clubs or organizations: Not on file    Relationship status: Not on file  Other Topics Concern  . Not on file  Social History Narrative  . Not on file    Allergies:  Allergies  Allergen Reactions  . Sulfonamide Derivatives Anaphylaxis  . Aspirin Nausea And Vomiting  . Penicillins Hives  . Latex Rash    Metabolic Disorder Labs: Lab Results  Component Value Date   HGBA1C 6.0 (H) 12/07/2014   MPG 126 12/07/2014   MPG 289 (H) 01/02/2010   No results found for: PROLACTIN Lab Results  Component Value Date   CHOL (H) 01/02/2010    257        ATP III CLASSIFICATION:  <200     mg/dL   Desirable  161-096200-239  mg/dL   Borderline High  >=045>=240    mg/dL   High          TRIG 409288 (H) 12/20/2014   HDL 36 (L) 01/02/2010   CHOLHDL 7.1 01/02/2010   VLDL 68 (H) 01/02/2010   LDLCALC (H) 01/02/2010    153        Total Cholesterol/HDL:CHD Risk Coronary Heart Disease Risk Table                     Men   Women  1/2 Average Risk   3.4   3.3  Average Risk       5.0   4.4  2 X Average Risk   9.6   7.1  3 X Average Risk  23.4   11.0        Use the calculated Patient Ratio above and the CHD Risk Table to determine the patient's CHD Risk.        ATP III CLASSIFICATION (LDL):  <100     mg/dL   Optimal  811-914100-129  mg/dL   Near or Above  Optimal  130-159  mg/dL   Borderline  086-578  mg/dL   High  >469     mg/dL   Very High   Lab Results  Component Value Date   TSH 0.848 01/02/2010   TSH 0.848 01/02/2010    Therapeutic Level Labs: No results found for: LITHIUM No results found for: VALPROATE No components found for:  CBMZ  Current Medications: Current Outpatient Medications  Medication Sig Dispense Refill  . acetaminophen (TYLENOL) 500 MG tablet Take 1,000 mg by mouth every 6 (six) hours as needed for mild pain.     Marland Kitchen albuterol (PROVENTIL HFA;VENTOLIN HFA) 108 (90 BASE) MCG/ACT inhaler Inhale 2 puffs into the lungs every 6 (six) hours as needed for wheezing or shortness of breath.    Marland Kitchen aspirin EC 81 MG tablet Take 81 mg by mouth every other day.    Marland Kitchen atorvastatin (LIPITOR) 10 MG tablet Take 10 mg by mouth daily.    . Brexpiprazole (REXULTI) 1 MG TABS Take 1 tablet (1 mg total) by mouth daily. 30 tablet 2  . buPROPion (WELLBUTRIN XL) 150 MG 24 hr tablet Take 1 tablet (150 mg total) by mouth daily. 30 tablet 2  . Choline Fenofibrate (TRILIPIX) 135 MG capsule Take 135 mg by mouth daily.      . cilostazol (PLETAL) 100 MG tablet Take 100 mg by mouth 2 (two) times daily.    . clopidogrel (PLAVIX) 75 MG tablet Take 75 mg by mouth daily.      . cyclobenzaprine (FLEXERIL) 5 MG tablet Take 5 mg by mouth at bedtime.    . furosemide (LASIX) 20 MG tablet Take 20 mg by mouth 2 (two) times daily.    Marland Kitchen HYDROcodone-acetaminophen (NORCO) 10-325 MG tablet TAKE 1 TABLET BY MOUTH TWICE A DAY AS NEEDED FOR SEVERE PAIN  0  . insulin glargine (LANTUS) 100 UNIT/ML injection Inject 110 Units into the skin at bedtime.     . lidocaine (LIDODERM) 5 % Place 1 patch onto the skin daily. Remove & Discard patch within 12 hours or as directed by MD 30 patch 0  . lubiprostone (AMITIZA) 8 MCG capsule Take 8 mcg by mouth 2 (two) times daily with a meal.    . methocarbamol (ROBAXIN) 500 MG tablet Take 1 tablet (500 mg total) by mouth every 8  (eight) hours as needed for muscle spasms. 20 tablet 0  . metoprolol tartrate (LOPRESSOR) 50 MG tablet Take 50 mg by mouth 2 (two) times daily.    . nitroGLYCERIN (NITROSTAT) 0.4 MG SL tablet PLACE 1 TONGUE UNDER TONGUE EVERY 5 MINUTES IF NEEDED FOR CHEST PAIN  0  . NOVOLOG 100 UNIT/ML injection   0  . Olmesartan-Amlodipine-HCTZ (TRIBENZOR) 40-5-25 MG TABS Take 1 tablet by mouth daily.     Marland Kitchen omeprazole (PRILOSEC) 40 MG capsule Take 40 mg by mouth 2 (two) times daily.    Marland Kitchen PARoxetine (PAXIL-CR) 25 MG 24 hr tablet Take 1 tablet (25 mg total) by mouth daily. 30 tablet 2  . potassium chloride SA (K-DUR,KLOR-CON) 20 MEQ tablet Take 20 mEq by mouth daily.    . ranolazine (RANEXA) 1000 MG SR tablet Take 500 mg by mouth 2 (two) times daily.    . sitaGLIPtin (JANUVIA) 100 MG tablet Take 100 mg by mouth daily.      . Vitamin D, Ergocalciferol, (DRISDOL) 50000 UNITS CAPS capsule Take 50,000 Units by mouth every 7 (seven) days.     No current facility-administered medications for this visit.  Musculoskeletal: Strength & Muscle Tone: within normal limits Gait & Station: normal Patient leans: N/A  Psychiatric Specialty Exam: Review of Systems  Constitutional: Negative.   HENT:       Seasonal allergies   Respiratory: Positive for shortness of breath and wheezing. Negative for cough and sputum production.        OSA treated with CPAP  Cardiovascular: Positive for orthopnea and leg swelling. Negative for chest pain, palpitations and claudication.       Sleeps in a recliner  Neurological: Negative.   Psychiatric/Behavioral: Negative for depression (3-4/10 (10 most severe)), hallucinations, memory loss, substance abuse and suicidal ideas. The patient is nervous/anxious and has insomnia (Rexulti helps with sleep).     Blood pressure 136/73, pulse 83, height 5\' 3"  (1.6 m), SpO2 91 %.Body mass index is 63.77 kg/m.  General Appearance: Casual and Tired and morbid obese  Eye Contact:  Good  Speech:   Clear and Coherent and Normal Rate  Volume:  Normal  Mood:  Euthymic  Affect:  Congruent  Thought Process:  Goal Directed  Orientation:  Full (Time, Place, and Person)  Thought Content: Logical and Hallucinations: None   Suicidal Thoughts:  No  Homicidal Thoughts:  No  Memory:  Immediate;   Good Recent;   Good Remote;   Good  Judgement:  Good  Insight:  Good  Psychomotor Activity:  Normal  Concentration:  Concentration: Fair and Attention Span: Fair  Recall:  Good  Fund of Knowledge: Good  Language: Good  Akathisia:  No  Handed:  Right  AIMS (if indicated): not done  Assets:  Communication Skills Desire for Improvement Housing Social Support  ADL's:  Intact  Cognition: WNL  Sleep:  Fair   Screenings:   Assessment and Plan: Major depressive disorder, recurrent.  Rule out bipolar disorder.  Patient currently restricted from her work because she hurt her back and plans to retire. Continue Wellbutrin XL 300 mg daily, Paxil 25 mg daily,  and Rexulti 1 mg HS for depression and mood lability.  Patient is not interested in counseling.  Recommended to call us back if she has any question or any concern.  Follow-up in 3 months   Mariel CraftSHEILA M Anand Tejada, MD 02/03/2018, 11:26 AM

## 2018-05-06 ENCOUNTER — Encounter (HOSPITAL_COMMUNITY): Payer: Self-pay | Admitting: Psychiatry

## 2018-05-06 ENCOUNTER — Ambulatory Visit (INDEPENDENT_AMBULATORY_CARE_PROVIDER_SITE_OTHER): Payer: BLUE CROSS/BLUE SHIELD | Admitting: Psychiatry

## 2018-05-06 VITALS — BP 142/76 | HR 72 | Ht 62.0 in

## 2018-05-06 DIAGNOSIS — F330 Major depressive disorder, recurrent, mild: Secondary | ICD-10-CM | POA: Diagnosis not present

## 2018-05-06 DIAGNOSIS — F411 Generalized anxiety disorder: Secondary | ICD-10-CM | POA: Diagnosis not present

## 2018-05-06 MED ORDER — PAROXETINE HCL ER 25 MG PO TB24: 25.0000 mg | ORAL_TABLET | Freq: Every day | ORAL | 2 refills | Status: DC

## 2018-05-06 MED ORDER — ARIPIPRAZOLE 5 MG PO TABS: 5.0000 mg | ORAL_TABLET | Freq: Every day | ORAL | 2 refills | Status: DC

## 2018-05-06 MED ORDER — BUPROPION HCL ER (XL) 150 MG PO TB24: 150.0000 mg | ORAL_TABLET | Freq: Every day | ORAL | 2 refills | Status: DC

## 2018-05-06 NOTE — Progress Notes (Signed)
[facility] MD/PA/NP OP Progress Note  05/06/2018 10:21 AM Allison [name]  MRN:  [id number]  Chief Complaint:  My insurance does not cover Rexulti.  Discharging me $900 for 30-day supply.  HPI: Patient came for her follow-up appointment.  She is concerned about her coverage of Rexulti as her insurance does not cover and it is getting very expensive.  She is now retired since September 16 and her insurance change.  She feels the medicine working and she has some leftover but she is worried what will happen after that.  In the past she had tried Abilify which worked very well but she could not afford.  Now Abilify is generic and I recommended to try if her insurance approved.  Overall she describes her mood is stable.  She denies any irritability, anger, mania or any psychosis.  She still have a lot of health issues including morbid obesity, chronic pain and tingling in her hand.  She gets easily tired and having shortness of breath when she walked for 5 to 10 minutes.  She uses CPAP for her obstructive sleep apnea.  She admitted sometimes feeling anxious because of her health issues but denies any major panic attack.  However she is happy because she can spend more time with her 10-month-old grandson who lives in [place].  Patient denies any hallucination, paranoia, suicidal thoughts or homicidal thought.  She has no tremors or shakes.  She recently had an MRI which shows degenerative joint disease.  Patient denies drinking or using any illegal substances.  Visit Diagnosis:    ICD-10-CM   1. Major depressive disorder, recurrent episode, mild (HCC) F33.0 PARoxetine (PAXIL-CR) 25 MG 24 hr tablet    buPROPion (WELLBUTRIN XL) 150 MG 24 hr tablet    ARIPiprazole (ABILIFY) 5 MG tablet  2. GAD (generalized anxiety disorder) F41.1 ARIPiprazole (ABILIFY) 5 MG tablet    Past Psychiatric History: Reviewed. Patient denies any history of suicidal attempt or any inpatient psychiatric treatment. She had history of mood  swing, anger, irritability. She seeing psychiatrist in this office since 2003. In the past she tried Risperdal but after feeling better it was discontinued.  We tried Lamictal but it caused itching.  She is unable to afford Abilify  Past Medical History:  Past Medical History:  Diagnosis Date  . Bipolar disorder (HCC)   . Dysphasia   . Hemorrhoids   . History of MRSA infection   . Hyperlipidemia   . Hypokalemia   . Hypotension   . Major depressive disorder   . Migraine headache   . Non-insulin dependent diabetes mellitus   . Non-ST elevated myocardial infarction (non-STEMI) [facility])     Past Surgical History:  Procedure Laterality Date  . CARDIAC CATHETERIZATION  01/03/2010  . ENDOMETRIAL ABLATION  2009   Laser  . ESOPHAGEAL DILATION  2006  . KNEE ARTHROSCOPY  1996   Right  . LAPAROTOMY N/A 12/10/2014   Procedure: EXPLORATORY LAPAROTOMY, APPENDECTOMY, SMALL BOWEL RESECTION, WOUND VAC PLACEMENT;  Surgeon: Emelia Loron, MD;  Location: [facility] OR;  Service: General;  Laterality: N/A;  . LAPAROTOMY N/A 12/12/2014   Procedure: EXPLORATORY LAPAROTOMY, RENASTAMOSISED SMALL BOWEL;  Surgeon: Axel Filler, MD;  Location: [facility] OR;  Service: General;  Laterality: N/A;  . VACUUM ASSISTED CLOSURE CHANGE N/A 12/14/2014   Procedure: ABDOMINAL VACUUM CHANGE;  Surgeon: Chevis Pretty III, MD;  Location: [facility] OR;  Service: General;  Laterality: N/A;  . VACUUM ASSISTED CLOSURE CHANGE N/A 12/16/2014   Procedure: VACUUM CHANGE  AND WOUND  CLOSURE ;  Surgeon: Chevis Pretty III, MD;  Location: [facility] OR;  Service: General;  Laterality: N/A;    Family Psychiatric History: Reviewed.  Family History:  Family History  Problem Relation Age of Onset  . Bipolar disorder Sister     Social History:  Social History   Socioeconomic History  . Marital status: Married    Spouse name: Not on file  . Number of children: Not on file  . Years of education: Not on file  . Highest education level: Not on file  Occupational  History  . Not on file  Social Needs  . Financial resource strain: Not on file  . Food insecurity:    Worry: Not on file    Inability: Not on file  . Transportation needs:    Medical: Not on file    Non-medical: Not on file  Tobacco Use  . Smoking status: Former Smoker    Last attempt to quit: 12/23/2009    Years since quitting: 8.3  . Smokeless tobacco: Never Used  Substance and Sexual Activity  . Alcohol use: No    Alcohol/week: 0.0 standard drinks  . Drug use: No  . Sexual activity: Not on file  Lifestyle  . Physical activity:    Days per week: Not on file    Minutes per session: Not on file  . Stress: Not on file  Relationships  . Social connections:    Talks on phone: Not on file    Gets together: Not on file    Attends religious service: Not on file    Active member of club or organization: Not on file    Attends meetings of clubs or organizations: Not on file    Relationship status: Not on file  Other Topics Concern  . Not on file  Social History Narrative  . Not on file    Allergies:  Allergies  Allergen Reactions  . Sulfonamide Derivatives Anaphylaxis  . Aspirin Nausea And Vomiting  . Penicillins Hives  . Latex Rash    Metabolic Disorder Labs: Lab Results  Component Value Date   HGBA1C 6.0 (H) 12/07/2014   MPG 126 12/07/2014   MPG 289 (H) 01/02/2010   No results found for: PROLACTIN Lab Results  Component Value Date   CHOL (H) 01/02/2010    257        ATP III CLASSIFICATION:  <200     mg/dL   Desirable  161-096  mg/dL   Borderline High  >=045    mg/dL   High          TRIG 409 (H) 12/20/2014   HDL 36 (L) 01/02/2010   CHOLHDL 7.1 01/02/2010   VLDL 68 (H) 01/02/2010   LDLCALC (H) 01/02/2010    153        Total Cholesterol/HDL:CHD Risk Coronary Heart Disease Risk Table                     Men   Women  1/2 Average Risk   3.4   3.3  Average Risk       5.0   4.4  2 X Average Risk   9.6   7.1  3 X Average Risk  23.4   11.0        Use the  calculated Patient Ratio above and the CHD Risk Table to determine the patient's CHD Risk.        ATP III CLASSIFICATION (LDL):  <100     mg/dL  Optimal  100-129  mg/dL   Near or Above                    Optimal  130-159  mg/dL   Borderline  161-096  mg/dL   High  >045     mg/dL   Very High   Lab Results  Component Value Date   TSH 0.848 01/02/2010   TSH 0.848 01/02/2010    Therapeutic Level Labs: No results found for: LITHIUM No results found for: VALPROATE No components found for:  CBMZ  Current Medications: Current Outpatient Medications  Medication Sig Dispense Refill  . acetaminophen (TYLENOL) 500 MG tablet Take 1,000 mg by mouth every 6 (six) hours as needed for mild pain.     Marland Kitchen albuterol (PROVENTIL HFA;VENTOLIN HFA) 108 (90 BASE) MCG/ACT inhaler Inhale 2 puffs into the lungs every 6 (six) hours as needed for wheezing or shortness of breath.    Marland Kitchen aspirin EC 81 MG tablet Take 81 mg by mouth every other day.    Marland Kitchen atorvastatin (LIPITOR) 10 MG tablet Take 10 mg by mouth daily.    . Brexpiprazole (REXULTI) 1 MG TABS Take 1 tablet (1 mg total) by mouth daily. 30 tablet 2  . buPROPion (WELLBUTRIN XL) 150 MG 24 hr tablet Take 1 tablet (150 mg total) by mouth daily. 30 tablet 2  . Choline Fenofibrate (TRILIPIX) 135 MG capsule Take 135 mg by mouth daily.      . cilostazol (PLETAL) 100 MG tablet Take 100 mg by mouth 2 (two) times daily.    . clopidogrel (PLAVIX) 75 MG tablet Take 75 mg by mouth daily.      . cyclobenzaprine (FLEXERIL) 5 MG tablet Take 5 mg by mouth at bedtime.    . furosemide (LASIX) 20 MG tablet Take 20 mg by mouth 2 (two) times daily.    Marland Kitchen HYDROcodone-acetaminophen (NORCO) 10-325 MG tablet TAKE 1 TABLET BY MOUTH TWICE A DAY AS NEEDED FOR SEVERE PAIN  0  . insulin glargine (LANTUS) 100 UNIT/ML injection Inject 110 Units into the skin at bedtime.     . lidocaine (LIDODERM) 5 % Place 1 patch onto the skin daily. Remove & Discard patch within 12 hours or as directed  by MD 30 patch 0  . lubiprostone (AMITIZA) 8 MCG capsule Take 8 mcg by mouth 2 (two) times daily with a meal.    . methocarbamol (ROBAXIN) 500 MG tablet Take 1 tablet (500 mg total) by mouth every 8 (eight) hours as needed for muscle spasms. 20 tablet 0  . metoprolol tartrate (LOPRESSOR) 50 MG tablet Take 50 mg by mouth 2 (two) times daily.    . nitroGLYCERIN (NITROSTAT) 0.4 MG SL tablet PLACE 1 TONGUE UNDER TONGUE EVERY 5 MINUTES IF NEEDED FOR CHEST PAIN  0  . NOVOLOG 100 UNIT/ML injection   0  . Olmesartan-Amlodipine-HCTZ (TRIBENZOR) 40-5-25 MG TABS Take 1 tablet by mouth daily.     Marland Kitchen omeprazole (PRILOSEC) 40 MG capsule Take 40 mg by mouth 2 (two) times daily.    Marland Kitchen PARoxetine (PAXIL-CR) 25 MG 24 hr tablet Take 1 tablet (25 mg total) by mouth daily. 30 tablet 2  . potassium chloride SA (K-DUR,KLOR-CON) 20 MEQ tablet Take 20 mEq by mouth daily.    . ranolazine (RANEXA) 1000 MG SR tablet Take 500 mg by mouth 2 (two) times daily.    . sitaGLIPtin (JANUVIA) 100 MG tablet Take 100 mg by mouth daily.      Marland Kitchen  Vitamin D, Ergocalciferol, (DRISDOL) 50000 UNITS CAPS capsule Take 50,000 Units by mouth every 7 (seven) days.     No current facility-administered medications for this visit.      Musculoskeletal: Strength & Muscle Tone: within normal limits Gait & Station: need cane to help balance Patient leans: Right  Psychiatric Specialty Exam: Review of Systems  Constitutional: Negative for weight loss.  HENT: Negative.   Respiratory: Positive for shortness of breath.   Musculoskeletal: Positive for back pain and joint pain.  Skin: Negative.   Neurological: Positive for tingling.    Blood pressure (!) 142/76, pulse 72, height 5\' 2"  (1.575 m).There is no height or weight on file to calculate BMI.  General Appearance: Casual, Fairly Groomed and morbid obese and tired  Eye Contact:  Good  Speech:  Slow  Volume:  Normal  Mood:  Euthymic  Affect:  Congruent  Thought Process:  Goal Directed   Orientation:  Full (Time, Place, and Person)  Thought Content: Logical   Suicidal Thoughts:  No  Homicidal Thoughts:  No  Memory:  Immediate;   Good Recent;   Good Remote;   Good  Judgement:  Good  Insight:  Good  Psychomotor Activity:  Decreased  Concentration:  Concentration: Fair and Attention Span: Fair  Recall:  Fair  Fund of Knowledge: Good  Language: Good  Akathisia:  No  Handed:  Right  AIMS (if indicated): not done  Assets:  Communication Skills Desire for Improvement Housing Resilience  ADL's:  Intact  Cognition: WNL  Sleep:  Fair   Screenings:   Assessment and Plan: Major depressive disorder, recurrent.  Generalized anxiety disorder.  I discussed medication efficacy and side effects.  She like REXULTI but concern about coverage issues.  I recommended to try again Abilify which helped her in the past but she could not afford at that time because it was brand but now Abilify is generic.  I also provided good Rx coupon to help the cost.  In the meantime she will continue Rexulti 1 mg until she find a better alternative.  I will provide Rexulti 1 mg samples until then.  Continue Paxil 25 mg daily and Wellbutrin XL 300 mg daily.  Recommended to continue CPAP to help insomnia.  Patient refused to have weight to check and she told that she is trying to lose weight.  I recommended to call us back if she has any question or any concern.  Abilify 5 mg tablet to take half to 1 tablet daily is given with good Rx coupon.  Recommended to call back if she still have trouble getting prescription.  Follow-up in 3 months.  Patient is not interested in therapy.     Cleotis Nipper, MD 05/06/2018, 10:21 AM

## 2018-08-06 ENCOUNTER — Encounter (HOSPITAL_COMMUNITY): Payer: Self-pay | Admitting: Psychiatry

## 2018-08-06 ENCOUNTER — Ambulatory Visit (INDEPENDENT_AMBULATORY_CARE_PROVIDER_SITE_OTHER): Payer: BLUE CROSS/BLUE SHIELD | Admitting: Psychiatry

## 2018-08-06 VITALS — BP 134/67 | HR 80 | Ht 63.0 in

## 2018-08-06 DIAGNOSIS — F411 Generalized anxiety disorder: Secondary | ICD-10-CM | POA: Diagnosis not present

## 2018-08-06 DIAGNOSIS — F330 Major depressive disorder, recurrent, mild: Secondary | ICD-10-CM | POA: Diagnosis not present

## 2018-08-06 MED ORDER — BREXPIPRAZOLE 1 MG PO TABS: 1.0000 mg | ORAL_TABLET | Freq: Every day | ORAL | 0 refills | Status: DC

## 2018-08-06 MED ORDER — BUPROPION HCL ER (XL) 150 MG PO TB24: 150.0000 mg | ORAL_TABLET | Freq: Every day | ORAL | 2 refills | Status: DC

## 2018-08-06 MED ORDER — PAROXETINE HCL 30 MG PO TABS: 30.0000 mg | ORAL_TABLET | Freq: Every day | ORAL | 2 refills | Status: DC

## 2018-08-06 NOTE — Progress Notes (Signed)
[facility] MD/PA/NP OP Progress Note  08/06/2018 2:05 PM [name]  MRN:  [id number]  Chief Complaint: I cannot afford my medication.  I am using Rexulti samples but sometimes I take half tablet so I do not ran out.  HPI: Allison [name] came for her appointment.  She is concerned about her insurance coverage.  She cannot afford Abilify and now she is back on Rexulti samples.  She was prescribed 1 mg but sometimes she takes only half tablet so that she do not run out.  She admitted continues to have residual irritability and anger but denies any paranoia, hallucination or any suicidal thoughts.  Patient has multiple health issues and morbid obesity.  She has chronic pain, tingling and had difficulty walking.  She uses cane to help her balance.  That if she takes the medication as prescribed it works very well.  She uses CPAP.  She gets easily tired and sometimes having shortness of breath when she walks 5 to 10 minutes.  However she denies any crying spells or any aggressive behavior.  She has no rash or itching.  She preferred to change her medication to generic so she can afford.  She had a good Christmas with her family including HER-57-year-old grandson.  Patient denies drinking or using any illegal substances.  Visit Diagnosis:    ICD-10-CM   1. GAD (generalized anxiety disorder) F41.1 PARoxetine (PAXIL) 30 MG tablet  2. Major depressive disorder, recurrent episode, mild (HCC) F33.0 PARoxetine (PAXIL) 30 MG tablet    buPROPion (WELLBUTRIN XL) 150 MG 24 hr tablet    Brexpiprazole (REXULTI) 1 MG TABS    Past Psychiatric History: Reviewed. No H/O suicidal attempt or any inpatient psychiatric treatment. H/O mood swing, anger, irritability. Saw psychiatrist since 2003. Tried Risperdal but stopped after feeling better. We tried Lamictal but it caused itching. She is unable to afford Abilify.  Past Medical History:  Past Medical History:  Diagnosis Date  . Bipolar disorder ([place])   . Dysphasia   .  Hemorrhoids   . History of MRSA infection   . Hyperlipidemia   . Hypokalemia   . Hypotension   . Major depressive disorder   . Migraine headache   . Non-insulin dependent diabetes mellitus   . Non-ST elevated myocardial infarction (non-STEMI) [facility])     Past Surgical History:  Procedure Laterality Date  . CARDIAC CATHETERIZATION  01/03/2010  . ENDOMETRIAL ABLATION  2009   Laser  . ESOPHAGEAL DILATION  2006  . KNEE ARTHROSCOPY  1996   Right  . LAPAROTOMY N/A 12/10/2014   Procedure: EXPLORATORY LAPAROTOMY, APPENDECTOMY, SMALL BOWEL RESECTION, WOUND VAC PLACEMENT;  Surgeon: Rolm Bookbinder, MD;  Location: [place];  Service: General;  Laterality: N/A;  . LAPAROTOMY N/A 12/12/2014   Procedure: EXPLORATORY LAPAROTOMY, RENASTAMOSISED SMALL BOWEL;  Surgeon: Ralene Ok, MD;  Location: [place];  Service: General;  Laterality: N/A;  . VACUUM ASSISTED CLOSURE CHANGE N/A 12/14/2014   Procedure: ABDOMINAL VACUUM CHANGE;  Surgeon: Autumn Messing III, MD;  Location: [place];  Service: General;  Laterality: N/A;  . VACUUM ASSISTED CLOSURE CHANGE N/A 12/16/2014   Procedure: VACUUM CHANGE  AND WOUND CLOSURE ;  Surgeon: Autumn Messing III, MD;  Location: [place];  Service: General;  Laterality: N/A;    Family Psychiatric History: Viewed.  Family History:  Family History  Problem Relation Age of Onset  . Bipolar disorder Sister     Social History:  Social History   Socioeconomic History  . Marital status: Married  Spouse name: Not on file  . Number of children: Not on file  . Years of education: Not on file  . Highest education level: Not on file  Occupational History  . Not on file  Social Needs  . Financial resource strain: Not on file  . Food insecurity:    Worry: Not on file    Inability: Not on file  . Transportation needs:    Medical: Not on file    Non-medical: Not on file  Tobacco Use  . Smoking status: Former Smoker    Last attempt to quit: 12/23/2009    Years since quitting: 8.6  .  Smokeless tobacco: Never Used  Substance and Sexual Activity  . Alcohol use: No    Alcohol/week: 0.0 standard drinks  . Drug use: No  . Sexual activity: Not on file  Lifestyle  . Physical activity:    Days per week: Not on file    Minutes per session: Not on file  . Stress: Not on file  Relationships  . Social connections:    Talks on phone: Not on file    Gets together: Not on file    Attends religious service: Not on file    Active member of club or organization: Not on file    Attends meetings of clubs or organizations: Not on file    Relationship status: Not on file  Other Topics Concern  . Not on file  Social History Narrative  . Not on file    Allergies:  Allergies  Allergen Reactions  . Sulfonamide Derivatives Anaphylaxis  . Aspirin Nausea And Vomiting  . Penicillins Hives  . Latex Rash    Metabolic Disorder Labs: Lab Results  Component Value Date   HGBA1C 6.0 (H) 12/07/2014   MPG 126 12/07/2014   MPG 289 (H) 01/02/2010   No results found for: PROLACTIN Lab Results  Component Value Date   CHOL (H) 01/02/2010    257        ATP III CLASSIFICATION:  <200     mg/dL   Desirable  200-239  mg/dL   Borderline High  >=240    mg/dL   High          TRIG 288 (H) 12/20/2014   HDL 36 (L) 01/02/2010   CHOLHDL 7.1 01/02/2010   VLDL 68 (H) 01/02/2010   LDLCALC (H) 01/02/2010    153        Total Cholesterol/HDL:CHD Risk Coronary Heart Disease Risk Table                     Men   Women  1/2 Average Risk   3.4   3.3  Average Risk       5.0   4.4  2 X Average Risk   9.6   7.1  3 X Average Risk  23.4   11.0        Use the calculated Patient Ratio above and the CHD Risk Table to determine the patient's CHD Risk.        ATP III CLASSIFICATION (LDL):  <100     mg/dL   Optimal  100-129  mg/dL   Near or Above                    Optimal  130-159  mg/dL   Borderline  160-189  mg/dL   High  >190     mg/dL   Very High   Lab Results  Component Value  Date   TSH  0.848 01/02/2010   TSH 0.848 01/02/2010    Therapeutic Level Labs: No results found for: LITHIUM No results found for: VALPROATE No components found for:  CBMZ  Current Medications: Current Outpatient Medications  Medication Sig Dispense Refill  . acetaminophen (TYLENOL) 500 MG tablet Take 1,000 mg by mouth every 6 (six) hours as needed for mild pain.     Marland Kitchen albuterol (PROVENTIL HFA;VENTOLIN HFA) 108 (90 BASE) MCG/ACT inhaler Inhale 2 puffs into the lungs every 6 (six) hours as needed for wheezing or shortness of breath.    . ARIPiprazole (ABILIFY) 5 MG tablet Take 1 tablet (5 mg total) by mouth daily. 30 tablet 2  . aspirin EC 81 MG tablet Take 81 mg by mouth every other day.    Marland Kitchen atorvastatin (LIPITOR) 10 MG tablet Take 10 mg by mouth daily.    . Brexpiprazole (REXULTI) 1 MG TABS Take 1 tablet (1 mg total) by mouth daily. 30 tablet 2  . buPROPion (WELLBUTRIN XL) 150 MG 24 hr tablet Take 1 tablet (150 mg total) by mouth daily. 30 tablet 2  . Choline Fenofibrate (TRILIPIX) 135 MG capsule Take 135 mg by mouth daily.      . cilostazol (PLETAL) 100 MG tablet Take 100 mg by mouth 2 (two) times daily.    . clopidogrel (PLAVIX) 75 MG tablet Take 75 mg by mouth daily.      . cyclobenzaprine (FLEXERIL) 5 MG tablet Take 5 mg by mouth at bedtime.    . furosemide (LASIX) 20 MG tablet Take 20 mg by mouth 2 (two) times daily.    Marland Kitchen HYDROcodone-acetaminophen (NORCO) 10-325 MG tablet TAKE 1 TABLET BY MOUTH TWICE A DAY AS NEEDED FOR SEVERE PAIN  0  . insulin glargine (LANTUS) 100 UNIT/ML injection Inject 110 Units into the skin at bedtime.     . isosorbide mononitrate (IMDUR) 60 MG 24 hr tablet Take 60 mg by mouth daily.  3  . lidocaine (LIDODERM) 5 % Place 1 patch onto the skin daily. Remove & Discard patch within 12 hours or as directed by MD 30 patch 0  . lubiprostone (AMITIZA) 8 MCG capsule Take 8 mcg by mouth 2 (two) times daily with a meal.    . meloxicam (MOBIC) 15 MG tablet TAKE 1 TABLET BY MOUTH  IN THE MORNING AS NEEDED WITH FOOD  1  . methocarbamol (ROBAXIN) 500 MG tablet Take 1 tablet (500 mg total) by mouth every 8 (eight) hours as needed for muscle spasms. 20 tablet 0  . metoprolol tartrate (LOPRESSOR) 50 MG tablet Take 50 mg by mouth 2 (two) times daily.    . nitroGLYCERIN (NITROSTAT) 0.4 MG SL tablet PLACE 1 TONGUE UNDER TONGUE EVERY 5 MINUTES IF NEEDED FOR CHEST PAIN  0  . NOVOLOG 100 UNIT/ML injection   0  . Olmesartan-Amlodipine-HCTZ (TRIBENZOR) 40-5-25 MG TABS Take 1 tablet by mouth daily.     Marland Kitchen omeprazole (PRILOSEC) 40 MG capsule Take 40 mg by mouth 2 (two) times daily.    Marland Kitchen PARoxetine (PAXIL-CR) 25 MG 24 hr tablet Take 1 tablet (25 mg total) by mouth daily. 30 tablet 2  . potassium chloride SA (K-DUR,KLOR-CON) 20 MEQ tablet Take 20 mEq by mouth daily.    . ranolazine (RANEXA) 1000 MG SR tablet Take 500 mg by mouth 2 (two) times daily.    . sitaGLIPtin (JANUVIA) 100 MG tablet Take 100 mg by mouth daily.      . Vitamin D, Ergocalciferol, (  DRISDOL) 50000 UNITS CAPS capsule Take 50,000 Units by mouth every 7 (seven) days.     No current facility-administered medications for this visit.      Musculoskeletal: Strength & Muscle Tone: within normal limits Gait & Station: need cain to help balance Patient leans: Right  Psychiatric Specialty Exam: Review of Systems  Constitutional: Positive for malaise/fatigue.  Cardiovascular:       Chronic shortness of breath after walking for few steps.  Neurological: Positive for tingling.    Blood pressure 134/67, pulse 80, height 5' 3"  (1.6 m).Body mass index is 63.77 kg/m.  General Appearance: Casual, Fairly Groomed and morbid obese  Eye Contact:  Fair  Speech:  Slow  Volume:  Normal  Mood:  Euthymic  Affect:  Congruent  Thought Process:  Descriptions of Associations: Intact  Orientation:  Full (Time, Place, and Person)  Thought Content: Rumination   Suicidal Thoughts:  No  Homicidal Thoughts:  No  Memory:  Immediate;    Good Recent;   Good Remote;   Good  Judgement:  Good  Insight:  Fair  Psychomotor Activity:  Decreased  Concentration:  Concentration: Fair and Attention Span: Fair  Recall:  Good  Fund of Knowledge: Good  Language: Good  Akathisia:  No  Handed:  Right  AIMS (if indicated): not done  Assets:  Communication Skills Desire for Improvement Housing  ADL's:  Intact  Cognition: WNL  Sleep:  Fair   Screenings:   Assessment and Plan: Major depressive disorder, recurrent.  Generalized anxiety disorder.  Discussed risk of noncompliance with medication.  We will provide Rexulti 1 mg samples and I recommended to take 1 tablet every day to avoid relapse.  I would also change her Paxil CR from 25 mg to regular 30 mg daily so she can afford it.  Continue Wellbutrin XL 300 mg daily.  Patient has no side effects from the medication.  I recommended therapy but she refused.  Discussed medication side effects and benefits.  Recommended to call us back if she has any question or any concern.  Follow-up in 3 months.     Kathlee Nations, MD 08/06/2018, 2:05 PM

## 2018-11-04 ENCOUNTER — Ambulatory Visit (HOSPITAL_COMMUNITY): Payer: BLUE CROSS/BLUE SHIELD | Admitting: Psychiatry

## 2018-11-06 ENCOUNTER — Other Ambulatory Visit (HOSPITAL_COMMUNITY): Payer: Self-pay | Admitting: Psychiatry

## 2018-11-06 DIAGNOSIS — F411 Generalized anxiety disorder: Secondary | ICD-10-CM

## 2018-11-06 DIAGNOSIS — F330 Major depressive disorder, recurrent, mild: Secondary | ICD-10-CM

## 2018-12-01 ENCOUNTER — Other Ambulatory Visit (HOSPITAL_COMMUNITY): Payer: Self-pay

## 2018-12-01 DIAGNOSIS — F411 Generalized anxiety disorder: Secondary | ICD-10-CM

## 2018-12-01 DIAGNOSIS — F330 Major depressive disorder, recurrent, mild: Secondary | ICD-10-CM

## 2018-12-02 ENCOUNTER — Ambulatory Visit (INDEPENDENT_AMBULATORY_CARE_PROVIDER_SITE_OTHER): Payer: BC Managed Care – PPO | Admitting: Psychiatry

## 2018-12-02 ENCOUNTER — Encounter (HOSPITAL_COMMUNITY): Payer: Self-pay | Admitting: Psychiatry

## 2018-12-02 ENCOUNTER — Other Ambulatory Visit: Payer: Self-pay

## 2018-12-02 DIAGNOSIS — F411 Generalized anxiety disorder: Secondary | ICD-10-CM | POA: Diagnosis not present

## 2018-12-02 DIAGNOSIS — F330 Major depressive disorder, recurrent, mild: Secondary | ICD-10-CM

## 2018-12-02 MED ORDER — PAROXETINE HCL 30 MG PO TABS: 30.0000 mg | ORAL_TABLET | Freq: Every day | ORAL | 1 refills | Status: DC

## 2018-12-02 MED ORDER — RISPERIDONE 1 MG PO TABS: ORAL_TABLET | ORAL | 1 refills | Status: DC

## 2018-12-02 MED ORDER — BUPROPION HCL ER (XL) 150 MG PO TB24: 150.0000 mg | ORAL_TABLET | Freq: Every day | ORAL | 1 refills | Status: DC

## 2018-12-02 NOTE — Progress Notes (Signed)
Virtual Visit via Telephone Note  I connected with Allison SallesRobin M [name] on 12/02/18 at 10:40 AM EDT by telephone and verified that I am speaking with the correct person using two identifiers.   I discussed the limitations, risks, security and privacy concerns of performing an evaluation and management service by telephone and the availability of in person appointments. I also discussed with the patient that there may be a patient responsible charge related to this service. The patient expressed understanding and agreed to proceed.   History of Present Illness: Patient was evaluated by phone session.  She is upset on her insurance because she cannot get insulin and now she is trying to get through pharmaceutical company to get her insulin.  She also not able to afford Rexulti which we were giving samples but due to COVID-19 she cannot come to the office.  In the past we have tried Abilify with good response but her insurance did not approve Abilify either.  Patient admitted irritability, mood swings, depression, poor sleep, racing thoughts and getting easily frustrated and upset.  She also endorsed having crying spells and getting easily tired.  She denies any feeling of hopelessness or worthlessness but like to try something to help her depression.  She feels very anxious and nervous around people.  She is not working due to COVID-19 as school is close.  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  She is using CPAP machine.  But she gets easily tired and sometimes having shortness of breath due to being overweight.  She try to walk few times but due to pain and shortness of breath she had stopped.  However she is pleased that her weight is stable.  Patient denies any paranoia, hallucination, suicidal thoughts or homicidal thought.  She lives with her husband who is very supportive and she has 122-year-old grandson.  Patient denies drinking or using any illegal substances.  She  takes Wellbutrin and Paxil.  She reported no tremors, shakes or any EPS.     Past Psychiatric History: Reviewed. No H/O suicidal attempt or any inpatient psychiatric treatment. H/O mood swing, anger, irritability. Saw psychiatrist since 2003. Tried Risperdal but stopped after feeling better. We tried Lamictal but it caused itching.She is unable to afford Abilify.   Psychiatric Specialty Exam: Physical Exam  ROS  There were no vitals taken for this visit.There is no height or weight on file to calculate BMI.  General Appearance: NA  Eye Contact:  NA  Speech:  Clear and Coherent and Pressured  Volume:  Increased  Mood:  Depressed and Irritable  Affect:  NA  Thought Process:  Descriptions of Associations: Intact  Orientation:  Full (Time, Place, and Person)  Thought Content:  Rumination  Suicidal Thoughts:  No  Homicidal Thoughts:  No  Memory:  Immediate;   Good Recent;   Good Remote;   Good  Judgement:  Fair  Insight:  Fair  Psychomotor Activity:  NA  Concentration:  Concentration: Fair and Attention Span: Fair  Recall:  Good  Fund of Knowledge:  Good  Language:  Good  Akathisia:  No  Handed:  Right  AIMS (if indicated):     Assets:  Communication Skills Desire for Improvement Housing Resilience Social Support  ADL's:  Intact  Cognition:  WNL  Sleep:   fair      Assessment and Plan: Major depressive disorder, recurrent.  Generalized anxiety disorder.  Patient is out of Rexulti sample  and cannot afford the prescription.  She also cannot afford Abilify as her insurance does not approve.  She is not happy with her insurance because lately insurance also did not approve the insulin.  However her physician is trying to get insulin through the pharmaceutical company and she will resume insulin shortly.  We discussed resuming Risperdal which she has taken years ago with very good response.  However I explained Risperdal can cause worsening of blood sugar and patient  promised that she will keep watching her blood sugar regularly and if there will be some fluctuation then she will discuss with her primary care physician.  Patient is hoping her insulin issue will be resolved today.  I discussed medication side effect specially EPS and metabolic syndrome from Risperdal.  She like to continue Paxil and Wellbutrin.  We will start Risperdal 1 mg to take half tablet for few days and then full tablet daily.  I recommend to call us back if she has any question, concern if she feels worsening of the symptom.  Continue Paxil 30 mg daily and Wellbutrin XL 150 mg daily.  Follow-up in 2 months.  Discuss safety concern that anytime having active suicidal thoughts or homicidal thought then she need to call 911 or go to local emergency room.  Follow Up Instructions:    I discussed the assessment and treatment plan with the patient. The patient was provided an opportunity to ask questions and all were answered. The patient agreed with the plan and demonstrated an understanding of the instructions.   The patient was advised to call back or seek an in-person evaluation if the symptoms worsen or if the condition fails to improve as anticipated.  I provided 20 minutes of non-face-to-face time during this encounter.   Kathlee Nations, MD

## 2019-01-30 ENCOUNTER — Ambulatory Visit (INDEPENDENT_AMBULATORY_CARE_PROVIDER_SITE_OTHER): Payer: BC Managed Care – PPO | Admitting: Psychiatry

## 2019-01-30 ENCOUNTER — Encounter (HOSPITAL_COMMUNITY): Payer: Self-pay | Admitting: Psychiatry

## 2019-01-30 ENCOUNTER — Other Ambulatory Visit: Payer: Self-pay

## 2019-01-30 DIAGNOSIS — F330 Major depressive disorder, recurrent, mild: Secondary | ICD-10-CM

## 2019-01-30 DIAGNOSIS — F411 Generalized anxiety disorder: Secondary | ICD-10-CM | POA: Diagnosis not present

## 2019-01-30 MED ORDER — BUPROPION HCL ER (XL) 150 MG PO TB24: 150.0000 mg | ORAL_TABLET | Freq: Every day | ORAL | 2 refills | Status: DC

## 2019-01-30 MED ORDER — PAROXETINE HCL 30 MG PO TABS: 30.0000 mg | ORAL_TABLET | Freq: Every day | ORAL | 2 refills | Status: DC

## 2019-01-30 MED ORDER — RISPERIDONE 0.5 MG PO TABS: 0.5000 mg | ORAL_TABLET | Freq: Every day | ORAL | 1 refills | Status: DC

## 2019-01-30 NOTE — Progress Notes (Signed)
Virtual Visit via Telephone Note  I connected with Allison [name] on 01/30/19 at 10:40 AM EDT by telephone and verified that I am speaking with the correct person using two identifiers.   I discussed the limitations, risks, security and privacy concerns of performing an evaluation and management service by telephone and the availability of in person appointments. I also discussed with the patient that there may be a patient responsible charge related to this service. The patient expressed understanding and agreed to proceed.   History of Present Illness: Patient was evaluated by phone session.  On her last visit she is been out of Rexulti due to insurance reason and having a lot of irritability, mood swings, agitation, racing thoughts poor sleep and frustration.  We resume Risperdal which she used to take in the past.  However she is only taking twice a week since she feels it is working very well and in the morning sometimes she feels groggy.  Since taking the Risperdal twice a week she noticed much improvement in her mood she is not as agitated irritable but there are days when she feels her symptoms are coming back.  She is also relaxed because now she is retired from school system.  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  Her insurance is also changed.  She is using CPAP and also getting her insulin which she has not done in the past regularly.  She is now focusing on her weight and thinking to start exercise on a regular basis.  Her husband is very supportive.  She denies any paranoia, hallucination, suicidal thoughts or homicidal thought.  She has no tremors shakes or any EPS.  She denies drinking or using any illegal substances.  Past Psychiatric History:Reviewed. No H/Osuicidal attempt or any inpatient psychiatric treatment. H/Omood swing, anger, irritability.Sawpsychiatrist since 2003. TriedRisperdal butstoppedafter feeling better.We tried Lamictal but it  caused itching.She is unable to afford Abilify.   Psychiatric Specialty Exam: Physical Exam  ROS  There were no vitals taken for this visit.There is no height or weight on file to calculate BMI.  General Appearance: NA  Eye Contact:  NA  Speech:  Clear and Coherent and Slow  Volume:  Normal  Mood:  Anxious  Affect:  NA  Thought Process:  Descriptions of Associations: Intact  Orientation:  Full (Time, Place, and Person)  Thought Content:  WDL  Suicidal Thoughts:  No  Homicidal Thoughts:  No  Memory:  Immediate;   Good Recent;   Fair Remote;   Fair  Judgement:  Fair  Insight:  Present  Psychomotor Activity:  NA  Concentration:  Concentration: Fair and Attention Span: Fair  Recall:  Good  Fund of Knowledge:  Good  Language:  Good  Akathisia:  No  Handed:  Right  AIMS (if indicated):     Assets:  Communication Skills Desire for Improvement Housing Resilience Social Support Transportation  ADL's:  Intact  Cognition:  WNL  Sleep:   improved      Assessment and Plan: Major depressive disorder, recurrent.  Generalized anxiety disorder.  I recommend to take respite all reduced to 0.5 mg but take it every night so it stays in the system and avoid side effects especially sedation in the morning.  She agreed with the plan.  She is pleased that her insurance is now covering her medication and she is taking the insulin.  She has no tremors shakes.  I reminded that she should check  her blood sugar on a regular basis.  She feels her Paxil and Wellbutrin working very well.  Continue Paxil 30 mg daily and Wellbutrin XL 150 mg daily.  Discussed medication side effects and benefits.  Recommended to call us back if she is any question or any concern.  Follow-up in 3 months.  Follow Up Instructions:    I discussed the assessment and treatment plan with the patient. The patient was provided an opportunity to ask questions and all were answered. The patient agreed with the plan and  demonstrated an understanding of the instructions.   The patient was advised to call back or seek an in-person evaluation if the symptoms worsen or if the condition fails to improve as anticipated.  I provided 20 minutes of non-face-to-face time during this encounter.   Kathlee Nations, MD

## 2019-05-01 ENCOUNTER — Other Ambulatory Visit: Payer: Self-pay | Admitting: Family Medicine

## 2019-05-01 DIAGNOSIS — Z1231 Encounter for screening mammogram for malignant neoplasm of breast: Secondary | ICD-10-CM

## 2019-05-04 ENCOUNTER — Ambulatory Visit (INDEPENDENT_AMBULATORY_CARE_PROVIDER_SITE_OTHER): Payer: BC Managed Care – PPO | Admitting: Psychiatry

## 2019-05-04 ENCOUNTER — Other Ambulatory Visit: Payer: Self-pay

## 2019-05-04 ENCOUNTER — Encounter (HOSPITAL_COMMUNITY): Payer: Self-pay | Admitting: Psychiatry

## 2019-05-04 DIAGNOSIS — F330 Major depressive disorder, recurrent, mild: Secondary | ICD-10-CM

## 2019-05-04 DIAGNOSIS — F411 Generalized anxiety disorder: Secondary | ICD-10-CM | POA: Diagnosis not present

## 2019-05-04 MED ORDER — PAROXETINE HCL 30 MG PO TABS: 30.0000 mg | ORAL_TABLET | Freq: Every day | ORAL | 2 refills | Status: DC

## 2019-05-04 MED ORDER — BUPROPION HCL ER (XL) 150 MG PO TB24: 150.0000 mg | ORAL_TABLET | Freq: Every day | ORAL | 2 refills | Status: DC

## 2019-05-04 MED ORDER — RISPERIDONE 0.5 MG PO TABS: 0.5000 mg | ORAL_TABLET | Freq: Every day | ORAL | 2 refills | Status: DC

## 2019-05-04 NOTE — Progress Notes (Signed)
Virtual Visit via Telephone Note  I connected with Allison [name] on 05/04/19 at  2:20 PM EST by telephone and verified that I am speaking with the correct person using two identifiers.   I discussed the limitations, risks, security and privacy concerns of performing an evaluation and management service by telephone and the availability of in person appointments. I also discussed with the patient that there may be a patient responsible charge related to this service. The patient expressed understanding and agreed to proceed.   History of Present Illness: Patient was evaluated by phone session.  She is taking Risperdal 0.5 mg at bedtime and she does not feel as sedated in the morning.  Though she sometimes gets racing thoughts but she does not want to add more medication.  She admitted noncompliant with insulin for a while as she is rationing because her insurance does not approve the insulin.  Patient is now retired and her new insurance has not kicked in.  Now she is back on insulin but is still not able to get Januvia from the pharmacy.  She is getting from doctor's office at the sample.  She is trying to lose weight and so far she has lost 30 pounds in past 6 months.  She feels the combination of Risperdal, Wellbutrin and Paxil is working well.  She has an upcoming appointment to see her primary care physician for physical and blood work.  She uses CPAP.  Patient denies any anger, mania, psychosis or any crying spells.  Her energy level is fair.  She is not drinking alcohol or using any illegal substances.  She lives with her husband who is very supportive.   Past Psychiatric History:Reviewed. No h/o suicidal attempt or inpatient treatment. H/Omood swing, anger, irritability.Seeing psychiatrist since 2003. TriedRisperdal butstoppedafter feeling better.We tried Lamictal caused itching, Abilify and Rexulti cannot afford.    Psychiatric Specialty Exam: Physical Exam  ROS  There were no  vitals taken for this visit.There is no height or weight on file to calculate BMI.  General Appearance: NA  Eye Contact:  NA  Speech:  Clear and Coherent  Volume:  Normal  Mood:  Euthymic  Affect:  NA  Thought Process:  Goal Directed  Orientation:  Full (Time, Place, and Person)  Thought Content:  Logical  Suicidal Thoughts:  No  Homicidal Thoughts:  No  Memory:  Immediate;   Good Recent;   Good Remote;   Fair  Judgement:  Fair  Insight:  Good  Psychomotor Activity:  NA  Concentration:  Concentration: Fair and Attention Span: Fair  Recall:  Good  Fund of Knowledge:  Good  Language:  Good  Akathisia:  No  Handed:  Right  AIMS (if indicated):     Assets:  Communication Skills Desire for Improvement Housing Resilience Social Support  ADL's:  Intact  Cognition:  WNL  Sleep:   ok      Assessment and Plan: Major depressive disorder, recurrent.  Generalized anxiety disorder.  Patient is a stable on her current medication.  She has lost 30 pounds in past few months.  I encouraged that she should take the insulin as prescribed as she admitted poorly compliant due to not able to get insurance approval.  Now she is back on her insurance and taking the medication regularly.  Continue Wellbutrin XL 150 mg daily, Paxil 30 mg daily and Risperdal 0.5 mg at bedtime.  Recommended to call us back if she has any question of any concern.  Follow-up in 3 months.  Follow Up Instructions:    I discussed the assessment and treatment plan with the patient. The patient was provided an opportunity to ask questions and all were answered. The patient agreed with the plan and demonstrated an understanding of the instructions.   The patient was advised to call back or seek an in-person evaluation if the symptoms worsen or if the condition fails to improve as anticipated.  I provided 20 minutes of non-face-to-face time during this encounter.   Kathlee Nations, MD

## 2019-05-12 ENCOUNTER — Other Ambulatory Visit: Payer: Self-pay

## 2019-05-12 ENCOUNTER — Ambulatory Visit
Admission: RE | Admit: 2019-05-12 | Discharge: 2019-05-12 | Disposition: A | Discharge status: Home / Self Care | Payer: BC Managed Care – PPO | Source: Ambulatory Visit | Attending: Family Medicine | Admitting: Family Medicine

## 2019-05-12 DIAGNOSIS — Z1231 Encounter for screening mammogram for malignant neoplasm of breast: Secondary | ICD-10-CM

## 2019-06-01 ENCOUNTER — Ambulatory Visit (HOSPITAL_COMMUNITY): Payer: BC Managed Care – PPO | Admitting: Psychology

## 2019-06-15 ENCOUNTER — Ambulatory Visit (HOSPITAL_COMMUNITY): Payer: BC Managed Care – PPO | Admitting: Psychology

## 2019-07-30 ENCOUNTER — Encounter (HOSPITAL_COMMUNITY): Payer: Self-pay | Admitting: Psychiatry

## 2019-07-30 ENCOUNTER — Other Ambulatory Visit: Payer: Self-pay

## 2019-07-30 ENCOUNTER — Ambulatory Visit (INDEPENDENT_AMBULATORY_CARE_PROVIDER_SITE_OTHER): Payer: BC Managed Care – PPO | Admitting: Psychiatry

## 2019-07-30 DIAGNOSIS — F330 Major depressive disorder, recurrent, mild: Secondary | ICD-10-CM | POA: Diagnosis not present

## 2019-07-30 DIAGNOSIS — F411 Generalized anxiety disorder: Secondary | ICD-10-CM | POA: Diagnosis not present

## 2019-07-30 MED ORDER — BUPROPION HCL ER (XL) 150 MG PO TB24: 150.0000 mg | ORAL_TABLET | Freq: Every day | ORAL | 2 refills | Status: DC

## 2019-07-30 MED ORDER — PAROXETINE HCL 30 MG PO TABS: 30.0000 mg | ORAL_TABLET | Freq: Every day | ORAL | 2 refills | Status: DC

## 2019-07-30 NOTE — Progress Notes (Signed)
Virtual Visit via Telephone Note  I connected with Allison [name] on 07/30/19 at  2:00 PM EST by telephone and verified that I am speaking with the correct person using two identifiers.   I discussed the limitations, risks, security and privacy concerns of performing an evaluation and management service by telephone and the availability of in person appointments. I also discussed with the patient that there may be a patient responsible charge related to this service. The patient expressed understanding and agreed to proceed.   History of Present Illness: Patient was evaluated by phone session.  She had stopped taking Resporal because she is taking pain medicine and muscle relaxant and does not want to take more medication at bedtime.  So far she is doing well and does not have any major concerns.  She is sleeping good.  She denies any crying spells or any feeling of hopelessness or worthlessness.  She is actually more relaxed since she retired in the same time with her husband and dog.  She is taking insulin and she had lost another 5 pounds since the last visit.  She feels the Paxil and Wellbutrin is working very well and she denies any feeling of sadness or any suicidal thoughts.  She has no tremors, shakes or any EPS.  She uses CPAP it is helping her sleep.  She denies any paranoia or any hallucination.  She like to continue Paxil and Wellbutrin however she agreed if symptoms started to get worse then she will resume Risperdal.   Past Psychiatric History:Reviewed. No h/o suicidal attempt or inpatient treatment. H/Omood swing, anger, irritability.Seeing psychiatrist since 2003. TriedRisperdal butstoppedafter feeling better.We tried Lamictal caused itching, Abilify and Rexulti cannot afford.     Psychiatric Specialty Exam: Physical Exam  Review of Systems  Weight (!) 324 lb (147 kg).Body mass index is 57.39 kg/m.  General Appearance: NA  Eye Contact:  NA  Speech:  Normal Rate   Volume:  Normal  Mood:  Euthymic  Affect:  NA  Thought Process:  Descriptions of Associations: Intact  Orientation:  Full (Time, Place, and Person)  Thought Content:  WDL  Suicidal Thoughts:  No  Homicidal Thoughts:  No  Memory:  Immediate;   Good Recent;   Good Remote;   Fair  Judgement:  Fair  Insight:  Present  Psychomotor Activity:  NA  Concentration:  Concentration: Fair and Attention Span: Fair  Recall:  Good  Fund of Knowledge:  Fair  Language:  Good  Akathisia:  No  Handed:  Right  AIMS (if indicated):     Assets:  Communication Skills Desire for Improvement Housing Resilience Social Support  ADL's:  Intact  Cognition:  WNL  Sleep:   good with CPAP      Assessment and Plan: Major depressive disorder, recurrent.  Generalized anxiety disorder.  Patient is not taking Risperdal as she is taking other medication and does not want to take Risperdal at bedtime.  She is taking pain medicine and muscle relaxant.  She was also given hydroxyzine for itching and that also helps her sleep.  However she like to continue Wellbutrin XL and Paxil 30 mg which is helping her depression.  She has no tremors or shakes.  Continue Wellbutrin XL 150 mg in the morning and Paxil 30 mg daily.  We will not refill Risperdal at this time.  She agreed that if symptoms started to get worse then she will call us back.  Follow-up in 3 months.  Follow Up Instructions:  I discussed the assessment and treatment plan with the patient. The patient was provided an opportunity to ask questions and all were answered. The patient agreed with the plan and demonstrated an understanding of the instructions.   The patient was advised to call back or seek an in-person evaluation if the symptoms worsen or if the condition fails to improve as anticipated.  I provided 20 minutes of non-face-to-face time during this encounter.   Kathlee Nations, MD

## 2019-10-27 ENCOUNTER — Encounter (HOSPITAL_COMMUNITY): Payer: Self-pay | Admitting: Psychiatry

## 2019-10-27 ENCOUNTER — Other Ambulatory Visit: Payer: Self-pay

## 2019-10-27 ENCOUNTER — Telehealth (INDEPENDENT_AMBULATORY_CARE_PROVIDER_SITE_OTHER): Payer: BC Managed Care – PPO | Admitting: Psychiatry

## 2019-10-27 DIAGNOSIS — F330 Major depressive disorder, recurrent, mild: Secondary | ICD-10-CM

## 2019-10-27 DIAGNOSIS — F411 Generalized anxiety disorder: Secondary | ICD-10-CM

## 2019-10-27 MED ORDER — PAROXETINE HCL 30 MG PO TABS: 30.0000 mg | ORAL_TABLET | Freq: Every day | ORAL | 2 refills | Status: DC

## 2019-10-27 MED ORDER — BUPROPION HCL ER (XL) 150 MG PO TB24: 150.0000 mg | ORAL_TABLET | Freq: Every day | ORAL | 2 refills | Status: DC

## 2019-10-27 NOTE — Progress Notes (Signed)
Virtual Visit via Telephone Note  I connected with Allison [name] on 10/27/19 at  2:20 PM EDT by telephone and verified that I am speaking with the correct person using two identifiers.   I discussed the limitations, risks, security and privacy concerns of performing an evaluation and management service by telephone and the availability of in person appointments. I also discussed with the patient that there may be a patient responsible charge related to this service. The patient expressed understanding and agreed to proceed.   History of Present Illness: Patient is evaluated by phone session.  She is taking Wellbutrin and Paxil and doing okay.  She does not feel she needs risperidone which she stopped few months ago.  She denies any irritability, anger, mania or any psychosis.  She actually polite, retired life and living with her husband and daughter.  Recently her physician is trying to adjust her diabetes medication.  She is hoping it will help her blood sugar better controlled.  She uses CPAP which is helping her sleep.  She denies any paranoia, hallucination or any anger.  Her level is okay.  She denies any panic attack.   Past Psychiatric History:Reviewed. H/O mood swing, anger and irritability. Noh/osuicidal attempt or inpatient treatment. Seeingpsychiatrist since 2003. TriedRisperdal butstoppedafter feeling better.We tried Lamictalcauseditching, Abilify and Rexulti cannot afford.  Psychiatric Specialty Exam: Physical Exam  Review of Systems  There were no vitals taken for this visit.There is no height or weight on file to calculate BMI.  General Appearance: NA  Eye Contact:  NA  Speech:  Normal Rate  Volume:  Normal  Mood:  Euthymic  Affect:  NA  Thought Process:  Descriptions of Associations: Intact  Orientation:  Full (Time, Place, and Person)  Thought Content:  WDL  Suicidal Thoughts:  No  Homicidal Thoughts:  No  Memory:  Immediate;   Good Recent;    Good Remote;   Fair  Judgement:  Intact  Insight:  Present  Psychomotor Activity:  NA  Concentration:  Concentration: Fair and Attention Span: Fair  Recall:  [employer] of Knowledge:  Fair  Language:  Good  Akathisia:  No  Handed:  Right  AIMS (if indicated):     Assets:  Communication Skills Desire for Improvement Housing Resilience Social Support  ADL's:  Intact  Cognition:  WNL  Sleep:   ok with CPAP      Assessment and Plan: Depressive disorder, recurrent.  Generalized anxiety disorder.  Patient is a stable on Wellbutrin and Paxil.  She has no tremors shakes or any EPS.  She uses CPAP it is helping her sleep.  Continue Wellbutrin XL 150 mg in the morning and Paxil 30 mg daily.  Recommended to call us back if she has any questions or any concerns.  Follow-up in 3 months.  Follow Up Instructions:    I discussed the assessment and treatment plan with the patient. The patient was provided an opportunity to ask questions and all were answered. The patient agreed with the plan and demonstrated an understanding of the instructions.   The patient was advised to call back or seek an in-person evaluation if the symptoms worsen or if the condition fails to improve as anticipated.  I provided 15 minutes of non-face-to-face time during this encounter.   Cleotis Nipper, MD

## 2020-01-20 ENCOUNTER — Other Ambulatory Visit (HOSPITAL_COMMUNITY): Payer: Self-pay | Admitting: Psychiatry

## 2020-01-20 DIAGNOSIS — F411 Generalized anxiety disorder: Secondary | ICD-10-CM

## 2020-01-20 DIAGNOSIS — F330 Major depressive disorder, recurrent, mild: Secondary | ICD-10-CM

## 2020-01-27 ENCOUNTER — Other Ambulatory Visit: Payer: Self-pay

## 2020-01-27 ENCOUNTER — Encounter (HOSPITAL_COMMUNITY): Payer: Self-pay | Admitting: Psychiatry

## 2020-01-27 ENCOUNTER — Telehealth (INDEPENDENT_AMBULATORY_CARE_PROVIDER_SITE_OTHER): Payer: BC Managed Care – PPO | Admitting: Psychiatry

## 2020-01-27 DIAGNOSIS — F419 Anxiety disorder, unspecified: Secondary | ICD-10-CM | POA: Diagnosis not present

## 2020-01-27 DIAGNOSIS — F330 Major depressive disorder, recurrent, mild: Secondary | ICD-10-CM | POA: Diagnosis not present

## 2020-01-27 MED ORDER — PAROXETINE HCL 30 MG PO TABS: 30.0000 mg | ORAL_TABLET | Freq: Every day | ORAL | 2 refills | Status: DC

## 2020-01-27 MED ORDER — BUPROPION HCL ER (XL) 150 MG PO TB24: 150.0000 mg | ORAL_TABLET | Freq: Every day | ORAL | 2 refills | Status: DC

## 2020-01-27 NOTE — Progress Notes (Signed)
Virtual Visit via Telephone Note  I connected with Allison [name] on 01/27/20 at  2:20 PM EDT by telephone and verified that I am speaking with the correct person using two identifiers.  Location: Patient: home Provider: home office   I discussed the limitations, risks, security and privacy concerns of performing an evaluation and management service by telephone and the availability of in person appointments. I also discussed with the patient that there may be a patient responsible charge related to this service. The patient expressed understanding and agreed to proceed.   History of Present Illness: Patient is evaluated by phone session.  She is taking her medication and feels it is working well.  There are some time when she gets frustrated with people but otherwise she reported her mood is stable.  She is sleeping good with the help of CPAP.  She is more relaxed since she retired.  She lives with her husband and daughter.  She is taking Jardiance to help her blood sugar better controlled.  Her last hemoglobin A1c on July.  Was 8.2.  She is hoping next meeting will be much better.  She denies any crying spells or any feeling of hopelessness or worthlessness.  She denies any panic attack.  Her energy level is fair.  She has no tremors shakes or any EPS.    Past Psychiatric History:Reviewed. H/O mood swing, anger and irritability. Noh/osuicidal attempt or inpatient treatment. Seeingpsychiatrist since 2003. TriedRisperdal butstoppedafter feeling better.We tried Lamictalcauseditching, Abilify and Rexulti cannot afford.   Psychiatric Specialty Exam: Physical Exam  Review of Systems  There were no vitals taken for this visit.There is no height or weight on file to calculate BMI.  General Appearance: NA  Eye Contact:  NA  Speech:  NA  Volume:  Decreased  Mood:  Euthymic  Affect:  NA  Thought Process:  Goal Directed  Orientation:  Full (Time, Place, and Person)  Thought  Content:  Logical  Suicidal Thoughts:  No  Homicidal Thoughts:  No  Memory:  Immediate;   Good Recent;   Fair Remote;   Fair  Judgement:  Intact  Insight:  Present  Psychomotor Activity:  NA  Concentration:  Concentration: Fair and Attention Span: Fair  Recall:  Good  Fund of Knowledge:  Good  Language:  Good  Akathisia:  No  Handed:  Right  AIMS (if indicated):     Assets:  Communication Skills Desire for Improvement Housing Social Support  ADL's:  Intact  Cognition:  WNL  Sleep:   good with CPAP      Assessment and Plan: Major depressive disorder, recurrent.  Anxiety.  Patient is a stable on her current medication.  She has not taken Risperdal for more than 6 months.  Continue Wellbutrin XL 150 mg in the morning and Paxil 30 mg daily.  Encouraged to continue to use CPAP which is helping her sleep.  Recommended to call us back if she has any question or any concern.  Follow-up in 3 months.  Follow Up Instructions:    I discussed the assessment and treatment plan with the patient. The patient was provided an opportunity to ask questions and all were answered. The patient agreed with the plan and demonstrated an understanding of the instructions.   The patient was advised to call back or seek an in-person evaluation if the symptoms worsen or if the condition fails to improve as anticipated.  I provided 20 minutes of non-face-to-face time during this encounter.   Kathi Ludwig  Dorian Furnace, MD

## 2020-04-24 ENCOUNTER — Other Ambulatory Visit (HOSPITAL_COMMUNITY): Payer: Self-pay | Admitting: Psychiatry

## 2020-04-24 DIAGNOSIS — F419 Anxiety disorder, unspecified: Secondary | ICD-10-CM

## 2020-04-24 DIAGNOSIS — F330 Major depressive disorder, recurrent, mild: Secondary | ICD-10-CM

## 2020-04-28 ENCOUNTER — Encounter (HOSPITAL_COMMUNITY): Payer: Self-pay | Admitting: Psychiatry

## 2020-04-28 ENCOUNTER — Telehealth (INDEPENDENT_AMBULATORY_CARE_PROVIDER_SITE_OTHER): Payer: BC Managed Care – PPO | Admitting: Psychiatry

## 2020-04-28 ENCOUNTER — Other Ambulatory Visit: Payer: Self-pay

## 2020-04-28 DIAGNOSIS — F419 Anxiety disorder, unspecified: Secondary | ICD-10-CM

## 2020-04-28 DIAGNOSIS — F330 Major depressive disorder, recurrent, mild: Secondary | ICD-10-CM

## 2020-04-28 MED ORDER — BUPROPION HCL ER (XL) 150 MG PO TB24: 150.0000 mg | ORAL_TABLET | Freq: Every day | ORAL | 2 refills | Status: DC

## 2020-04-28 MED ORDER — PAROXETINE HCL 30 MG PO TABS: 30.0000 mg | ORAL_TABLET | Freq: Every day | ORAL | 2 refills | Status: DC

## 2020-04-28 NOTE — Progress Notes (Signed)
Virtual Visit via Telephone Note  I connected with Allison [name] on 04/28/20 at  2:00 PM EDT by telephone and verified that I am speaking with the correct person using two identifiers.  Location: Patient: Home Provider: Home Office   I discussed the limitations, risks, security and privacy concerns of performing an evaluation and management service by telephone and the availability of in person appointments. I also discussed with the patient that there may be a patient responsible charge related to this service. The patient expressed understanding and agreed to proceed.   History of Present Illness: Patient is evaluated by phone session.  She is taking Wellbutrin and Paxil and reported no side effects.  She is also taking hydroxyzine provided by other provider which help her sleep.  She uses CPAP.  She continues to struggle with her blood sugar and she has upcoming appointment with her endocrinologist and she is nervous and anxious about the results.  Recently her physician changed her cholesterol medicine and she is now taking the new medicine.  Her last hemoglobin A1c was 8.2.  She is concerned about her daughter who is 25 year old and may require back surgery in coming weeks.  Patient told she has back issues which is by birth and may require surgery.  Patient denies any mood swings, anger, agitation.  She lives with her husband and daughter.  She is retired from work.  She does not want to change medication.  She feels the medicine is working and denies any crying spells or any feeling of hopelessness or worthlessness.   Past Psychiatric History:Reviewed. H/O mood swing, anger and irritability.Noh/osuicidal attempt or inpatient treatment. Seeingpsychiatrist since 2003. TriedRisperdal butstoppedafter feeling better.We tried Lamictalcauseditching, Abilify and Rexulti cannot afford.   Psychiatric Specialty Exam: Physical Exam  Review of Systems  Weight (!) 335 lb (152  kg).There is no height or weight on file to calculate BMI.  General Appearance: NA  Eye Contact:  NA  Speech:  Slow  Volume:  Decreased  Mood:  Euthymic  Affect:  NA  Thought Process:  Goal Directed  Orientation:  Full (Time, Place, and Person)  Thought Content:  Logical  Suicidal Thoughts:  No  Homicidal Thoughts:  No  Memory:  Immediate;   Good Recent;   Fair Remote;   Fair  Judgement:  Fair  Insight:  Present  Psychomotor Activity:  NA  Concentration:  Concentration: Fair and Attention Span: Fair  Recall:  Good  Fund of Knowledge:  Good  Language:  Good  Akathisia:  No  Handed:  Right  AIMS (if indicated):     Assets:  Communication Skills Desire for Improvement Housing Resilience Social Support  ADL's:  Intact  Cognition:  WNL  Sleep:   good with CPAP      Assessment and Plan: Major depressive disorder, recurrent.  Anxiety.  Discussed current medication.  She like to keep the Wellbutrin XL 150 mg in the morning and Paxil 20 mg daily.  She feels her mood swings anxiety and depression is a stable.  Encouraged healthy lifestyle and watch her caloric intake.  She has upcoming appointment with her endocrinologist for her hemoglobin A1c.  Discussed medication side effects and benefits.  Recommended to call us back if she has any question or any concern.  Follow-up in 3 months.  Follow Up Instructions:    I discussed the assessment and treatment plan with the patient. The patient was provided an opportunity to ask questions and all were answered. The patient agreed  with the plan and demonstrated an understanding of the instructions.   The patient was advised to call back or seek an in-person evaluation if the symptoms worsen or if the condition fails to improve as anticipated.  I provided 15 minutes of non-face-to-face time during this encounter.   Cleotis Nipper, MD

## 2020-07-27 ENCOUNTER — Telehealth (INDEPENDENT_AMBULATORY_CARE_PROVIDER_SITE_OTHER): Payer: BC Managed Care – PPO | Admitting: Psychiatry

## 2020-07-27 ENCOUNTER — Other Ambulatory Visit: Payer: Self-pay

## 2020-07-27 ENCOUNTER — Encounter (HOSPITAL_COMMUNITY): Payer: Self-pay | Admitting: Psychiatry

## 2020-07-27 DIAGNOSIS — F419 Anxiety disorder, unspecified: Secondary | ICD-10-CM | POA: Diagnosis not present

## 2020-07-27 DIAGNOSIS — F330 Major depressive disorder, recurrent, mild: Secondary | ICD-10-CM | POA: Diagnosis not present

## 2020-07-27 MED ORDER — PAROXETINE HCL 30 MG PO TABS: 30.0000 mg | ORAL_TABLET | Freq: Every day | ORAL | 2 refills | Status: DC

## 2020-07-27 MED ORDER — BUPROPION HCL ER (XL) 150 MG PO TB24: 150.0000 mg | ORAL_TABLET | Freq: Every day | ORAL | 2 refills | Status: DC

## 2020-07-27 NOTE — Progress Notes (Signed)
Virtual Visit via Telephone Note  I connected with Allison [name] on 07/27/20 at  2:00 PM EST by telephone and verified that I am speaking with the correct person using two identifiers.  Location: Patient: Home Provider: Home Office   I discussed the limitations, risks, security and privacy concerns of performing an evaluation and management service by telephone and the availability of in person appointments. I also discussed with the patient that there may be a patient responsible charge related to this service. The patient expressed understanding and agreed to proceed.   History of Present Illness: Patient is evaluated by phone session.  She is on the phone by herself.  Patient had good Christmas.  She went to visit her daughter and had a good time with the grandkids.  Patient is concerned about her general health.  She has difficulty affording the medication for psoriasis and eczema because they are expensive.  Overall she feels her mood is stable and denies any anger, crying spells or any feeling of hopelessness or worthlessness.  Her last hemoglobin A1c was 7.5 which was done in October.  She uses CPAP machine which helps her sleep.  She denies any panic attack.  Her weight is unchanged from the past because she admitted not compliant with the diet.  She has no tremors or shakes.  She like to keep the current medication.   Past Psychiatric History:Reviewed. H/O mood swing, anger and irritability.Noh/osuicidal attempt or inpatient treatment. Seeingpsychiatrist since 2003. TriedRisperdal butstoppedafter feeling better.We tried Lamictalcauseditching, Abilify and Rexulti cannot afford.   Psychiatric Specialty Exam: Physical Exam  Review of Systems  Weight (!) 355 lb (161 kg).There is no height or weight on file to calculate BMI.  General Appearance: NA  Eye Contact:  NA  Speech:  Slow  Volume:  Decreased  Mood:  Euthymic  Affect:  NA  Thought Process:  Coherent   Orientation:  Full (Time, Place, and Person)  Thought Content:  WDL  Suicidal Thoughts:  No  Homicidal Thoughts:  No  Memory:  Immediate;   Good Recent;   Good Remote;   Fair  Judgement:  Fair  Insight:  Shallow  Psychomotor Activity:  NA  Concentration:  Concentration: Fair and Attention Span: Fair  Recall:  Good  Fund of Knowledge:  Good  Language:  Good  Akathisia:  No  Handed:  Right  AIMS (if indicated):     Assets:  Communication Skills Desire for Improvement Housing Social Support  ADL's:  Intact  Cognition:  WNL  Sleep:   ok with CPAP      Assessment and Plan: Major depressive disorder, recurrent.  Anxiety.  Patient is stable on her current medication.  Continue Wellbutrin XL 150 mg in the morning and Paxil 20 mg daily.  Discussed healthy lifestyle and watching her calorie intake.  Recommended to call us back if is any question or any concern.  Follow-up in 3 months.  Follow Up Instructions:    I discussed the assessment and treatment plan with the patient. The patient was provided an opportunity to ask questions and all were answered. The patient agreed with the plan and demonstrated an understanding of the instructions.   The patient was advised to call back or seek an in-person evaluation if the symptoms worsen or if the condition fails to improve as anticipated.  I provided 16 minutes of non-face-to-face time during this encounter.   Cleotis Nipper, MD

## 2020-10-21 ENCOUNTER — Telehealth (INDEPENDENT_AMBULATORY_CARE_PROVIDER_SITE_OTHER): Payer: BC Managed Care – PPO | Admitting: Psychiatry

## 2020-10-21 ENCOUNTER — Encounter (HOSPITAL_COMMUNITY): Payer: Self-pay | Admitting: Psychiatry

## 2020-10-21 ENCOUNTER — Other Ambulatory Visit: Payer: Self-pay

## 2020-10-21 DIAGNOSIS — F330 Major depressive disorder, recurrent, mild: Secondary | ICD-10-CM | POA: Diagnosis not present

## 2020-10-21 DIAGNOSIS — F419 Anxiety disorder, unspecified: Secondary | ICD-10-CM | POA: Diagnosis not present

## 2020-10-21 MED ORDER — BUPROPION HCL ER (XL) 150 MG PO TB24: 150.0000 mg | ORAL_TABLET | Freq: Every day | ORAL | 2 refills | Status: DC

## 2020-10-21 MED ORDER — PAROXETINE HCL 30 MG PO TABS: 30.0000 mg | ORAL_TABLET | Freq: Every day | ORAL | 2 refills | Status: DC

## 2020-10-21 NOTE — Progress Notes (Signed)
Virtual Visit via Telephone Note  I connected with Allison [name] on 10/21/20 at 10:00 AM EDT by telephone and verified that I am speaking with the correct person using two identifiers.  Location: Patient: Home Provider: Home Office   I discussed the limitations, risks, security and privacy concerns of performing an evaluation and management service by telephone and the availability of in person appointments. I also discussed with the patient that there may be a patient responsible charge related to this service. The patient expressed understanding and agreed to proceed.   History of Present Illness: Patient is evaluated by phone session.  Today she is tired because yesterday she was during her grandmother's duty.  She spent yesterday with the grandkids and also doing groceries and other things and today she is catching up.  Overall she described her mood is stable and she denies any irritability, crying spells, anxiety attack or any feeling of hopelessness.  She is trying to lose weight.  She had lost 3 pounds since the last visit.  Her last hemoglobin A1c on February 14 was 7.8 slightly increased from the past.  She is trying to watch her calorie intake and also start walking.  She still have issues getting her medicine for psoriasis and now referred to dermatologist.  Patient does not want to change the medication since it is working well.  She is sleeping good with the help of CPAP.  She has no tremors, shakes or any EPS.   Past Psychiatric History:Reviewed. H/O mood swing, anger and irritability.Noh/osuicidal attempt or inpatient treatment. Seeingpsychiatrist since 2003. TriedRisperdal butstoppedafter feeling better.We tried Lamictalcauseditching, Abilify and Rexulti cannot afford.   Psychiatric Specialty Exam: Physical Exam  Review of Systems  Weight (!) 340 lb (154.2 kg).Body mass index is 60.23 kg/m.  General Appearance: NA  Eye Contact:  NA  Speech:  Slow  Volume:   Decreased  Mood:  Anxious  Affect:  NA  Thought Process:  Goal Directed  Orientation:  Full (Time, Place, and Person)  Thought Content:  WDL  Suicidal Thoughts:  No  Homicidal Thoughts:  No  Memory:  Immediate;   Good Recent;   Fair Remote;   Fair  Judgement:  Intact  Insight:  Present  Psychomotor Activity:  NA  Concentration:  Concentration: Fair and Attention Span: Fair  Recall:  Good  Fund of Knowledge:  Good  Language:  Good  Akathisia:  No  Handed:  Right  AIMS (if indicated):     Assets:  Communication Skills Desire for Improvement Housing Resilience Social Support Transportation  ADL's:  Intact  Cognition:  WNL  Sleep:   ok use CPAP      Assessment and Plan: Major depressive disorder, recurrent.  Anxiety.  Continue Wellbutrin XL 150 mg in the morning and Paxil 40 mg daily.  Patient is a stable and denies any tremors shakes or any EPS.  She is not interested in therapy.  I reviewed blood work results.  Her hemoglobin A1c is 7.8 which was done on February 14.  She has another blood work coming up next month.  Recommended to call us back if she has any question or any concern.  Recommend to continue to watch her calorie intake as patient trying to lose weight.  Follow-up in 3 months.  Follow Up Instructions:    I discussed the assessment and treatment plan with the patient. The patient was provided an opportunity to ask questions and all were answered. The patient agreed with the plan  and demonstrated an understanding of the instructions.   The patient was advised to call back or seek an in-person evaluation if the symptoms worsen or if the condition fails to improve as anticipated.  I provided 12 minutes of non-face-to-face time during this encounter.   Cleotis Nipper, MD

## 2020-10-24 ENCOUNTER — Telehealth (HOSPITAL_COMMUNITY): Payer: BC Managed Care – PPO | Admitting: Psychiatry

## 2020-10-25 ENCOUNTER — Telehealth (HOSPITAL_COMMUNITY): Payer: BC Managed Care – PPO | Admitting: Psychiatry

## 2020-12-14 ENCOUNTER — Telehealth: Payer: Self-pay | Admitting: Internal Medicine

## 2020-12-14 NOTE — Telephone Encounter (Signed)
Good morning Dr. Leone Payor, we received a referral from [facility].  Patient was seen by them in 10/2020 for dysphagia.  Will send referral to you.  Can you please review and advise on scheduling?  Thank you.

## 2020-12-22 NOTE — Telephone Encounter (Signed)
Request received to transfer GI care from outside practice to [facility].  We appreciate the interest in our practice, however at this time due to high demand from patients without established GI providers we cannot accommodate this transfer.  Ability to accommodate future transfer requests may change over time and the patient can contact us again in 6-12 months if still interested in being seen at  GI.      °

## 2020-12-23 NOTE — Telephone Encounter (Signed)
Patient informed, cannot accommodate transfer of care.

## 2021-01-19 ENCOUNTER — Other Ambulatory Visit (HOSPITAL_COMMUNITY): Payer: Self-pay | Admitting: Psychiatry

## 2021-01-19 DIAGNOSIS — F330 Major depressive disorder, recurrent, mild: Secondary | ICD-10-CM

## 2021-01-19 DIAGNOSIS — F419 Anxiety disorder, unspecified: Secondary | ICD-10-CM

## 2021-01-31 ENCOUNTER — Telehealth (INDEPENDENT_AMBULATORY_CARE_PROVIDER_SITE_OTHER): Payer: BC Managed Care – PPO | Admitting: Psychiatry

## 2021-01-31 ENCOUNTER — Other Ambulatory Visit: Payer: Self-pay

## 2021-01-31 ENCOUNTER — Encounter (HOSPITAL_COMMUNITY): Payer: Self-pay | Admitting: Psychiatry

## 2021-01-31 DIAGNOSIS — F419 Anxiety disorder, unspecified: Secondary | ICD-10-CM | POA: Diagnosis not present

## 2021-01-31 DIAGNOSIS — F330 Major depressive disorder, recurrent, mild: Secondary | ICD-10-CM | POA: Diagnosis not present

## 2021-01-31 MED ORDER — BUPROPION HCL ER (XL) 150 MG PO TB24: 150.0000 mg | ORAL_TABLET | Freq: Every day | ORAL | 2 refills | Status: DC

## 2021-01-31 MED ORDER — PAROXETINE HCL 30 MG PO TABS: 30.0000 mg | ORAL_TABLET | Freq: Every day | ORAL | 2 refills | Status: DC

## 2021-01-31 NOTE — Progress Notes (Signed)
Virtual Visit via Telephone Note  I connected with Allison [name] on 01/31/21 at  1:20 PM EDT by telephone and verified that I am speaking with the correct person using two identifiers.  Location: Patient: home Provider: home office   I discussed the limitations, risks, security and privacy concerns of performing an evaluation and management service by telephone and the availability of in person appointments. I also discussed with the patient that there may be a patient responsible charge related to this service. The patient expressed understanding and agreed to proceed.   History of Present Illness: Patient is evaluated by phone session.  Today she is not happy because not able to see GI doctor and she is having stomach issues for more than 3 months.  Patient told the PCP referred her to GI but they are not taking any new patient and she cannot afford going out of the network.  Overall she feels her anxiety is increased because of the situation but manageable.  She is taking Wellbutrin and Paxil that is helping her depression.  She is sleeping okay with the CPAP.  She had a blood work in May and her hemoglobin A1c is slightly improved from 7.8-7.4.  She wants to keep the current medication since it is working and her depression anxiety is manageable.  She lives with her husband and she is been married for more than 40 years.  She admitted there are days when they have an argument but no major issues.  Patient has no tremors, shakes or any EPS.  Past Psychiatric History: Reviewed. H/O mood swing, anger and irritability. No h/o suicidal attempt or inpatient treatment.  Seeing psychiatrist since 2003. Tried Risperdal but stopped after feeling better. We tried Lamictal caused itching, Abilify and Rexulti cannot afford.     Psychiatric Specialty Exam: Physical Exam  Review of Systems  There were no vitals taken for this visit.There is no height or weight on file to calculate BMI.  General  Appearance: NA  Eye Contact:  NA  Speech:  Slow  Volume:  Decreased  Mood:  Anxious  Affect:  NA  Thought Process:  Goal Directed  Orientation:  Full (Time, Place, and Person)  Thought Content:  Logical  Suicidal Thoughts:  No  Homicidal Thoughts:  No  Memory:  Immediate;   Good Recent;   Fair Remote;   Fair  Judgement:  Intact  Insight:  Present  Psychomotor Activity:  NA  Concentration:  Concentration: Fair and Attention Span: Fair  Recall:  [employer] of Knowledge:  Good  Language:  Good  Akathisia:  No  Handed:  Right  AIMS (if indicated):     Assets:  Communication Skills Desire for Improvement Housing Transportation  ADL's:  Intact  Cognition:  WNL  Sleep:   ok with cpap      Assessment and Plan: Major depressive disorder, recurrent.  Anxiety.  Patient reported her symptoms are manageable and she like to keep the current medication.  She is not interested in therapy.  Her last hemoglobin A1c which was done in May was 7.4.  Continue Wellbutrin XL 150 mg in the morning and Paxil 40 mg daily.  Recommended to call us back if she has any question or any concern.  Follow-up in 3 months.  Follow Up Instructions:    I discussed the assessment and treatment plan with the patient. The patient was provided an opportunity to ask questions and all were answered. The patient agreed with the plan  and demonstrated an understanding of the instructions.   The patient was advised to call back or seek an in-person evaluation if the symptoms worsen or if the condition fails to improve as anticipated.  I provided 16 minutes of non-face-to-face time during this encounter.   Cleotis Nipper, MD

## 2021-04-27 ENCOUNTER — Other Ambulatory Visit: Payer: Self-pay

## 2021-04-27 ENCOUNTER — Encounter (HOSPITAL_COMMUNITY): Payer: Self-pay | Admitting: Psychiatry

## 2021-04-27 ENCOUNTER — Telehealth (HOSPITAL_BASED_OUTPATIENT_CLINIC_OR_DEPARTMENT_OTHER): Payer: BC Managed Care – PPO | Admitting: Psychiatry

## 2021-04-27 DIAGNOSIS — F419 Anxiety disorder, unspecified: Secondary | ICD-10-CM | POA: Diagnosis not present

## 2021-04-27 DIAGNOSIS — F330 Major depressive disorder, recurrent, mild: Secondary | ICD-10-CM

## 2021-04-27 MED ORDER — BUPROPION HCL ER (XL) 150 MG PO TB24: 150.0000 mg | ORAL_TABLET | Freq: Every day | ORAL | 2 refills | Status: DC

## 2021-04-27 MED ORDER — PAROXETINE HCL 30 MG PO TABS: 30.0000 mg | ORAL_TABLET | Freq: Every day | ORAL | 2 refills | Status: DC

## 2021-04-27 NOTE — Progress Notes (Signed)
Virtual Visit via Telephone Note  I connected with Allison [name] on 04/27/21 at  1:20 PM EDT by telephone and verified that I am speaking with the correct person using two identifiers.  Location: Patient: Home Provider: Office   I discussed the limitations, risks, security and privacy concerns of performing an evaluation and management service by telephone and the availability of in person appointments. I also discussed with the patient that there may be a patient responsible charge related to this service. The patient expressed understanding and agreed to proceed.   History of Present Illness: This is evaluated by phone session.  She had COVID and now slowly and gradually getting better.  She endorsed tiredness, chronic pain and headaches.  Patient told her husband also had a COVID but he recover faster than her.  Overall she feels Paxil and Wellbutrin working well and her depression is stable and symptoms are manageable.  She admitted some financial stress and missed appointment with the PCP.  She is taking multiple medication and now she has to see dermatologist for eczema.  She is itching and takes the hydroxyzine every day.  She sleeps okay with the help of CPAP.  Her weight remains unchanged from the past.  She had a plan to spend Thanksgiving with a daughter who lives close by.  Patient lives with her husband.  Patient denies any paranoia, crying spells, suicidal thoughts or any feeling of hopelessness or worthlessness.  Her last hemoglobin A1c was 7.4.    Past Psychiatric History: Reviewed. H/O mood swing, anger and irritability. No h/o suicidal attempt or inpatient treatment.  Seeing psychiatrist since 2003. Tried Risperdal but stopped after feeling better. We tried Lamictal caused itching, Abilify and Rexulti cannot afford.      Psychiatric Specialty Exam: Physical Exam  Review of Systems  Weight (!) 344 lb (156 kg).There is no height or weight on file to calculate BMI.  General  Appearance: NA  Eye Contact:  NA  Speech:  Slow  Volume:  Decreased  Mood:  Dysphoric  Affect:  NA  Thought Process:  Goal Directed  Orientation:  Full (Time, Place, and Person)  Thought Content:  WDL  Suicidal Thoughts:  No  Homicidal Thoughts:  No  Memory:  Immediate;   Good Recent;   Fair Remote;   Fair  Judgement:  Intact  Insight:  Present  Psychomotor Activity:  NA  Concentration:  Concentration: Fair and Attention Span: Fair  Recall:  Good  Fund of Knowledge:  Good  Language:  Good  Akathisia:  No  Handed:  Right  AIMS (if indicated):     Assets:  Communication Skills Desire for Improvement Housing Social Support Transportation  ADL's:  Intact  Cognition:  WNL  Sleep:   ok with CPAP      Assessment and Plan: Major depressive disorder, recurrent.  Anxiety.  Patient slowly and gradually recovering from COVID infection.  She is still tired but denies any worsening of depression or anxiety.  Encouraged to keep appointment with the PCP for blood work.  Continue Wellbutrin XL 150 mg in the morning and 40 mg Paxil every day.  Recommended to call us back if she is any question or any concern.  Follow-up in 3 months.  Follow Up Instructions:    I discussed the assessment and treatment plan with the patient. The patient was provided an opportunity to ask questions and all were answered. The patient agreed with the plan and demonstrated an understanding of the instructions.  The patient was advised to call back or seek an in-person evaluation if the symptoms worsen or if the condition fails to improve as anticipated.  I provided 21 minutes of non-face-to-face time during this encounter.   Cleotis Nipper, MD

## 2021-07-26 ENCOUNTER — Encounter (HOSPITAL_COMMUNITY): Payer: Self-pay | Admitting: Psychiatry

## 2021-07-26 ENCOUNTER — Other Ambulatory Visit: Payer: Self-pay

## 2021-07-26 ENCOUNTER — Telehealth (HOSPITAL_BASED_OUTPATIENT_CLINIC_OR_DEPARTMENT_OTHER): Payer: BC Managed Care – PPO | Admitting: Psychiatry

## 2021-07-26 DIAGNOSIS — F419 Anxiety disorder, unspecified: Secondary | ICD-10-CM | POA: Diagnosis not present

## 2021-07-26 DIAGNOSIS — F330 Major depressive disorder, recurrent, mild: Secondary | ICD-10-CM | POA: Diagnosis not present

## 2021-07-26 MED ORDER — PAROXETINE HCL 30 MG PO TABS: 30.0000 mg | ORAL_TABLET | Freq: Every day | ORAL | 2 refills | Status: DC

## 2021-07-26 MED ORDER — BUPROPION HCL ER (XL) 150 MG PO TB24: 150.0000 mg | ORAL_TABLET | Freq: Every day | ORAL | 2 refills | Status: DC

## 2021-07-26 NOTE — Progress Notes (Signed)
Virtual Visit via Telephone Note  I connected with Allison [name] on 07/26/21 at  1:20 PM EST by telephone and verified that I am speaking with the correct person using two identifiers.  Location: Patient: Home Provider: Home Office   I discussed the limitations, risks, security and privacy concerns of performing an evaluation and management service by telephone and the availability of in person appointments. I also discussed with the patient that there may be a patient responsible charge related to this service. The patient expressed understanding and agreed to proceed.   History of Present Illness: Patient is evaluated by phone session.  She reported had a difficult month in December because she was out of her skin medication and she was very depressed.  She reported having issues with insurance but now she is back on skin medicine.  She is happy with her rheumatologist who finally find a medicine to help her eczema.  And she is hoping once she continues to get medicine her eczema will get better.  She had a quite Christmas.  She is sleeping good with the help of CPAP.  She denies any paranoia, crying spells or any feeling of hopelessness.  She reported some anxiety and nervousness because of her potential stress but denies any suicidal thoughts.  She lives with her husband and her 2 pets.  She recently seen her PCP Dr. Katrinka Blazing at [facility] and pleased to see her weight reduced and her hemoglobin A1c also improved.  She does not know the exact number but it is around 6.  She admitted not able to see the endocrinologist Dr. Allena Katz but hoping to have next follow-up with Dr. Allena Katz at [facility].  She denies any panic attack.  She denies any tremors shakes or any EPS.    Past Psychiatric History: Reviewed. H/O mood swing, anger and irritability. No h/o suicidal attempt or inpatient treatment.  Seeing psychiatrist since 2003. Tried Risperdal but stopped after feeling better. We tried  Lamictal caused itching, Abilify and Rexulti cannot afford.   Psychiatric Specialty Exam: Physical Exam  Review of Systems  Weight (!) 340 lb (154.2 kg).There is no height or weight on file to calculate BMI.  General Appearance: NA  Eye Contact:  NA  Speech:  Clear and Coherent  Volume:  Normal  Mood:  Anxious  Affect:  NA  Thought Process:  Goal Directed  Orientation:  Full (Time, Place, and Person)  Thought Content:  WDL  Suicidal Thoughts:  No  Homicidal Thoughts:  No  Memory:  Immediate;   Good Recent;   Good Remote;   Good  Judgement:  Intact  Insight:  Present  Psychomotor Activity:  NA  Concentration:  Concentration: Fair and Attention Span: Fair  Recall:  Good  Fund of Knowledge:  Fair  Language:  Good  Akathisia:  No  Handed:  Right  AIMS (if indicated):     Assets:  Communication Skills Desire for Improvement Housing Social Support Transportation  ADL's:  Intact  Cognition:  WNL  Sleep:   ok with CPAP      Assessment and Plan: Major depressive disorder, recurrent.  Anxiety.  Patient like to keep her current medication.  She has no concerns.  Continue Wellbutrin XL 150 mg in the morning and Paxil 40 mg daily.  Recommended to call us back if she is any question of any concern.  Follow-up in 3 months.    Follow Up Instructions:    I discussed the assessment and  treatment plan with the patient. The patient was provided an opportunity to ask questions and all were answered. The patient agreed with the plan and demonstrated an understanding of the instructions.   The patient was advised to call back or seek an in-person evaluation if the symptoms worsen or if the condition fails to improve as anticipated.  I provided 19 minutes of non-face-to-face time during this encounter.   Cleotis Nipper, MD

## 2021-10-13 ENCOUNTER — Ambulatory Visit (INDEPENDENT_AMBULATORY_CARE_PROVIDER_SITE_OTHER): Payer: BC Managed Care – PPO | Admitting: Cardiovascular Disease

## 2021-10-13 ENCOUNTER — Encounter: Payer: Self-pay | Admitting: Cardiovascular Disease

## 2021-10-13 DIAGNOSIS — I739 Peripheral vascular disease, unspecified: Secondary | ICD-10-CM | POA: Diagnosis not present

## 2021-10-13 DIAGNOSIS — E785 Hyperlipidemia, unspecified: Secondary | ICD-10-CM | POA: Insufficient documentation

## 2021-10-13 DIAGNOSIS — I25110 Atherosclerotic heart disease of native coronary artery with unstable angina pectoris: Secondary | ICD-10-CM

## 2021-10-13 DIAGNOSIS — R030 Elevated blood-pressure reading, without diagnosis of hypertension: Secondary | ICD-10-CM

## 2021-10-13 DIAGNOSIS — E782 Mixed hyperlipidemia: Secondary | ICD-10-CM

## 2021-10-13 NOTE — Assessment & Plan Note (Signed)
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  She had no disease on the left with three-vessel runoff bilaterally.  She now complains of left lower extremity claudication.  We will check aortoiliac and lower extremity arterial Doppler studies. ?

## 2021-10-13 NOTE — Assessment & Plan Note (Signed)
History of CAD status post remote stenting of her RCA with overlapping Promus drug-eluting stents.  Cath performed July 2012 showed an occluded dominant RCA with normal LV function.  She has had increased frequency of sublingual nitrate responsive chest pain over the last month or 2 and recently had a stress test performed Dr. Hanley Hays which she is scheduled to review with her this coming Monday. ?

## 2021-10-13 NOTE — Assessment & Plan Note (Signed)
History of hyperlipidemia on statin therapy followed by her PCP. 

## 2021-10-13 NOTE — Assessment & Plan Note (Signed)
History of essential hypertension with blood pressure measured today at 166/88.  She is on metoprolol, olmesartan, amlodipine and hydrochlorothiazide. ?

## 2021-10-13 NOTE — Progress Notes (Signed)
? ? ? ?10/13/2021 ?[place]   ?1960-12-23  ?[id number] ? ?[place] ?Primary Cardiologist: Lorretta Harp MD Lupe Car[place], [place] ? ?HPI:  Allison [name] is a 61 y.o. morbidly overweight married Caucasian female mother of 58, grandmother of 1 grandchild who currently does not work and was referred to me by Dr. Claudie Leach, her cardiologist, for peripheral vascular evaluation.  She stopped smoking in 2012 at the time of her cardiac catheterization.  She does have treated hypertension, hyperlipidemia and diabetes.  I stented her right extrailiac artery in 2012.  That time her left lower extremity was free of disease.  She currently complains of left lower extremity claudication.  She is also had accelerating angina which is nitrate responsive the last month or 2.  She had COVID back in September.  She also has morbid obesity. ? ? ?Current Meds  ?Medication Sig  ? albuterol (PROVENTIL HFA;VENTOLIN HFA) 108 (90 BASE) MCG/ACT inhaler Inhale 2 puffs into the lungs every 6 (six) hours as needed for wheezing or shortness of breath.  ? aspirin EC 81 MG tablet Take 81 mg by mouth every other day.  ? atorvastatin (LIPITOR) 10 MG tablet Take 10 mg by mouth daily.  ? buPROPion (WELLBUTRIN XL) 150 MG 24 hr tablet Take 1 tablet (150 mg total) by mouth daily.  ? Choline Fenofibrate 135 MG capsule Take 135 mg by mouth daily.    ? cilostazol (PLETAL) 100 MG tablet Take 100 mg by mouth 2 (two) times daily.  ? clopidogrel (PLAVIX) 75 MG tablet Take 75 mg by mouth daily.    ? HYDROcodone-acetaminophen (NORCO) 10-325 MG tablet TAKE 1 TABLET BY MOUTH TWICE DAILY AS NEEDED FOR SEVERE PAIN  ? hydrOXYzine (ATARAX/VISTARIL) 25 MG tablet Take 25 mg by mouth every 6 (six) hours as needed for itching.  ? Insulin Glargine (BASAGLAR KWIKPEN) 100 UNIT/ML SMARTSIG:90 Unit(s) SUB-Q Every Night  ? isosorbide mononitrate (IMDUR) 60 MG 24 hr tablet Take 60 mg by mouth daily.  ? JARDIANCE 25 MG TABS tablet Take 25  mg by mouth daily.  ? lubiprostone (AMITIZA) 8 MCG capsule Take 8 mcg by mouth 2 (two) times daily with a meal.  ? Magnesium Oxide 250 MG TABS Take 1 tablet by mouth daily.  ? meloxicam (MOBIC) 15 MG tablet TAKE 1 TABLET BY MOUTH IN THE MORNING AS NEEDED WITH FOOD  ? methocarbamol (ROBAXIN) 500 MG tablet Take 1 tablet (500 mg total) by mouth every 8 (eight) hours as needed for muscle spasms.  ? metoprolol tartrate (LOPRESSOR) 50 MG tablet Take 50 mg by mouth 2 (two) times daily.  ? nitroGLYCERIN (NITROSTAT) 0.4 MG SL tablet PLACE 1 TONGUE UNDER TONGUE EVERY 5 MINUTES IF NEEDED FOR CHEST PAIN  ? NOVOLOG 100 UNIT/ML injection 40 Units in the morning, at noon, and at bedtime.  ? Olmesartan-amLODIPine-HCTZ 40-5-25 MG TABS Take 1 tablet by mouth daily.   ? PARoxetine (PAXIL) 30 MG tablet Take 1 tablet (30 mg total) by mouth daily.  ? potassium chloride SA (K-DUR,KLOR-CON) 20 MEQ tablet Take 20 mEq by mouth daily.  ? ranolazine (RANEXA) 1000 MG SR tablet Take 500 mg by mouth 2 (two) times daily.  ? Semaglutide,0.25 or 0.5MG /DOS, 2 MG/1.5ML SOPN Inject 0.25 mg into the skin.  ? TREMFYA 100 MG/ML SOPN Inject into the skin.  ? Vitamin D, Ergocalciferol, (DRISDOL) 50000 UNITS CAPS capsule Take 50,000 Units by mouth every 7 (seven) days.  ?  ? ?Allergies  ?Allergen  Reactions  ? Sulfonamide Derivatives Anaphylaxis  ? Aspirin Nausea And Vomiting  ? Penicillins Hives  ? Latex Rash  ? ? ?Social History  ? ?Socioeconomic History  ? Marital status: Married  ?  Spouse name: Not on file  ? Number of children: Not on file  ? Years of education: Not on file  ? Highest education level: Not on file  ?Occupational History  ? Not on file  ?Tobacco Use  ? Smoking status: Former  ?  Types: Cigarettes  ?  Quit date: 12/23/2009  ?  Years since quitting: 11.8  ? Smokeless tobacco: Never  ?Vaping Use  ? Vaping Use: Never used  ?Substance and Sexual Activity  ? Alcohol use: No  ?  Alcohol/week: 0.0 standard drinks  ? Drug use: No  ? Sexual activity:  Not on file  ?Other Topics Concern  ? Not on file  ?Social History Narrative  ? Not on file  ? ?Social Determinants of Health  ? ?Financial Resource Strain: Not on file  ?Food Insecurity: Not on file  ?Transportation Needs: Not on file  ?Physical Activity: Not on file  ?Stress: Not on file  ?Social Connections: Not on file  ?Intimate Partner Violence: Not on file  ?  ? ?Review of Systems: ?General: negative for chills, fever, night sweats or weight changes.  ?Cardiovascular: negative for chest pain, dyspnea on exertion, edema, orthopnea, palpitations, paroxysmal nocturnal dyspnea or shortness of breath ?Dermatological: negative for rash ?Respiratory: negative for cough or wheezing ?Urologic: negative for hematuria ?Abdominal: negative for nausea, vomiting, diarrhea, bright red blood per rectum, melena, or hematemesis ?Neurologic: negative for visual changes, syncope, or dizziness ?All other systems reviewed and are otherwise negative except as noted above. ? ? ? ?Blood pressure (!) 166/88, pulse 78, height 5\' 2"  (1.575 m), weight (!) 346 lb (156.9 kg), SpO2 94 %.  ?General appearance: alert and no distress ?Neck: no adenopathy, no carotid bruit, no JVD, supple, symmetrical, trachea midline, and thyroid not enlarged, symmetric, no tenderness/mass/nodules ?Lungs: clear to auscultation bilaterally ?Heart: regular rate and rhythm, S1, S2 normal, no murmur, click, rub or gallop ?Extremities: extremities normal, atraumatic, no cyanosis or edema ?Pulses: 2+ and symmetric ?Skin: Skin color, texture, turgor normal. No rashes or lesions ?Neurologic: Grossly normal ? ?EKG sinus rhythm at 78 with lateral T wave inversion in leads I and L.  I personally reviewed this EKG. ? ?ASSESSMENT AND PLAN:  ? ?HYPERTENSION, WHITE COAT ?History of essential hypertension with blood pressure measured today at 166/88.  She is on metoprolol, olmesartan, amlodipine and hydrochlorothiazide. ? ?CORONARY ATHEROSCLEROSIS, NATIVE VESSEL ?History of  CAD status post remote stenting of her RCA with overlapping Promus drug-eluting stents.  Cath performed July 2012 showed an occluded dominant RCA with normal LV function.  She has had increased frequency of sublingual nitrate responsive chest pain over the last month or 2 and recently had a stress test performed Dr. Claudie Leach which she is scheduled to review with her this coming Monday. ? ?Peripheral arterial disease ([place]) ?History of right external iliac artery PTA and stenting by myself 12/09/2010.  She had no disease on the left with three-vessel runoff bilaterally.  She now complains of left lower extremity claudication.  We will check aortoiliac and lower extremity arterial Doppler studies. ? ?Hyperlipidemia ?History of hyperlipidemia on statin therapy followed by her PCP. ? ? ? ? ?Lorretta Harp MD FACP,FACC,FAHA, FSCAI ?10/13/2021 ?2:11 PM ?

## 2021-10-13 NOTE — Patient Instructions (Signed)
Medication Instructions:  ?Your physician recommends that you continue on your current medications as directed. Please refer to the Current Medication list given to you today. ? ?*If you need a refill on your cardiac medications before your next appointment, please call your pharmacy* ? ? ?Testing/Procedures: ?Dr. Gwenlyn Found has recommended that you have an Ultrasound of your AORTA/IVC/ILIACS.  ? ?To prepare for this test: ? ?No food after 11PM the night before. Water is OK. (Don't drink liquids if you have been instructed not to for ANOTHER test).  ?Avoid foods that produce bowel gas, for 24 hours prior to exam (see below). ?No breakfast, no chewing gum, no smoking or carbonated beverages. ?Patient may take morning medications with water. ?Come in for test at least 15 minutes early to register. ? ?Your physician has requested that you have a lower extremity arterial duplex. This test is an ultrasound of the arteries in the legs. It looks at arterial blood flow in the legs. Allow one hour for Lower Arterial scans. There are no restrictions or special instructions ? ?Your physician has requested that you have an ankle brachial index (ABI). During this test an ultrasound and blood pressure cuff are used to evaluate the arteries that supply the arms and legs with blood. Allow thirty minutes for this exam. There are no restrictions or special instructions. ?These procedures will be done at [place]. [address] ? ? ?Follow-Up: ?At [facility], you and your health needs are our priority.  As part of our continuing mission to provide you with exceptional heart care, we have created designated Provider Care Teams.  These Care Teams include your primary Cardiologist (physician) and Advanced Practice Providers (APPs -  Physician Assistants and Nurse Practitioners) who all work together to provide you with the care you need, when you need it. ? ?We recommend signing up for the patient portal called "MyChart".  Sign up  information is provided on this After Visit Summary.  MyChart is used to connect with patients for Virtual Visits (Telemedicine).  Patients are able to view lab/test results, encounter notes, upcoming appointments, etc.  Non-urgent messages can be sent to your provider as well.   ?To learn more about what you can do with MyChart, go to [email or web address].   ? ?Your next appointment:   ?3-4 week(s) ? ?The format for your next appointment:   ?In Person ? ?Provider:   ?Quay Burow, MD ?

## 2021-10-25 ENCOUNTER — Telehealth (HOSPITAL_BASED_OUTPATIENT_CLINIC_OR_DEPARTMENT_OTHER): Payer: BC Managed Care – PPO | Admitting: Psychiatry

## 2021-10-25 ENCOUNTER — Encounter (HOSPITAL_COMMUNITY): Payer: Self-pay | Admitting: Psychiatry

## 2021-10-25 DIAGNOSIS — F419 Anxiety disorder, unspecified: Secondary | ICD-10-CM | POA: Diagnosis not present

## 2021-10-25 DIAGNOSIS — F330 Major depressive disorder, recurrent, mild: Secondary | ICD-10-CM | POA: Diagnosis not present

## 2021-10-25 MED ORDER — PAROXETINE HCL 30 MG PO TABS: 30.0000 mg | ORAL_TABLET | Freq: Every day | ORAL | 2 refills | Status: DC

## 2021-10-25 MED ORDER — BUPROPION HCL ER (XL) 150 MG PO TB24: 150.0000 mg | ORAL_TABLET | Freq: Every day | ORAL | 2 refills | Status: DC

## 2021-10-25 NOTE — Progress Notes (Signed)
Virtual Visit via Telephone Note ? ?I connected with Allison [name] on 10/25/21 at  1:30 PM EDT by telephone and verified that I am speaking with the correct person using two identifiers. ? ?Location: ?Patient: Home ?Provider: Home Office ?  ?I discussed the limitations, risks, security and privacy concerns of performing an evaluation and management service by telephone and the availability of in person appointments. I also discussed with the patient that there may be a patient responsible charge related to this service. The patient expressed understanding and agreed to proceed. ? ? ?History of Present Illness: ?Patient is evaluated by phone session.  She is taking Paxil and Wellbutrin.  She reported her anxiety depression is a stable.  She denies any recent paranoia, agitation, anger, crying spells.  She uses CPAP which helps her sleep.  She is relieved as able to find her medicine for eczema.  Recently she had a visit with her cardiologist and primary care physician.  Her hemoglobin A1c remains 7.4.  She is trying but admitted it is difficult because she has a lot of sugar craving.  She lives with her husband and 2 cats.  Patient see Dr. Clayborne Dana at [facility].  She is also taking pain medication.  Recently she had U-Tox and positive for ecstasy, opiates.  Patient not sure why U-Tox shows positive ecstasy because she has never taken ecstasy.  She does not use any illegal substances.  Patient will contact her physician to address this issue.  She has upcoming appointment with Dr. Posey Pronto who is her endocrinologist.  She has no tremor or shakes or any EPS.  She denies any hallucination or any paranoia.  She like to keep the current medication. ? ? ?Past Psychiatric History: Reviewed. ?H/O mood swing, anger and irritability. No h/o suicidal attempt or inpatient treatment.  Seeing psychiatrist since 2003. Tried Risperdal but stopped after feeling better. We tried Lamictal caused itching, Abilify and  Rexulti cannot afford.  ? ?Psychiatric Specialty Exam: ?Physical Exam  ?Review of Systems  ?Weight (!) 346 lb (156.9 kg).Body mass index is 63.28 kg/m?.  ?General Appearance: NA  ?Eye Contact:  NA  ?Speech:  Slow  ?Volume:  Normal  ?Mood:  Euthymic  ?Affect:  NA  ?Thought Process:  Goal Directed  ?Orientation:  Full (Time, Place, and Person)  ?Thought Content:  WDL  ?Suicidal Thoughts:  No  ?Homicidal Thoughts:  No  ?Memory:  Immediate;   Good ?Recent;   Fair ?Remote;   Fair  ?Judgement:  Intact  ?Insight:  Present  ?Psychomotor Activity:  NA  ?Concentration:  Concentration: Fair and Attention Span: Fair  ?Recall:  Good  ?Fund of Knowledge:  Good  ?Language:  Good  ?Akathisia:  No  ?Handed:  Right  ?AIMS (if indicated):     ?Assets:  Communication Skills ?Desire for Improvement ?Housing ?Social Support ?Transportation  ?ADL's:  Intact  ?Cognition:  WNL  ?Sleep:   ok with CPAP  ? ? ? ? ?Assessment and Plan: ?Major depressive disorder, recurrent.  Anxiety. ? ?I review U-Tox which was done by PCP that shows positive for ecstasy.  Patient will address this issue since she has not used ecstasy ever.  She like to keep the Paxil and Wellbutrin.  She feels her anxiety depression is manageable.  Patient has no side effects.  Continue Wellbutrin XL 150 mg in the morning and Paxil 40 mg daily.  Recommended to call us back if she has any question or any concern.  Encourage walking, watching her calorie intake.  Her blood sugar is increased from the past and she is hoping next reading will be better.  Follow-up in 3 months. ? ?Follow Up Instructions: ? ?  ?I discussed the assessment and treatment plan with the patient. The patient was provided an opportunity to ask questions and all were answered. The patient agreed with the plan and demonstrated an understanding of the instructions. ?  ?The patient was advised to call back or seek an in-person evaluation if the symptoms worsen or if the condition fails to improve as  anticipated. ? ?Collaboration of Care: Other provider involved in patient's care AEB notes are available in epic to review. ? ?Patient/Guardian was advised Release of Information must be obtained prior to any record release in order to collaborate their care with an outside provider. Patient/Guardian was advised if they have not already done so to contact the registration department to sign all necessary forms in order for [place] to release information regarding their care.  ? ?Consent: Patient/Guardian gives verbal consent for treatment and assignment of benefits for services provided during this visit. Patient/Guardian expressed understanding and agreed to proceed.   ? ?I provided 24 minutes of non-face-to-face time during this encounter. ? ? ?Kathlee Nations, MD  ?

## 2021-10-26 ENCOUNTER — Other Ambulatory Visit: Payer: Self-pay | Admitting: Cardiovascular Disease

## 2021-10-26 ENCOUNTER — Other Ambulatory Visit (HOSPITAL_COMMUNITY): Payer: Self-pay | Admitting: Cardiovascular Disease

## 2021-10-26 DIAGNOSIS — I2510 Atherosclerotic heart disease of native coronary artery without angina pectoris: Secondary | ICD-10-CM

## 2021-10-27 ENCOUNTER — Encounter (HOSPITAL_COMMUNITY): Payer: Self-pay | Admitting: Emergency Medicine

## 2021-11-01 ENCOUNTER — Ambulatory Visit: Payer: BC Managed Care – PPO | Admitting: Cardiovascular Disease

## 2021-11-08 ENCOUNTER — Ambulatory Visit (HOSPITAL_COMMUNITY)
Admission: RE | Admit: 2021-11-08 | Discharge: 2021-11-08 | Disposition: A | Discharge status: Home / Self Care | Payer: BC Managed Care – PPO | Source: Ambulatory Visit | Attending: Cardiology | Admitting: Cardiology

## 2021-11-08 ENCOUNTER — Ambulatory Visit (HOSPITAL_BASED_OUTPATIENT_CLINIC_OR_DEPARTMENT_OTHER)
Admission: RE | Admit: 2021-11-08 | Discharge: 2021-11-08 | Disposition: A | Discharge status: Home / Self Care | Payer: BC Managed Care – PPO | Source: Ambulatory Visit | Attending: Cardiovascular Disease | Admitting: Cardiovascular Disease

## 2021-11-08 DIAGNOSIS — I739 Peripheral vascular disease, unspecified: Secondary | ICD-10-CM

## 2021-11-08 DIAGNOSIS — Z95828 Presence of other vascular implants and grafts: Secondary | ICD-10-CM

## 2021-11-17 ENCOUNTER — Telehealth (HOSPITAL_COMMUNITY): Payer: Self-pay | Admitting: Emergency Medicine

## 2021-11-17 NOTE — Telephone Encounter (Signed)
Attempted to call patient regarding upcoming cardiac CT appointment. °Left message on voicemail with name and callback number °Pennie Vanblarcom RN Navigator Cardiac Imaging °[facility] °[phone] Office °[phone] Cell ° °

## 2021-11-21 ENCOUNTER — Ambulatory Visit (HOSPITAL_COMMUNITY)
Admission: RE | Admit: 2021-11-21 | Discharge: 2021-11-21 | Disposition: A | Discharge status: Home / Self Care | Payer: BC Managed Care – PPO | Source: Ambulatory Visit | Attending: Family Medicine | Admitting: Family Medicine

## 2021-11-21 DIAGNOSIS — I2510 Atherosclerotic heart disease of native coronary artery without angina pectoris: Secondary | ICD-10-CM | POA: Diagnosis present

## 2021-11-21 LAB — POCT I-STAT CREATININE: Creatinine, Ser: 0.8 mg/dL (ref 0.44–1.00)

## 2021-11-21 MED ORDER — DILTIAZEM HCL 25 MG/5ML IV SOLN
5.0000 mg | INTRAVENOUS | Status: DC | PRN
  Administered 2021-11-21 (×2): 5 mg via INTRAVENOUS

## 2021-11-21 MED ORDER — NITROGLYCERIN 0.4 MG SL SUBL
SUBLINGUAL_TABLET | SUBLINGUAL | Status: DC
Start: 2021-11-21 — End: 2021-11-22
  Filled 2021-11-21: qty 2

## 2021-11-21 MED ORDER — NITROGLYCERIN 0.4 MG SL SUBL
0.8000 mg | SUBLINGUAL_TABLET | Freq: Once | SUBLINGUAL | Status: AC
  Administered 2021-11-21: 0.8 mg via SUBLINGUAL

## 2021-11-21 MED ORDER — DILTIAZEM HCL 25 MG/5ML IV SOLN
INTRAVENOUS | Status: AC
  Filled 2021-11-21: qty 5

## 2021-11-21 MED ORDER — DILTIAZEM HCL 25 MG/5ML IV SOLN: 5.0000 mg | Freq: Once | INTRAVENOUS | Status: DC

## 2021-11-21 NOTE — Progress Notes (Signed)
Pt came in for cardiac CT. On monitor patients heart rate was elevated in the 80s. Pt stated allergy to metoprolol that causes eye swelling. Cardizem, 5mg  given. After medication, heart rate came down into the low 70's and high 60's with a breath hold. CT notified and pt was taken to scan. When pt was on scanner, heart rate increased into the 80's-90's. Another 5mg  of cardizem given and nitroglycerin was also given. Heart rate was high 70's to the 80's. Dr. notified and stated due to patients BMI and heart rate staying elevated, pt would need to be rescheduled with different pre-medications. CT tech/supervisor was notified who notified the cardiac CT navigators. Pt informed that we are unable to complete the scan and was informed someone would reach out to her to reschedule the scan. Pt was understanding of this. Pt taken back to husband and then wheeled out in wheelchair to the drop off/ pick up area.

## 2021-11-22 MED ORDER — IOHEXOL 350 MG/ML SOLN
100.0000 mL | Freq: Once | INTRAVENOUS | Status: AC | PRN
  Administered 2021-11-21: 100 mL via INTRAVENOUS

## 2021-12-19 ENCOUNTER — Encounter: Payer: Self-pay | Admitting: Cardiovascular Disease

## 2021-12-19 ENCOUNTER — Ambulatory Visit (INDEPENDENT_AMBULATORY_CARE_PROVIDER_SITE_OTHER): Payer: BC Managed Care – PPO | Admitting: Cardiovascular Disease

## 2021-12-19 DIAGNOSIS — R079 Chest pain, unspecified: Secondary | ICD-10-CM | POA: Diagnosis not present

## 2021-12-19 DIAGNOSIS — E782 Mixed hyperlipidemia: Secondary | ICD-10-CM | POA: Diagnosis not present

## 2021-12-19 DIAGNOSIS — I739 Peripheral vascular disease, unspecified: Secondary | ICD-10-CM

## 2021-12-19 DIAGNOSIS — R030 Elevated blood-pressure reading, without diagnosis of hypertension: Secondary | ICD-10-CM | POA: Diagnosis not present

## 2021-12-19 MED ORDER — HYDRALAZINE HCL 50 MG PO TABS: 50.0000 mg | ORAL_TABLET | Freq: Three times a day (TID) | ORAL | 1 refills | Status: DC

## 2021-12-19 MED ORDER — NEBIVOLOL HCL 5 MG PO TABS: 5.0000 mg | ORAL_TABLET | Freq: Every day | ORAL | 1 refills | Status: DC

## 2021-12-19 NOTE — H&P (View-Only) (Signed)
12/19/2021 Allison [name]   Sep 27, 1960  [id number]  Primary [facility], [facility] Primary Cardiologist: Runell Gess MD Milagros Loll, [place], [place]  HPI:  Allison [name] is a 61 y.o.  morbidly overweight married Caucasian female mother of 1, grandmother of 1 grandchild who currently does not work and was referred to me by Dr. Hanley Hays, her cardiologist, for peripheral vascular evaluation.  I last saw her in the office 10/13/2021.  She stopped smoking in 2012 at the time of her cardiac catheterization.  She does have treated hypertension, hyperlipidemia and diabetes.  I stented her right extrailiac artery in 2012.  That time her left lower extremity was free of disease.  She currently complains of left lower extremity claudication.  She is also had accelerating angina which is nitrate responsive the last month or 2.  She had COVID back in September.  She also has morbid obesity.  I performed lower extremity arterial Doppler studies on her 11/08/2021 which revealed ABIs of 0.6-0.7 range.  She did have patent extrailiac arteries with monophasic waveforms.  They were not able to interrogate the distal abdominal aorta and common iliac arteries because of her body habitus.  She also has complained of daily chest pain.  We attempted to do a coronary CTA which could not be performed because of tachycardia and PVCs.   Current Meds  Medication Sig   albuterol (PROVENTIL HFA;VENTOLIN HFA) 108 (90 BASE) MCG/ACT inhaler Inhale 2 puffs into the lungs every 6 (six) hours as needed for wheezing or shortness of breath.   amLODipine (NORVASC) 5 MG tablet Take 5 mg by mouth daily.   aspirin EC 81 MG tablet Take 81 mg by mouth every other day.   atorvastatin (LIPITOR) 10 MG tablet Take 10 mg by mouth daily.   buPROPion (WELLBUTRIN XL) 150 MG 24 hr tablet Take 1 tablet (150 mg total) by mouth daily.   Choline Fenofibrate 135 MG capsule Take 135 mg by mouth daily.     cilostazol (PLETAL) 100 MG  tablet Take 100 mg by mouth 2 (two) times daily.   clopidogrel (PLAVIX) 75 MG tablet Take 75 mg by mouth daily.     HYDROcodone-acetaminophen (NORCO) 10-325 MG tablet TAKE 1 TABLET BY MOUTH TWICE DAILY AS NEEDED FOR SEVERE PAIN   hydrOXYzine (ATARAX/VISTARIL) 25 MG tablet Take 25 mg by mouth every 6 (six) hours as needed for itching.   Insulin Glargine (BASAGLAR KWIKPEN) 100 UNIT/ML SMARTSIG:90 Unit(s) SUB-Q Every Night   isosorbide mononitrate (IMDUR) 60 MG 24 hr tablet Take 60 mg by mouth daily.   JARDIANCE 25 MG TABS tablet Take 25 mg by mouth daily.   lubiprostone (AMITIZA) 8 MCG capsule Take 8 mcg by mouth 2 (two) times daily with a meal.   Magnesium Oxide 250 MG TABS Take 1 tablet by mouth daily.   meloxicam (MOBIC) 15 MG tablet TAKE 1 TABLET BY MOUTH IN THE MORNING AS NEEDED WITH FOOD   methocarbamol (ROBAXIN) 500 MG tablet Take 1 tablet (500 mg total) by mouth every 8 (eight) hours as needed for muscle spasms.   nitroGLYCERIN (NITROSTAT) 0.4 MG SL tablet PLACE 1 TONGUE UNDER TONGUE EVERY 5 MINUTES IF NEEDED FOR CHEST PAIN   NOVOLOG 100 UNIT/ML injection 40 Units in the morning, at noon, and at bedtime.   Olmesartan-amLODIPine-HCTZ 40-5-25 MG TABS Take 1 tablet by mouth daily.    PARoxetine (PAXIL) 30 MG tablet Take 1 tablet (30 mg total) by mouth daily.  potassium chloride SA (K-DUR,KLOR-CON) 20 MEQ tablet Take 20 mEq by mouth daily.   ranolazine (RANEXA) 1000 MG SR tablet Take 500 mg by mouth 2 (two) times daily.   Semaglutide,0.25 or 0.5MG /DOS, 2 MG/1.5ML SOPN Inject 0.25 mg into the skin.   TREMFYA 100 MG/ML SOPN Inject into the skin.   Vitamin D, Ergocalciferol, (DRISDOL) 50000 UNITS CAPS capsule Take 50,000 Units by mouth every 7 (seven) days.   [DISCONTINUED] metoprolol tartrate (LOPRESSOR) 50 MG tablet Take 50 mg by mouth 2 (two) times daily.     Allergies  Allergen Reactions   Sulfonamide Derivatives Anaphylaxis   Aspirin Nausea And Vomiting   Metoprolol-Hctz Er Swelling     Eyes swell   Penicillins Hives   Latex Rash    Social History   Socioeconomic History   Marital status: Married    Spouse name: Not on file   Number of children: Not on file   Years of education: Not on file   Highest education level: Not on file  Occupational History   Not on file  Tobacco Use   Smoking status: Former    Types: Cigarettes    Quit date: 12/23/2009    Years since quitting: 11.9   Smokeless tobacco: Never  Vaping Use   Vaping Use: Never used  Substance and Sexual Activity   Alcohol use: No    Alcohol/week: 0.0 standard drinks of alcohol   Drug use: No   Sexual activity: Not on file  Other Topics Concern   Not on file  Social History Narrative   Not on file   Social Determinants of Health   Financial Resource Strain: Not on file  Food Insecurity: Not on file  Transportation Needs: Not on file  Physical Activity: Not on file  Stress: Not on file  Social Connections: Not on file  Intimate Partner Violence: Not on file     Review of Systems: General: negative for chills, fever, night sweats or weight changes.  Cardiovascular: negative for chest pain, dyspnea on exertion, edema, orthopnea, palpitations, paroxysmal nocturnal dyspnea or shortness of breath Dermatological: negative for rash Respiratory: negative for cough or wheezing Urologic: negative for hematuria Abdominal: negative for nausea, vomiting, diarrhea, bright red blood per rectum, melena, or hematemesis Neurologic: negative for visual changes, syncope, or dizziness All other systems reviewed and are otherwise negative except as noted above.    Blood pressure (!) 228/70, pulse 89, height 5\' 2"  (1.575 m), weight (!) 351 lb (159.2 kg).  General appearance: alert and no distress Neck: no adenopathy, no JVD, supple, symmetrical, trachea midline, thyroid not enlarged, symmetric, no tenderness/mass/nodules, and soft bilateral carotid bruits Lungs: clear to auscultation bilaterally Heart:  regular rate and rhythm, S1, S2 normal, no murmur, click, rub or gallop Extremities: extremities normal, atraumatic, no cyanosis or edema Pulses: 2+ and symmetric Skin: Skin color, texture, turgor normal. No rashes or lesions Neurologic: Grossly normal  EKG sinus tachycardia 89 with nonspecific ST and T wave changes.  Personally reviewed this EKG.  ASSESSMENT AND PLAN:   HYPERTENSION, WHITE COAT History of poorly controlled hypertension a blood pressure measured today at 228/70.  She is on amlodipine, olmesartan, hydrochlorothiazide.  I am going to begin her on hydralazine 25 mg p.o. 3 times daily.  Peripheral arterial disease ([facility]) History of peripheral arterial disease status post right common iliac artery stenting by myself back in 2012.  She does complain of pain in both legs and had Doppler studies performed 11/08/2021 revealing patent external iliac arteries  with monophasic waveforms.  They could not interrogate her distal abdominal aorta and common iliac arteries because of body habitus.  I am going to get an abdominal CTA to further evaluate.  Hyperlipidemia History of hyperlipidemia on statin therapy followed by her PCP  Chest pain of uncertain etiology Ms. Seevers is complaining of chest pain on a daily basis.  She does have significant coronary risk factors.  We had attempted coronary CTA on her which unfortunately could not be done because of tachycardia and PVCs.  I am going to repeat this with intravenous beta-blocker to see if we can get adequate data as opposed to performing coronary angiography.     Runell Gess MD FACP,FACC,FAHA, [facility] 12/19/2021 12:13 PM

## 2021-12-19 NOTE — Progress Notes (Signed)
12/19/2021 Allison [name]   Sep 27, 1960  [id number]  Primary [facility], [facility] Primary Cardiologist: Runell Gess MD Milagros Loll, [place], [place]  HPI:  Allison [name] is a 61 y.o.  morbidly overweight married Caucasian female mother of 1, grandmother of 1 grandchild who currently does not work and was referred to me by Dr. Hanley Hays, her cardiologist, for peripheral vascular evaluation.  I last saw her in the office 10/13/2021.  She stopped smoking in 2012 at the time of her cardiac catheterization.  She does have treated hypertension, hyperlipidemia and diabetes.  I stented her right extrailiac artery in 2012.  That time her left lower extremity was free of disease.  She currently complains of left lower extremity claudication.  She is also had accelerating angina which is nitrate responsive the last month or 2.  She had COVID back in September.  She also has morbid obesity.  I performed lower extremity arterial Doppler studies on her 11/08/2021 which revealed ABIs of 0.6-0.7 range.  She did have patent extrailiac arteries with monophasic waveforms.  They were not able to interrogate the distal abdominal aorta and common iliac arteries because of her body habitus.  She also has complained of daily chest pain.  We attempted to do a coronary CTA which could not be performed because of tachycardia and PVCs.   Current Meds  Medication Sig   albuterol (PROVENTIL HFA;VENTOLIN HFA) 108 (90 BASE) MCG/ACT inhaler Inhale 2 puffs into the lungs every 6 (six) hours as needed for wheezing or shortness of breath.   amLODipine (NORVASC) 5 MG tablet Take 5 mg by mouth daily.   aspirin EC 81 MG tablet Take 81 mg by mouth every other day.   atorvastatin (LIPITOR) 10 MG tablet Take 10 mg by mouth daily.   buPROPion (WELLBUTRIN XL) 150 MG 24 hr tablet Take 1 tablet (150 mg total) by mouth daily.   Choline Fenofibrate 135 MG capsule Take 135 mg by mouth daily.     cilostazol (PLETAL) 100 MG  tablet Take 100 mg by mouth 2 (two) times daily.   clopidogrel (PLAVIX) 75 MG tablet Take 75 mg by mouth daily.     HYDROcodone-acetaminophen (NORCO) 10-325 MG tablet TAKE 1 TABLET BY MOUTH TWICE DAILY AS NEEDED FOR SEVERE PAIN   hydrOXYzine (ATARAX/VISTARIL) 25 MG tablet Take 25 mg by mouth every 6 (six) hours as needed for itching.   Insulin Glargine (BASAGLAR KWIKPEN) 100 UNIT/ML SMARTSIG:90 Unit(s) SUB-Q Every Night   isosorbide mononitrate (IMDUR) 60 MG 24 hr tablet Take 60 mg by mouth daily.   JARDIANCE 25 MG TABS tablet Take 25 mg by mouth daily.   lubiprostone (AMITIZA) 8 MCG capsule Take 8 mcg by mouth 2 (two) times daily with a meal.   Magnesium Oxide 250 MG TABS Take 1 tablet by mouth daily.   meloxicam (MOBIC) 15 MG tablet TAKE 1 TABLET BY MOUTH IN THE MORNING AS NEEDED WITH FOOD   methocarbamol (ROBAXIN) 500 MG tablet Take 1 tablet (500 mg total) by mouth every 8 (eight) hours as needed for muscle spasms.   nitroGLYCERIN (NITROSTAT) 0.4 MG SL tablet PLACE 1 TONGUE UNDER TONGUE EVERY 5 MINUTES IF NEEDED FOR CHEST PAIN   NOVOLOG 100 UNIT/ML injection 40 Units in the morning, at noon, and at bedtime.   Olmesartan-amLODIPine-HCTZ 40-5-25 MG TABS Take 1 tablet by mouth daily.    PARoxetine (PAXIL) 30 MG tablet Take 1 tablet (30 mg total) by mouth daily.  potassium chloride SA (K-DUR,KLOR-CON) 20 MEQ tablet Take 20 mEq by mouth daily.   ranolazine (RANEXA) 1000 MG SR tablet Take 500 mg by mouth 2 (two) times daily.   Semaglutide,0.25 or 0.5MG /DOS, 2 MG/1.5ML SOPN Inject 0.25 mg into the skin.   TREMFYA 100 MG/ML SOPN Inject into the skin.   Vitamin D, Ergocalciferol, (DRISDOL) 50000 UNITS CAPS capsule Take 50,000 Units by mouth every 7 (seven) days.   [DISCONTINUED] metoprolol tartrate (LOPRESSOR) 50 MG tablet Take 50 mg by mouth 2 (two) times daily.     Allergies  Allergen Reactions   Sulfonamide Derivatives Anaphylaxis   Aspirin Nausea And Vomiting   Metoprolol-Hctz Er Swelling     Eyes swell   Penicillins Hives   Latex Rash    Social History   Socioeconomic History   Marital status: Married    Spouse name: Not on file   Number of children: Not on file   Years of education: Not on file   Highest education level: Not on file  Occupational History   Not on file  Tobacco Use   Smoking status: Former    Types: Cigarettes    Quit date: 12/23/2009    Years since quitting: 11.9   Smokeless tobacco: Never  Vaping Use   Vaping Use: Never used  Substance and Sexual Activity   Alcohol use: No    Alcohol/week: 0.0 standard drinks of alcohol   Drug use: No   Sexual activity: Not on file  Other Topics Concern   Not on file  Social History Narrative   Not on file   Social Determinants of Health   Financial Resource Strain: Not on file  Food Insecurity: Not on file  Transportation Needs: Not on file  Physical Activity: Not on file  Stress: Not on file  Social Connections: Not on file  Intimate Partner Violence: Not on file     Review of Systems: General: negative for chills, fever, night sweats or weight changes.  Cardiovascular: negative for chest pain, dyspnea on exertion, edema, orthopnea, palpitations, paroxysmal nocturnal dyspnea or shortness of breath Dermatological: negative for rash Respiratory: negative for cough or wheezing Urologic: negative for hematuria Abdominal: negative for nausea, vomiting, diarrhea, bright red blood per rectum, melena, or hematemesis Neurologic: negative for visual changes, syncope, or dizziness All other systems reviewed and are otherwise negative except as noted above.    Blood pressure (!) 228/70, pulse 89, height 5\' 2"  (1.575 m), weight (!) 351 lb (159.2 kg).  General appearance: alert and no distress Neck: no adenopathy, no JVD, supple, symmetrical, trachea midline, thyroid not enlarged, symmetric, no tenderness/mass/nodules, and soft bilateral carotid bruits Lungs: clear to auscultation bilaterally Heart:  regular rate and rhythm, S1, S2 normal, no murmur, click, rub or gallop Extremities: extremities normal, atraumatic, no cyanosis or edema Pulses: 2+ and symmetric Skin: Skin color, texture, turgor normal. No rashes or lesions Neurologic: Grossly normal  EKG sinus tachycardia 89 with nonspecific ST and T wave changes.  Personally reviewed this EKG.  ASSESSMENT AND PLAN:   HYPERTENSION, WHITE COAT History of poorly controlled hypertension a blood pressure measured today at 228/70.  She is on amlodipine, olmesartan, hydrochlorothiazide.  I am going to begin her on hydralazine 25 mg p.o. 3 times daily.  Peripheral arterial disease (HCC) History of peripheral arterial disease status post right common iliac artery stenting by myself back in 2012.  She does complain of pain in both legs and had Doppler studies performed 11/08/2021 revealing patent external iliac arteries  with monophasic waveforms.  They could not interrogate her distal abdominal aorta and common iliac arteries because of body habitus.  I am going to get an abdominal CTA to further evaluate.  Hyperlipidemia History of hyperlipidemia on statin therapy followed by her PCP  Chest pain of uncertain etiology Ms. Seevers is complaining of chest pain on a daily basis.  She does have significant coronary risk factors.  We had attempted coronary CTA on her which unfortunately could not be done because of tachycardia and PVCs.  I am going to repeat this with intravenous beta-blocker to see if we can get adequate data as opposed to performing coronary angiography.     Runell Gess MD FACP,FACC,FAHA, [facility] 12/19/2021 12:13 PM

## 2021-12-19 NOTE — Assessment & Plan Note (Signed)
History of hyperlipidemia on statin therapy followed by her PCP. 

## 2021-12-20 ENCOUNTER — Telehealth: Payer: Self-pay

## 2021-12-20 LAB — BASIC METABOLIC PANEL WITH GFR
BUN/Creatinine Ratio: 20 (ref 12–28)
BUN: 17 mg/dL (ref 8–27)
CO2: 24 mmol/L (ref 20–29)
Calcium: 9.6 mg/dL (ref 8.7–10.3)
Chloride: 103 mmol/L (ref 96–106)
Creatinine, Ser: 0.83 mg/dL (ref 0.57–1.00)
Glucose: 112 mg/dL — ABNORMAL HIGH (ref 70–99)
Potassium: 4.1 mmol/L (ref 3.5–5.2)
Sodium: 142 mmol/L (ref 134–144)
eGFR: 81 mL/min/1.73

## 2021-12-20 NOTE — Telephone Encounter (Signed)
Spoke with pt regarding possible heart cath that needs to be done in place of coronary CTA that couldn't be completed. Pt is going to talk to her daughter and husband to work out a date when she can do cath. She requests for me to call her back on Monday. I will call pt on 7/3 to finalize details regarding heart cath. Pt verbalizes understanding.

## 2021-12-21 ENCOUNTER — Encounter: Payer: Self-pay | Admitting: *Deleted

## 2021-12-22 NOTE — Telephone Encounter (Signed)
Pt scheduled for heart cath on 7/6 at 1500. The following instructions were reviewed with the pt. Pt verbalizes understanding and will call us back with any questions.   [facility] CARDIOVASCULAR DIVISION [facility] NORTHLINE [address] [place] [address] Dept: [phone] Loc: [phone]  Allison [name]  12/22/2021  You are scheduled for a Cardiac Catheterization on Thursday, July 6 with Dr. Nanetta Batty.  1. Please arrive at the Main Entrance A at [facility]: [address] [place], [place] [address] at 1:00 PM (This time is two hours before your procedure to ensure your preparation). Free valet parking service is available.   Special note: Every effort is made to have your procedure done on time. Please understand that emergencies sometimes delay scheduled procedures.  2. Diet: Do not eat solid foods after midnight.  You may have clear liquids until 5 AM upon the day of the procedure.  3. Labs: You will need to have blood drawn- done  4. Medication instructions in preparation for your procedure:    Stop taking, HTCZ (Hydrochlorothiazide) Thursday, July 6,  Take only 45 units of insulin the night before your procedure. Do not take any insulin on the day of the procedure.  On the morning of your procedure, take Aspirin and Plavix/Clopidogrel and any morning medicines NOT listed above.  You may use sips of water.  5. Plan to go home the same day, you will only stay overnight if medically necessary. 6. You MUST have a responsible adult to drive you home. 7. An adult MUST be with you the first 24 hours after you arrive home. 8. Bring a current list of your medications, and the last time and date medication taken. 9. Bring ID and current insurance cards. 10.Please wear clothes that are easy to get on and off and wear slip-on shoes.  Thank you for allowing [place] to care for you!   -- [facility]

## 2021-12-22 NOTE — Telephone Encounter (Signed)
Pt returning a call and she asked if she can do the cath on 12/28/21

## 2021-12-25 ENCOUNTER — Other Ambulatory Visit: Payer: Self-pay

## 2021-12-25 DIAGNOSIS — R079 Chest pain, unspecified: Secondary | ICD-10-CM

## 2021-12-25 MED ORDER — SODIUM CHLORIDE 0.9% FLUSH: 3.0000 mL | Freq: Two times a day (BID) | INTRAVENOUS | Status: DC

## 2021-12-25 NOTE — Telephone Encounter (Signed)
Labs received and scanned to pt's chart. Labs can be found under media tab.

## 2021-12-25 NOTE — Telephone Encounter (Signed)
Per pt recent lab work done at [facility]. Spoke with [employer] [facility], they are faxing a copy of lab work.

## 2021-12-27 ENCOUNTER — Telehealth: Payer: Self-pay | Admitting: *Deleted

## 2021-12-27 NOTE — Telephone Encounter (Signed)
Cardiac Catheterization scheduled at [facility] for: Thursday December 28, 2021 3 PM Arrival time and place: [facility] Main Entrance A at: 1 PM   Nothing to eat after midnight prior to procedure, clear liquids until 5 AM day of procedure.  Medication instructions: -Hold:  Insulin/Jardiance-AM of procedure  HCTZ/KCl- AM of procedure -Except hold medications usual morning medications can be taken with sips of water including aspirin 81 mg and Plavix 75 mg.  Patient reports she can take and tolerate aspirin 81 mg.  Confirmed patient has responsible adult to drive home post procedure and be with patient first 24 hours after arriving home.  Patient reports no new symptoms concerning for COVID-19 in the past 10 days.  Reviewed procedure instructions with patient.

## 2021-12-28 ENCOUNTER — Ambulatory Visit (HOSPITAL_COMMUNITY)
Admission: RE | Admit: 2021-12-28 | Discharge: 2021-12-29 | Disposition: A | Discharge status: Home / Self Care | Payer: BC Managed Care – PPO | Source: Ambulatory Visit | Attending: Cardiovascular Disease | Admitting: Cardiovascular Disease

## 2021-12-28 ENCOUNTER — Other Ambulatory Visit: Payer: Self-pay

## 2021-12-28 ENCOUNTER — Encounter (HOSPITAL_COMMUNITY)
Admission: RE | Disposition: A | Discharge status: Home / Self Care | Payer: Self-pay | Source: Ambulatory Visit | Attending: Cardiovascular Disease

## 2021-12-28 DIAGNOSIS — I25110 Atherosclerotic heart disease of native coronary artery with unstable angina pectoris: Secondary | ICD-10-CM | POA: Diagnosis not present

## 2021-12-28 DIAGNOSIS — Z955 Presence of coronary angioplasty implant and graft: Secondary | ICD-10-CM

## 2021-12-28 DIAGNOSIS — Z87891 Personal history of nicotine dependence: Secondary | ICD-10-CM | POA: Diagnosis not present

## 2021-12-28 DIAGNOSIS — Z6841 Body Mass Index (BMI) 40.0 and over, adult: Secondary | ICD-10-CM | POA: Diagnosis not present

## 2021-12-28 DIAGNOSIS — R079 Chest pain, unspecified: Secondary | ICD-10-CM

## 2021-12-28 DIAGNOSIS — R030 Elevated blood-pressure reading, without diagnosis of hypertension: Secondary | ICD-10-CM | POA: Diagnosis present

## 2021-12-28 DIAGNOSIS — E119 Type 2 diabetes mellitus without complications: Secondary | ICD-10-CM

## 2021-12-28 DIAGNOSIS — E1151 Type 2 diabetes mellitus with diabetic peripheral angiopathy without gangrene: Secondary | ICD-10-CM | POA: Diagnosis not present

## 2021-12-28 DIAGNOSIS — Z794 Long term (current) use of insulin: Secondary | ICD-10-CM | POA: Diagnosis not present

## 2021-12-28 DIAGNOSIS — I2510 Atherosclerotic heart disease of native coronary artery without angina pectoris: Secondary | ICD-10-CM | POA: Diagnosis present

## 2021-12-28 DIAGNOSIS — Z8616 Personal history of COVID-19: Secondary | ICD-10-CM | POA: Insufficient documentation

## 2021-12-28 DIAGNOSIS — I10 Essential (primary) hypertension: Secondary | ICD-10-CM | POA: Diagnosis not present

## 2021-12-28 DIAGNOSIS — E785 Hyperlipidemia, unspecified: Secondary | ICD-10-CM | POA: Diagnosis not present

## 2021-12-28 HISTORY — PX: INTRAVASCULAR PRESSURE WIRE/FFR STUDY: CATH118243

## 2021-12-28 HISTORY — PX: LEFT HEART CATH AND CORONARY ANGIOGRAPHY: CATH118249

## 2021-12-28 HISTORY — PX: CORONARY STENT INTERVENTION: CATH118234

## 2021-12-28 LAB — CBC
HCT: 40.4 % (ref 36.0–46.0)
Hemoglobin: 12.9 g/dL (ref 12.0–15.0)
MCH: 29.7 pg (ref 26.0–34.0)
MCHC: 31.9 g/dL (ref 30.0–36.0)
MCV: 92.9 fL (ref 80.0–100.0)
Platelets: 310 10*3/uL (ref 150–400)
RBC: 4.35 MIL/uL (ref 3.87–5.11)
RDW: 13.5 % (ref 11.5–15.5)
WBC: 11.7 10*3/uL — ABNORMAL HIGH (ref 4.0–10.5)
nRBC: 0 % (ref 0.0–0.2)

## 2021-12-28 LAB — GLUCOSE, CAPILLARY
Glucose-Capillary: 93 mg/dL (ref 70–99)
Glucose-Capillary: 78 mg/dL (ref 70–99)
Glucose-Capillary: 121 mg/dL — ABNORMAL HIGH (ref 70–99)

## 2021-12-28 LAB — POCT ACTIVATED CLOTTING TIME
Activated Clotting Time: 281 seconds
Activated Clotting Time: 317 seconds

## 2021-12-28 SURGERY — LEFT HEART CATH AND CORONARY ANGIOGRAPHY
Anesthesia: LOCAL

## 2021-12-28 MED ORDER — ASPIRIN 81 MG PO CHEW: 81.0000 mg | CHEWABLE_TABLET | ORAL | Status: DC

## 2021-12-28 MED ORDER — FENTANYL CITRATE (PF) 100 MCG/2ML IJ SOLN
INTRAMUSCULAR | Status: DC | PRN
  Administered 2021-12-28: 25 ug via INTRAVENOUS

## 2021-12-28 MED ORDER — HEPARIN SODIUM (PORCINE) 1000 UNIT/ML IJ SOLN
INTRAMUSCULAR | Status: DC | PRN
  Administered 2021-12-28: 9000 [IU] via INTRAVENOUS
  Administered 2021-12-28: 7000 [IU] via INTRAVENOUS

## 2021-12-28 MED ORDER — POTASSIUM CHLORIDE CRYS ER 20 MEQ PO TBCR
20.0000 meq | EXTENDED_RELEASE_TABLET | Freq: Every morning | ORAL | Status: DC
  Administered 2021-12-29: 20 meq via ORAL
  Filled 2021-12-28: qty 1

## 2021-12-28 MED ORDER — HEPARIN (PORCINE) IN NACL 1000-0.9 UT/500ML-% IV SOLN
INTRAVENOUS | Status: AC
  Filled 2021-12-28: qty 1000

## 2021-12-28 MED ORDER — HEPARIN SODIUM (PORCINE) 1000 UNIT/ML IJ SOLN
INTRAMUSCULAR | Status: AC
  Filled 2021-12-28: qty 10

## 2021-12-28 MED ORDER — AMLODIPINE BESYLATE 5 MG PO TABS: 5.0000 mg | ORAL_TABLET | Freq: Every day | ORAL | Status: DC

## 2021-12-28 MED ORDER — CLOPIDOGREL BISULFATE 300 MG PO TABS
ORAL_TABLET | ORAL | Status: DC | PRN
  Administered 2021-12-28: 300 mg via ORAL

## 2021-12-28 MED ORDER — LABETALOL HCL 5 MG/ML IV SOLN: 10.0000 mg | INTRAVENOUS | Status: AC | PRN

## 2021-12-28 MED ORDER — ASPIRIN 81 MG PO TBEC
81.0000 mg | DELAYED_RELEASE_TABLET | Freq: Every morning | ORAL | Status: DC
  Administered 2021-12-29: 81 mg via ORAL
  Filled 2021-12-28: qty 1

## 2021-12-28 MED ORDER — MORPHINE SULFATE (PF) 2 MG/ML IV SOLN: 2.0000 mg | INTRAVENOUS | Status: DC | PRN

## 2021-12-28 MED ORDER — SODIUM CHLORIDE 0.9% FLUSH: 3.0000 mL | INTRAVENOUS | Status: DC | PRN

## 2021-12-28 MED ORDER — CLOPIDOGREL BISULFATE 75 MG PO TABS
75.0000 mg | ORAL_TABLET | Freq: Every morning | ORAL | Status: DC
  Administered 2021-12-29: 75 mg via ORAL
  Filled 2021-12-28: qty 1

## 2021-12-28 MED ORDER — ACETAMINOPHEN 325 MG PO TABS
ORAL_TABLET | ORAL | Status: AC
  Filled 2021-12-28: qty 2

## 2021-12-28 MED ORDER — EMPAGLIFLOZIN 25 MG PO TABS
25.0000 mg | ORAL_TABLET | Freq: Every morning | ORAL | Status: DC
  Administered 2021-12-29: 25 mg via ORAL
  Filled 2021-12-28: qty 1

## 2021-12-28 MED ORDER — MIDAZOLAM HCL 2 MG/2ML IJ SOLN
INTRAMUSCULAR | Status: DC | PRN
  Administered 2021-12-28: 1 mg via INTRAVENOUS

## 2021-12-28 MED ORDER — MIDAZOLAM HCL 2 MG/2ML IJ SOLN
INTRAMUSCULAR | Status: AC
  Filled 2021-12-28: qty 2

## 2021-12-28 MED ORDER — SODIUM CHLORIDE 0.9 % IV SOLN: INTRAVENOUS | Status: AC

## 2021-12-28 MED ORDER — HYDRALAZINE HCL 20 MG/ML IJ SOLN: 10.0000 mg | INTRAMUSCULAR | Status: AC | PRN

## 2021-12-28 MED ORDER — ACETAMINOPHEN 325 MG PO TABS
650.0000 mg | ORAL_TABLET | ORAL | Status: DC | PRN
  Administered 2021-12-28: 650 mg via ORAL

## 2021-12-28 MED ORDER — OLMESARTAN-AMLODIPINE-HCTZ 40-5-25 MG PO TABS: 1.0000 | ORAL_TABLET | Freq: Every day | ORAL | Status: DC

## 2021-12-28 MED ORDER — IRBESARTAN 150 MG PO TABS
300.0000 mg | ORAL_TABLET | Freq: Every day | ORAL | Status: DC
  Administered 2021-12-28 – 2021-12-29 (×2): 300 mg via ORAL
  Filled 2021-12-28: qty 1
  Filled 2021-12-28 (×2): qty 2

## 2021-12-28 MED ORDER — ALBUTEROL SULFATE HFA 108 (90 BASE) MCG/ACT IN AERS: 2.0000 | INHALATION_SPRAY | Freq: Four times a day (QID) | RESPIRATORY_TRACT | Status: DC | PRN

## 2021-12-28 MED ORDER — CLOPIDOGREL BISULFATE 300 MG PO TABS
ORAL_TABLET | ORAL | Status: AC
  Filled 2021-12-28: qty 1

## 2021-12-28 MED ORDER — IOHEXOL 350 MG/ML SOLN
INTRAVENOUS | Status: DC | PRN
  Administered 2021-12-28: 130 mL

## 2021-12-28 MED ORDER — ISOSORBIDE MONONITRATE ER 60 MG PO TB24
60.0000 mg | ORAL_TABLET | Freq: Every morning | ORAL | Status: DC
  Administered 2021-12-29: 60 mg via ORAL
  Filled 2021-12-28: qty 1

## 2021-12-28 MED ORDER — ALBUTEROL SULFATE (2.5 MG/3ML) 0.083% IN NEBU
2.5000 mg | INHALATION_SOLUTION | Freq: Four times a day (QID) | RESPIRATORY_TRACT | Status: DC | PRN
Start: 2021-12-28 — End: 2021-12-29

## 2021-12-28 MED ORDER — SODIUM CHLORIDE 0.9 % WEIGHT BASED INFUSION
3.0000 mL/kg/h | INTRAVENOUS | Status: DC
  Administered 2021-12-28: 3 mL/kg/h via INTRAVENOUS

## 2021-12-28 MED ORDER — HYDRALAZINE HCL 50 MG PO TABS
50.0000 mg | ORAL_TABLET | Freq: Three times a day (TID) | ORAL | Status: DC
  Administered 2021-12-28 – 2021-12-29 (×2): 50 mg via ORAL
  Filled 2021-12-28 (×2): qty 1

## 2021-12-28 MED ORDER — ASPIRIN 81 MG PO CHEW: 81.0000 mg | CHEWABLE_TABLET | Freq: Every day | ORAL | Status: DC

## 2021-12-28 MED ORDER — SODIUM CHLORIDE 0.9 % IV SOLN: 250.0000 mL | INTRAVENOUS | Status: DC | PRN

## 2021-12-28 MED ORDER — VERAPAMIL HCL 2.5 MG/ML IV SOLN
INTRAVENOUS | Status: AC
  Filled 2021-12-28: qty 2

## 2021-12-28 MED ORDER — NEBIVOLOL HCL 5 MG PO TABS
5.0000 mg | ORAL_TABLET | Freq: Every day | ORAL | Status: DC
  Administered 2021-12-29: 5 mg via ORAL
  Filled 2021-12-28: qty 1

## 2021-12-28 MED ORDER — SODIUM CHLORIDE 0.9% FLUSH
3.0000 mL | Freq: Two times a day (BID) | INTRAVENOUS | Status: DC
  Administered 2021-12-28 – 2021-12-29 (×2): 3 mL via INTRAVENOUS

## 2021-12-28 MED ORDER — LIDOCAINE HCL (PF) 1 % IJ SOLN
INTRAMUSCULAR | Status: AC
  Filled 2021-12-28: qty 30

## 2021-12-28 MED ORDER — LIDOCAINE HCL (PF) 1 % IJ SOLN
INTRAMUSCULAR | Status: DC | PRN
  Administered 2021-12-28: 2 mL via SUBCUTANEOUS

## 2021-12-28 MED ORDER — INSULIN ASPART 100 UNIT/ML IJ SOLN
0.0000 [IU] | Freq: Three times a day (TID) | INTRAMUSCULAR | Status: DC
  Administered 2021-12-29: 2 [IU] via SUBCUTANEOUS

## 2021-12-28 MED ORDER — SODIUM CHLORIDE 0.9 % WEIGHT BASED INFUSION: 1.0000 mL/kg/h | INTRAVENOUS | Status: DC

## 2021-12-28 MED ORDER — FENTANYL CITRATE (PF) 100 MCG/2ML IJ SOLN
INTRAMUSCULAR | Status: AC
  Filled 2021-12-28: qty 2

## 2021-12-28 MED ORDER — INSULIN ASPART 100 UNIT/ML IJ SOLN: 0.0000 [IU] | Freq: Every day | INTRAMUSCULAR | Status: DC

## 2021-12-28 MED ORDER — VERAPAMIL HCL 2.5 MG/ML IV SOLN
INTRA_ARTERIAL | Status: DC | PRN
  Administered 2021-12-28: 10 mL via INTRA_ARTERIAL

## 2021-12-28 MED ORDER — HYDROCHLOROTHIAZIDE 25 MG PO TABS
25.0000 mg | ORAL_TABLET | Freq: Every day | ORAL | Status: DC
  Administered 2021-12-28 – 2021-12-29 (×2): 25 mg via ORAL
  Filled 2021-12-28 (×3): qty 1

## 2021-12-28 MED ORDER — NITROGLYCERIN 1 MG/10 ML FOR IR/CATH LAB
INTRA_ARTERIAL | Status: AC
Start: 2021-12-28 — End: ?
  Filled 2021-12-28: qty 10

## 2021-12-28 MED ORDER — ATORVASTATIN CALCIUM 40 MG PO TABS
80.0000 mg | ORAL_TABLET | Freq: Every day | ORAL | Status: DC
  Filled 2021-12-28: qty 2

## 2021-12-28 MED ORDER — AMLODIPINE BESYLATE 5 MG PO TABS
5.0000 mg | ORAL_TABLET | Freq: Every morning | ORAL | Status: DC
  Administered 2021-12-29: 5 mg via ORAL
  Filled 2021-12-28: qty 1

## 2021-12-28 MED ORDER — ATORVASTATIN CALCIUM 10 MG PO TABS
10.0000 mg | ORAL_TABLET | Freq: Every evening | ORAL | Status: DC
  Filled 2021-12-28: qty 1

## 2021-12-28 MED ORDER — CLOPIDOGREL BISULFATE 75 MG PO TABS: 75.0000 mg | ORAL_TABLET | Freq: Every day | ORAL | Status: DC

## 2021-12-28 MED ORDER — BUPROPION HCL ER (XL) 150 MG PO TB24
150.0000 mg | ORAL_TABLET | Freq: Every day | ORAL | Status: DC
  Administered 2021-12-29: 150 mg via ORAL
  Filled 2021-12-28 (×3): qty 1

## 2021-12-28 MED ORDER — HEPARIN (PORCINE) IN NACL 1000-0.9 UT/500ML-% IV SOLN
INTRAVENOUS | Status: DC | PRN
  Administered 2021-12-28 (×2): 500 mL

## 2021-12-28 MED ORDER — ONDANSETRON HCL 4 MG/2ML IJ SOLN: 4.0000 mg | Freq: Four times a day (QID) | INTRAMUSCULAR | Status: DC | PRN

## 2021-12-28 SURGICAL SUPPLY — 24 items
BALLN EMERGE MR 2.0X12 (BALLOONS) ×2
BALLN ~~LOC~~ EUPHORA RX 2.5X12 (BALLOONS) ×2
BALLOON EMERGE MR 2.0X12 (BALLOONS) IMPLANT
BALLOON ~~LOC~~ EUPHORA RX 2.5X12 (BALLOONS) IMPLANT
BAND CMPR LRG ZPHR (HEMOSTASIS) ×1
BAND ZEPHYR COMPRESS 30 LONG (HEMOSTASIS) ×1 IMPLANT
CATH OPTITORQUE TIG 4.0 5F (CATHETERS) ×1 IMPLANT
CATH VISTA GUIDE 6FR JR4 (CATHETERS) ×1 IMPLANT
GLIDESHEATH SLEND A-KIT 6F 22G (SHEATH) ×1 IMPLANT
GUIDEWIRE INQWIRE 1.5J.035X260 (WIRE) IMPLANT
GUIDEWIRE PRESSURE X 175 (WIRE) ×1 IMPLANT
INQWIRE 1.5J .035X260CM (WIRE) ×2
KIT ENCORE 26 ADVANTAGE (KITS) ×1 IMPLANT
KIT ESSENTIALS PG (KITS) ×1 IMPLANT
KIT HEART LEFT (KITS) ×3 IMPLANT
MAT PREVALON FULL STRYKER (MISCELLANEOUS) IMPLANT
PACK CARDIAC CATHETERIZATION (CUSTOM PROCEDURE TRAY) ×3 IMPLANT
SHEATH PROBE COVER 6X72 (BAG) IMPLANT
STENT SYNERGY XD 2.25X16 (Permanent Stent) IMPLANT
SYNERGY XD 2.25X16 (Permanent Stent) ×2 IMPLANT
TRANSDUCER W/STOPCOCK (MISCELLANEOUS) ×3 IMPLANT
TUBING CIL FLEX 10 FLL-RA (TUBING) ×3 IMPLANT
WIRE ASAHI PROWATER 180CM (WIRE) ×1 IMPLANT
WIRE HI TORQ VERSACORE-J 145CM (WIRE) ×1 IMPLANT

## 2021-12-28 NOTE — Interval H&P Note (Signed)
Cath Lab Visit (complete for each Cath Lab visit)  Clinical Evaluation Leading to the Procedure:   ACS: No.  Non-ACS:    Anginal Classification: CCS II  Anti-ischemic medical therapy: Minimal Therapy (1 class of medications)  Non-Invasive Test Results: No non-invasive testing performed  Prior CABG: No previous CABG      History and Physical Interval Note:  12/28/2021 11:55 AM  Victorian Wilfrid Lund  has presented today for surgery, with the diagnosis of cad.  The various methods of treatment have been discussed with the patient and family. After consideration of risks, benefits and other options for treatment, the patient has consented to  Procedure(s): LEFT HEART CATH AND CORONARY ANGIOGRAPHY (N/A) as a surgical intervention.  The patient's history has been reviewed, patient examined, no change in status, stable for surgery.  I have reviewed the patient's chart and labs.  Questions were answered to the patient's satisfaction.     Nanetta Batty

## 2021-12-29 ENCOUNTER — Encounter (HOSPITAL_COMMUNITY): Payer: Self-pay | Admitting: Cardiovascular Disease

## 2021-12-29 DIAGNOSIS — I2510 Atherosclerotic heart disease of native coronary artery without angina pectoris: Secondary | ICD-10-CM | POA: Diagnosis not present

## 2021-12-29 DIAGNOSIS — E1151 Type 2 diabetes mellitus with diabetic peripheral angiopathy without gangrene: Secondary | ICD-10-CM | POA: Diagnosis not present

## 2021-12-29 DIAGNOSIS — I10 Essential (primary) hypertension: Secondary | ICD-10-CM | POA: Diagnosis not present

## 2021-12-29 LAB — CBC
HCT: 39 % (ref 36.0–46.0)
Hemoglobin: 12.5 g/dL (ref 12.0–15.0)
MCH: 29.8 pg (ref 26.0–34.0)
MCHC: 32.1 g/dL (ref 30.0–36.0)
MCV: 92.9 fL (ref 80.0–100.0)
Platelets: 304 10*3/uL (ref 150–400)
RBC: 4.2 MIL/uL (ref 3.87–5.11)
RDW: 13.6 % (ref 11.5–15.5)
WBC: 9.5 10*3/uL (ref 4.0–10.5)
nRBC: 0 % (ref 0.0–0.2)

## 2021-12-29 LAB — BASIC METABOLIC PANEL
Anion gap: 14 (ref 5–15)
BUN: 16 mg/dL (ref 6–20)
CO2: 24 mmol/L (ref 22–32)
Calcium: 9 mg/dL (ref 8.9–10.3)
Chloride: 105 mmol/L (ref 98–111)
Creatinine, Ser: 0.96 mg/dL (ref 0.44–1.00)
GFR, Estimated: >60 mL/min (ref >60)
Glucose, Bld: 115 mg/dL — ABNORMAL HIGH (ref 70–99)
Potassium: 3.7 mmol/L (ref 3.5–5.1)
Sodium: 143 mmol/L (ref 135–145)

## 2021-12-29 LAB — GLUCOSE, CAPILLARY
Glucose-Capillary: 141 mg/dL — ABNORMAL HIGH (ref 70–99)
Glucose-Capillary: 106 mg/dL — ABNORMAL HIGH (ref 70–99)

## 2021-12-29 NOTE — TOC Initial Note (Signed)
Transition of Care [facility]) - Initial/Assessment Note    Patient Details  Name: Allison [name] MRN: [id number] Date of Birth: [birth year]  Transition of Care [facility]) CM/SW Contact:    Durenda Guthrie, RN Phone Number: 12/29/2021, 12:54 PM  Clinical Narrative:                 Transition of Care Screening Note:Transition of Care Department [facility]) has reviewed patient and no TOC needs have been identified at this time. We will continue to monitor patient advancement through Interdisciplinary progressions. If new patient transition needs arise, please place a consult.            Patient Goals and CMS Choice        Expected Discharge Plan and Services                                                Prior Living Arrangements/Services                       Activities of Daily Living      Permission Sought/Granted                  Emotional Assessment              Admission diagnosis:  CAD (coronary artery disease) [I25.10] Patient Active Problem List   Diagnosis Date Noted   CAD (coronary artery disease) 12/28/2021   Chest pain of uncertain etiology 12/19/2021   Peripheral arterial disease (HCC) 10/13/2021   Hyperlipidemia 10/13/2021   Pressure ulcer 12/16/2014   Hypokalemia 12/08/2014   Morbid obesity (HCC) 12/08/2014   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   DM type 2, uncontrolled, 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   CORONARY ATHEROSCLEROSIS, NATIVE VESSEL 01/18/2010   DENTAL CARIES 01/18/2010   HYPERTENSION, WHITE COAT 01/18/2010   PCP:  [facility]:   [facility] - [place], [place] - [address] [facility] [place] [address] Phone: [phone] Fax: [phone]  [facility] [facility] [address] - [place], [place] - [address] [place] [address] Phone: [phone] Fax: [phone]  [facility] [address] [place] -  [address] [place] [address] Phone: [phone] Fax: [phone]     Social Determinants of Health (SDOH) Interventions    Readmission Risk Interventions     No data to display

## 2021-12-29 NOTE — Progress Notes (Signed)
CARDIAC REHAB PHASE I   PRE:  Rate/Rhythm: 76 SR    BP: sitting 150/67    SaO2: 90 after BR, 95 RA at rest  MODE:  Ambulation: 110  ft   POST:  Rate/Rhythm: 96 SR    BP: sitting 186/67     SaO2: 87 RA, up to 92 RA with rest  Pt c/o 7/10 CP on my arrival. Sts it has been present since last night. Sts sharp, tight. Ambulated at slow pace with cane. SOB and fatigue with distance, mild hypoxia. Pt did not mention CP and when asked she sts it is "ok". Encouraged pt that her arteries are open and she could work on obesity and exercise. Discussed stent, Plavix, restrictions, diet, exercise (modified to begin with 3 min, 3-5 x a day and increase as tolerated), NTG and CRPII. Will refer to [place] CRPII (hope she can do recumbent machines with success). [id number]   Ethelda Chick CES, ACSM 12/29/2021 9:01 AM

## 2021-12-29 NOTE — Plan of Care (Signed)
  Problem: Education: Goal: Understanding of CV disease, CV risk reduction, and recovery process will improve Outcome: Progressing Goal: Individualized Educational Video(s) Outcome: Progressing   Problem: Activity: Goal: Ability to return to baseline activity level will improve Outcome: Progressing   Problem: Cardiovascular: Goal: Ability to achieve and maintain adequate cardiovascular perfusion will improve Outcome: Progressing Goal: Vascular access site(s) Level 0-1 will be maintained Outcome: Progressing   Problem: Health Behavior/Discharge Planning: Goal: Ability to safely manage health-related needs after discharge will improve Outcome: Progressing   Problem: Education: Goal: Knowledge of General Education information will improve Description: Including pain rating scale, medication(s)/side effects and non-pharmacologic comfort measures Outcome: Progressing   Problem: Health Behavior/Discharge Planning: Goal: Ability to manage health-related needs will improve Outcome: Progressing   Problem: Clinical Measurements: Goal: Ability to maintain clinical measurements within normal limits will improve Outcome: Progressing Goal: Will remain free from infection Outcome: Progressing Goal: Diagnostic test results will improve Outcome: Progressing Goal: Respiratory complications will improve Outcome: Progressing Goal: Cardiovascular complication will be avoided Outcome: Progressing   Problem: Activity: Goal: Risk for activity intolerance will decrease Outcome: Progressing   Problem: Nutrition: Goal: Adequate nutrition will be maintained Outcome: Progressing   Problem: Coping: Goal: Level of anxiety will decrease Outcome: Progressing   Problem: Elimination: Goal: Will not experience complications related to bowel motility Outcome: Progressing Goal: Will not experience complications related to urinary retention Outcome: Progressing   Problem: Pain Managment: Goal:  General experience of comfort will improve Outcome: Progressing   Problem: Safety: Goal: Ability to remain free from injury will improve Outcome: Progressing   Problem: Skin Integrity: Goal: Risk for impaired skin integrity will decrease Outcome: Progressing   Problem: Education: Goal: Ability to describe self-care measures that may prevent or decrease complications (Diabetes Survival Skills Education) will improve Outcome: Progressing Goal: Individualized Educational Video(s) Outcome: Progressing   Problem: Coping: Goal: Ability to adjust to condition or change in health will improve Outcome: Progressing   Problem: Fluid Volume: Goal: Ability to maintain a balanced intake and output will improve Outcome: Progressing   Problem: Health Behavior/Discharge Planning: Goal: Ability to identify and utilize available resources and services will improve Outcome: Progressing Goal: Ability to manage health-related needs will improve Outcome: Progressing   Problem: Metabolic: Goal: Ability to maintain appropriate glucose levels will improve Outcome: Progressing   Problem: Nutritional: Goal: Maintenance of adequate nutrition will improve Outcome: Progressing Goal: Progress toward achieving an optimal weight will improve Outcome: Progressing   Problem: Skin Integrity: Goal: Risk for impaired skin integrity will decrease Outcome: Progressing   Problem: Tissue Perfusion: Goal: Adequacy of tissue perfusion will improve Outcome: Progressing   

## 2021-12-29 NOTE — Discharge Summary (Signed)
Discharge Summary    Patient ID: Allison [name] MRN: [id number]; DOB: 1960/09/12  Admit date: 12/28/2021 Discharge date: 12/29/2021  PCP:  [facility] [place] [facility] Cardiologist:  Quay Burow, MD     Discharge Diagnoses    Principal Problem:   CAD (coronary artery disease) Active Problems:   HYPERTENSION, WHITE COAT   Hyperlipidemia   Diagnostic Studies/Procedures    Cath: 12/28/21    Ost RCA to Prox RCA lesion is 30% stenosed.   Mid RCA lesion is 75% stenosed.   Previously placed Prox RCA to Mid RCA stent of unknown type is  widely patent.   A drug-eluting stent was successfully placed using a SYNERGY XD 2.25X16.   Post intervention, there is a 0% residual stenosis.   Post intervention, there is a 0% residual stenosis.    IMPRESSION: Successful distal RCA PCI and drug-eluting stenting which was guided by DFR suggesting physiologic significance in the setting of accelerated angina.  Previously placed and was widely patent.  She had no other significant CAD.  She had normal LV function.  Plan will be for hydration overnight, discharged home in the morning.  I will see her back once next week and will refer her back to Dr. Claudie Leach after that.  She left the lab in stable condition.   Quay Burow. MD, [facility] 12/28/2021 1:29 PM  Diagnostic Dominance: Right  Intervention   _____________   History of Present Illness     Allison [name] is a 61 y.o. female with past medical history of peripheral vascular disease, hypertension, hyperlipidemia, use and chest pain.  She has been followed by Dr. Alvester Chou as well as Dr. Claudie Leach through [place].  She underwent right iliac artery stenting in 2012.  Recent lower extremity Dopplers on 11/08/2021 showed ABIs of 0.6-0.7 range.  Patent extrailiac artery monophasic waveforms.  She was referred back to Dr. Alvester Chou and seen in the office on 6/27 with complaints of chest pain.  They attempted to do a coronary CT  which was unable to be performed secondary to tachycardia and frequent PVCs.  Given this she was set up for outpatient cardiac catheterization.  Hospital Course     Underwent cardiac catheterization noted follow-up with Dr. Alvester Chou was successful distal RCA PCI/DES guided by DFR suggesting physiological significance with accelerated angina.  Previously placed stents widely patent.  No other significant CAD.  Recommendations for DAPT with aspirin/Plavix for at least 6 months.  No complications noted post cath.  She was continued on home medications without significant adjustment.  Seen by cardiac rehab and ambulated prior to discharge.   General: Morbidly obese female appearing in no acute distress. Head: Normocephalic, atraumatic.  Neck: Supple without bruits, JVD. Lungs:  Resp regular and unlabored, CTA. Heart: RRR, S1, S2, no S3, S4, or murmur; no rub. Abdomen: Soft, non-tender, non-distended with normoactive bowel sounds. No hepatomegaly. No rebound/guarding. No obvious abdominal masses. Extremities: No clubbing, cyanosis, edema. Distal pedal pulses are 2+ bilaterally. Right radial cath site stable without bruising or hematoma Neuro: Alert and oriented X 3. Moves all extremities spontaneously. Psych: Normal affect.  Patient was seen by Dr. Harrington Challenger and deemed stable for discharge home.  Follow-up in the office has been arranged.  Medications sent to [place].  Educated by [employer].D. prior to discharge.  Did the patient have an acute coronary syndrome (MI, NSTEMI, STEMI, etc) this admission?:  No  Did the patient have a percutaneous coronary intervention (stent / angioplasty)?:  Yes.     Cath/PCI Registry Performance & Quality Measures: Aspirin prescribed? - Yes ADP Receptor Inhibitor (Plavix/Clopidogrel, Brilinta/Ticagrelor or Effient/Prasugrel) prescribed (includes medically managed patients)? - Yes High Intensity Statin (Lipitor 40-80mg  or Crestor 20-40mg )  prescribed? - No - intolerant to higher doses For EF <40%, was ACEI/ARB prescribed? - Not Applicable (EF >/= 22%) For EF <40%, Aldosterone Antagonist (Spironolactone or Eplerenone) prescribed? - Not Applicable (EF >/= 48%) Cardiac Rehab Phase II ordered? - Yes     _____________  Discharge Vitals Blood pressure (!) 142/64, pulse 74, temperature 98.5 F (36.9 C), temperature source Oral, resp. rate 20, height 5\' 2"  (1.575 m), weight (!) 159.2 kg, SpO2 95 %.  Filed Weights   12/28/21 0958  Weight: (!) 159.2 kg    Labs & Radiologic Studies    CBC Recent Labs    12/28/21 1031 12/29/21 0300  WBC 11.7* 9.5  HGB 12.9 12.5  HCT 40.4 39.0  MCV 92.9 92.9  PLT 310 250   Basic Metabolic Panel Recent Labs    12/29/21 0300  NA 143  K 3.7  CL 105  CO2 24  GLUCOSE 115*  BUN 16  CREATININE 0.96  CALCIUM 9.0   Liver Function Tests No results for input(s): "AST", "ALT", "ALKPHOS", "BILITOT", "PROT", "ALBUMIN" in the last 72 hours. No results for input(s): "LIPASE", "AMYLASE" in the last 72 hours. High Sensitivity Troponin:   No results for input(s): "TROPONINIHS" in the last 720 hours.  BNP Invalid input(s): "POCBNP" D-Dimer No results for input(s): "DDIMER" in the last 72 hours. Hemoglobin A1C No results for input(s): "HGBA1C" in the last 72 hours. Fasting Lipid Panel No results for input(s): "CHOL", "HDL", "LDLCALC", "TRIG", "CHOLHDL", "LDLDIRECT" in the last 72 hours. Thyroid Function Tests No results for input(s): "TSH", "T4TOTAL", "T3FREE", "THYROIDAB" in the last 72 hours.  Invalid input(s): "FREET3" _____________  CARDIAC CATHETERIZATION  Result Date: 12/28/2021 Images from the original result were not included.   Ost RCA to Prox RCA lesion is 30% stenosed.   Mid RCA lesion is 75% stenosed.   Previously placed Prox RCA to Mid RCA stent of unknown type is  widely patent.   A drug-eluting stent was successfully placed using a SYNERGY XD 2.25X16.   Post intervention, there  is a 0% residual stenosis.   Post intervention, there is a 0% residual stenosis. Allison [name] is a 61 y.o. female  [id number] LOCATION:  FACILITY: [facility] PHYSICIAN: Quay Burow, M.D. [birth year] DATE OF PROCEDURE:  12/28/2021 DATE OF DISCHARGE: CARDIAC CATHETERIZATION Marjorie Smolder DES RCA History obtained from chart review. BETTYLEE FEIG is a 61 y.o.  morbidly overweight married Caucasian female mother of 67, grandmother of 1 grandchild who currently does not work and was referred to me by Dr. Claudie Leach, her cardiologist, for peripheral vascular evaluation.  I last saw her in the office 10/13/2021.  She stopped smoking in 2012 at the time of her cardiac catheterization.  She does have treated hypertension, hyperlipidemia and diabetes.  I stented her right extrailiac artery in 2012.  That time her left lower extremity was free of disease.  She currently complains of left lower extremity claudication.  She is also had accelerating angina which is nitrate responsive the last month or 2.  She had COVID back in September.  She also has morbid obesity. I performed lower extremity arterial Doppler studies on her 11/08/2021 which revealed ABIs of 0.6-0.7 range.  She did have  patent extrailiac arteries with monophasic waveforms.  They were not able to interrogate the distal abdominal aorta and common iliac arteries because of her body habitus.  She also has complained of daily chest pain.  We attempted to do a coronary CTA which could not be performed because of tachycardia and PVCs.  She presents today for outpatient radial diagnostic coronary angiography. PROCEDURE DESCRIPTION: The patient was brought to the second floor [place] Cardiac cath lab in the postabsorptive state.  She was premedicated with IV Versed and fentanyl.  Her right wrist was prepped and shaved in usual sterile fashion. Xylocaine 1% was used for local anesthesia. A 6 French sheath was inserted into the right radial artery using standard Seldinger technique. The  patient received 7000 units  of heparin intravenously.  A 5 [place] TIG catheter was used for selective coronary angiography and left ventriculography respectively.  Isovue dye was used for the entirety of the case (130 cc of contrast total to patient).  Retrograde aortic, ventricular and pullback pressures were recorded.  LVEDP was 18.  Radial cocktail was administered via the SideArm sheath. The patient received a total of 16,000's of heparin with an ACT of 317.  Isovue dye is used for the entirety of the case.  Retroaortic pressures monitored during the case.  I used a JR4 6 [place] guide catheter along with an Abbott DFR wire.  I crossed the lesion easily measuring a DFR of 0.88 suggesting this was physiologically significant. Using a 0.14 [place] guidewire along with a 2 mm x 12 mm balloon distal RCA lesion just beyond the distal edge of the previously placed stent was predilated.  A 2.25 x 16 mm long Synergy drug-eluting stent was then carefully positioned and deployed at 14 atm.  It was postdilated with a 2.5 x 12 mm noncompliant balloon at 14 atm (2.56 mm) resulting reduction of a 75% distal RCA stenosis to 0% residual.  The patient tolerated the procedure well without hemodynamic or electrocardiographic sequelae.  The guidewire and catheter were removed.  The sheath was removed and a Zephyr hemostatic band was placed on the right wrist to achieve patent hemostasis.  The patient received an additional 300 mg of p.o. clopidogrel.   Successful distal RCA PCI and drug-eluting stenting which was guided by DFR suggesting physiologic significance in the setting of accelerated angina.  Previously placed and was widely patent.  She had no other significant CAD.  She had normal LV function.  Plan will be for hydration overnight, discharged home in the morning.  I will see her back once next week and will refer her back to Dr. Claudie Leach after that.  She left the lab in stable condition. Quay Burow. MD, [facility]  12/28/2021 1:29 PM     Disposition   Pt is being discharged home today in good condition.  Follow-up Plans & Appointments     Follow-up Information     Lorretta Harp, MD Follow up on 02/05/2022.   Specialties: Cardiology, Radiology Why: at 2pm for your follow up appt Contact information: [address] [place] [address] [phone]                Discharge Instructions     AMB Referral to Cardiac Rehabilitation - Phase II   Complete by: As directed    Diagnosis: Coronary Stents   After initial evaluation and assessments completed: Virtual Based Care may be provided alone or in conjunction with Phase 2 Cardiac Rehab based on patient  barriers.: Yes       Discharge Medications   Allergies as of 12/29/2021       Reactions   Sulfonamide Derivatives Anaphylaxis   Aspirin Nausea And Vomiting   Metoprolol-hctz Er Swelling   Eyes swell   Penicillins Hives   Latex Rash        Medication List     TAKE these medications    acetaminophen 500 MG tablet Commonly known as: TYLENOL Take 500-1,000 mg by mouth every 6 (six) hours as needed (pain).   albuterol 108 (90 Base) MCG/ACT inhaler Commonly known as: VENTOLIN HFA Inhale 2 puffs into the lungs every 6 (six) hours as needed for wheezing or shortness of breath.   amLODipine 5 MG tablet Commonly known as: NORVASC Take 5 mg by mouth in the morning.   aspirin EC 81 MG tablet Take 81 mg by mouth in the morning.   atorvastatin 10 MG tablet Commonly known as: LIPITOR Take 10 mg by mouth every evening.   Basaglar KwikPen 100 UNIT/ML Inject 90 Units into the skin at bedtime.   Blink Tears 0.25 % Soln Generic drug: Polyethylene Glycol 400 Place 1-2 drops into both eyes 3 (three) times daily as needed (dry/irritated eyes).   buPROPion 150 MG 24 hr tablet Commonly known as: WELLBUTRIN XL Take 1 tablet (150 mg total) by mouth daily.   calcium carbonate 1500 (600 Ca) MG Tabs  tablet Commonly known as: OSCAL Take 600 mg of elemental calcium by mouth in the morning.   Choline Fenofibrate 135 MG capsule Take 135 mg by mouth in the morning.   cilostazol 100 MG tablet Commonly known as: PLETAL Take 100 mg by mouth 2 (two) times daily.   clopidogrel 75 MG tablet Commonly known as: PLAVIX Take 75 mg by mouth in the morning.   clotrimazole-betamethasone cream Commonly known as: LOTRISONE Apply 1 Application topically 2 (two) times daily as needed (skin irritation).   HAIR/SKIN/NAILS PO Take 2 tablets by mouth in the morning.   hydrALAZINE 50 MG tablet Commonly known as: APRESOLINE Take 1 tablet (50 mg total) by mouth 3 (three) times daily.   HYDROcodone-acetaminophen 10-325 MG tablet Commonly known as: NORCO Take 1 tablet by mouth 2 (two) times daily as needed (pain.).   hydrocortisone 2.5 % ointment Apply 1 Application topically 2 (two) times daily as needed (irritation (eyelids)).   hydrOXYzine 25 MG tablet Commonly known as: ATARAX Take 25 mg by mouth every 6 (six) hours as needed for itching.   isosorbide mononitrate 60 MG 24 hr tablet Commonly known as: IMDUR Take 60 mg by mouth in the morning.   Jardiance 25 MG Tabs tablet Generic drug: empagliflozin Take 25 mg by mouth in the morning.   lubiprostone 8 MCG capsule Commonly known as: AMITIZA Take 8 mcg by mouth with breakfast, with lunch, and with evening meal.   Magnesium 250 MG Tabs Take 250 mg by mouth at bedtime.   Magnesium Oxide 400 MG Caps Take 800 mg by mouth in the morning.   multivitamin with minerals Tabs tablet Take 1 tablet by mouth in the morning.   nebivolol 5 MG tablet Commonly known as: Bystolic Take 1 tablet (5 mg total) by mouth daily.   nitroGLYCERIN 0.4 MG SL tablet Commonly known as: NITROSTAT 0.4 mg every 5 (five) minutes x 3 doses as needed for chest pain.   NovoLOG 100 UNIT/ML injection Generic drug: insulin aspart Inject 40 Units into the skin in  the morning, at noon, and  at bedtime.   nystatin cream Commonly known as: MYCOSTATIN Apply 1 Application topically 2 (two) times daily as needed (skin irritation).   Olmesartan-amLODIPine-HCTZ 40-5-25 MG Tabs Take 1 tablet by mouth daily.   PARoxetine 30 MG tablet Commonly known as: PAXIL Take 1 tablet (30 mg total) by mouth daily.   Potassium 99 MG Tabs Take 198 mg by mouth in the morning.   potassium chloride SA 20 MEQ tablet Commonly known as: KLOR-CON M Take 20 mEq by mouth in the morning.   Semaglutide(0.25 or 0.5MG/DOS) 2 MG/1.5ML Sopn Inject 0.5 mg into the skin every Tuesday.   Tremfya 100 MG/ML Sopn Generic drug: Guselkumab Inject into the skin every 8 (eight) weeks.   vitamin C 1000 MG tablet Take 2,000 mg by mouth at bedtime.   Vitamin D (Ergocalciferol) 1.25 MG (50000 UNIT) Caps capsule Commonly known as: DRISDOL Take 50,000 Units by mouth every Monday.   Vitamin D-3 125 MCG (5000 UT) Tabs Take 5,000 Units by mouth every evening.   zinc sulfate 220 (50 Zn) MG capsule Take 220 mg by mouth at bedtime.           Outstanding Labs/Studies   N/a   Duration of Discharge Encounter   Greater than 30 minutes including physician time.  Signed, Reino Bellis, NP 12/29/2021, 8:31 AM  Patient seen and examined   I agree with findings as noted above by L Roberts    Pt breathing comfortably  No CP Lungs CTA Cardiac RRR  no S3 Ext are without edema   R radial area OK  Plan as noted above   Will be on ASA / Plavix  and Pletal Has CT of abdomen coming up Will follow up in clinic after  D/C today   Dorris Carnes MD

## 2021-12-30 LAB — LIPOPROTEIN A (LPA): Lipoprotein (a): 227.1 nmol/L — ABNORMAL HIGH (ref <75.0)

## 2022-01-24 ENCOUNTER — Encounter (HOSPITAL_COMMUNITY): Payer: Self-pay | Admitting: Psychiatry

## 2022-01-24 ENCOUNTER — Telehealth (HOSPITAL_BASED_OUTPATIENT_CLINIC_OR_DEPARTMENT_OTHER): Payer: BC Managed Care – PPO | Admitting: Psychiatry

## 2022-01-24 DIAGNOSIS — F330 Major depressive disorder, recurrent, mild: Secondary | ICD-10-CM | POA: Diagnosis not present

## 2022-01-24 DIAGNOSIS — F419 Anxiety disorder, unspecified: Secondary | ICD-10-CM | POA: Diagnosis not present

## 2022-01-24 MED ORDER — PAROXETINE HCL 30 MG PO TABS: 30.0000 mg | ORAL_TABLET | Freq: Every day | ORAL | 2 refills | Status: DC

## 2022-01-24 MED ORDER — BUPROPION HCL ER (XL) 150 MG PO TB24: 150.0000 mg | ORAL_TABLET | Freq: Every day | ORAL | 2 refills | Status: DC

## 2022-01-24 NOTE — Progress Notes (Signed)
Virtual Visit via Telephone Note  I connected with Allison [name] on 01/24/22 at  2:00 PM EDT by telephone and verified that I am speaking with the correct person using two identifiers.  Location: Patient: Home Provider: Home   I discussed the limitations, risks, security and privacy concerns of performing an evaluation and management service by telephone and the availability of in person appointments. I also discussed with the patient that there may be a patient responsible charge related to this service. The patient expressed understanding and agreed to proceed.   History of Present Illness: Patient is evaluated by phone session.  Recently she had a cardiac procedure as having chest pain.  Since the procedure she is feeling better and has no more chest pain.  She reported her anxiety was recently increased because of the chest pain but now she is feeling better.  Today is her husband's birthday.  They have been married for 42 years.  She admitted some concern about her health issues, weight and blood sugar.  She is taking Ozempic and lately noticed having nausea and she will going to talk to her doctor about nausea.  Her blood pressure medicines were changed and she is hoping to have a better control on her blood pressure.  Patient denies any panic attack, crying spells or any feeling of hopelessness or worthlessness.  She denies any tremors or shakes.  She is retired.  She denies drinking or using any illegal substances.    Past Psychiatric History: Reviewed. H/O mood swing, anger and irritability. No h/o suicidal attempt or inpatient treatment.  Seeing psychiatrist since 2003. Tried Risperdal but stopped after feeling better. We tried Lamictal caused itching, Abilify and Rexulti cannot afford.   Psychiatric Specialty Exam: Physical Exam  Review of Systems  Gastrointestinal:  Positive for nausea.    Weight (!) 351 lb (159.2 kg).Body mass index is 64.2 kg/m.  General Appearance: NA  Eye  Contact:  NA  Speech:  Slow  Volume:  Normal  Mood:  Anxious  Affect:  NA  Thought Process:  Goal Directed  Orientation:  Full (Time, Place, and Person)  Thought Content:  Logical  Suicidal Thoughts:  No  Homicidal Thoughts:  No  Memory:  Immediate;   Good Recent;   Good Remote;   Fair  Judgement:  Good  Insight:  Good  Psychomotor Activity:  NA  Concentration:  Concentration: Fair and Attention Span: Fair  Recall:  Good  Fund of Knowledge:  Good  Language:  Good  Akathisia:  No  Handed:  Right  AIMS (if indicated):     Assets:  Communication Skills Desire for Improvement Housing Social Support Transportation  ADL's:  Intact  Cognition:  WNL  Sleep:   fair with CPAP      Assessment and Plan: Major depressive disorder, recurrent.  Anxiety.  Patient is stable on her current psychotropic medication.  Continue Wellbutrin XL 150 mg in the morning and Paxil 40 mg daily.  Discussed medication side effects and benefits.  Recommended to call us back if she has any question or any concern.  Follow-up in 3 months.  Follow Up Instructions:    I discussed the assessment and treatment plan with the patient. The patient was provided an opportunity to ask questions and all were answered. The patient agreed with the plan and demonstrated an understanding of the instructions.  Collaboration of Care: Other provider involved in patient's care AEB notes are available in epic to review  Patient/Guardian was  advised Release of Information must be obtained prior to any record release in order to collaborate their care with an outside provider. Patient/Guardian was advised if they have not already done so to contact the registration department to sign all necessary forms in order for [place] to release information regarding their care.   Consent: Patient/Guardian gives verbal consent for treatment and assignment of benefits for services provided during this visit. Patient/Guardian expressed  understanding and agreed to proceed.     The patient was advised to call back or seek an in-person evaluation if the symptoms worsen or if the condition fails to improve as anticipated.  I provided 20 minutes of non-face-to-face time during this encounter.   Cleotis Nipper, MD

## 2022-01-29 ENCOUNTER — Ambulatory Visit
Admission: RE | Admit: 2022-01-29 | Discharge: 2022-01-29 | Disposition: A | Discharge status: Home / Self Care | Payer: BC Managed Care – PPO | Source: Ambulatory Visit | Attending: Cardiovascular Disease | Admitting: Cardiovascular Disease

## 2022-01-29 DIAGNOSIS — I739 Peripheral vascular disease, unspecified: Secondary | ICD-10-CM

## 2022-01-29 DIAGNOSIS — R079 Chest pain, unspecified: Secondary | ICD-10-CM

## 2022-01-29 DIAGNOSIS — E782 Mixed hyperlipidemia: Secondary | ICD-10-CM

## 2022-01-29 DIAGNOSIS — R030 Elevated blood-pressure reading, without diagnosis of hypertension: Secondary | ICD-10-CM

## 2022-01-29 MED ORDER — IOPAMIDOL (ISOVUE-370) INJECTION 76%
75.0000 mL | Freq: Once | INTRAVENOUS | Status: AC | PRN
  Administered 2022-01-29: 75 mL via INTRAVENOUS

## 2022-02-05 ENCOUNTER — Ambulatory Visit (INDEPENDENT_AMBULATORY_CARE_PROVIDER_SITE_OTHER): Payer: BC Managed Care – PPO | Admitting: Cardiovascular Disease

## 2022-02-05 ENCOUNTER — Encounter: Payer: Self-pay | Admitting: Cardiovascular Disease

## 2022-02-05 DIAGNOSIS — I2510 Atherosclerotic heart disease of native coronary artery without angina pectoris: Secondary | ICD-10-CM | POA: Diagnosis not present

## 2022-02-05 DIAGNOSIS — I739 Peripheral vascular disease, unspecified: Secondary | ICD-10-CM

## 2022-02-05 NOTE — Progress Notes (Signed)
02/05/2022 Lum Babe Bjelland   11-23-1960  [id number]  [facility], [facility] Primary Cardiologist: Runell Gess MD Milagros Loll, [place], [place]  HPI:  Allison [name] is a 62 y.o.  morbidly overweight married Caucasian female mother of 1, grandmother of 1 grandchild who currently does not work and was referred to me by Dr. Hanley Hays, her cardiologist, for peripheral vascular evaluation.  I last saw her in the office 12/19/2021.  She stopped smoking in 2012 at the time of her cardiac catheterization.  She does have treated hypertension, hyperlipidemia and diabetes.  I stented her right extrailiac artery in 2012.  That time her left lower extremity was free of disease.  She currently complains of left lower extremity claudication.  She is also had accelerating angina which is nitrate responsive the last month or 2.  She had COVID back in September.  She also has morbid obesity.  I performed lower extremity arterial Doppler studies on her 11/08/2021 which revealed ABIs of 0.6-0.7 range.  She did have patent extrailiac arteries with monophasic waveforms.  They were not able to interrogate the distal abdominal aorta and common iliac arteries because of her body habitus.  She also has complained of daily chest pain.  We attempted to do a coronary CTA which could not be performed because of tachycardia and PVCs.  Because of her ongoing chest pain I did perform outpatient radial diagnostic cardiac catheterization on her 12/28/2021 revealing patent mid RCA stent with a fairly focal 75% stenosis just beyond the previously placed stents.  I ended up stenting this with a 2.25 mm x 16 mm long Synergy drug-eluting stent postdilated 2.56 mm.  Her angina has completely resolved.   Current Meds  Medication Sig   acetaminophen (TYLENOL) 500 MG tablet Take 500-1,000 mg by mouth every 6 (six) hours as needed (pain).   albuterol (PROVENTIL HFA;VENTOLIN HFA) 108 (90 BASE) MCG/ACT inhaler Inhale 2 puffs  into the lungs every 6 (six) hours as needed for wheezing or shortness of breath.   amLODipine (NORVASC) 5 MG tablet Take 5 mg by mouth in the morning.   Ascorbic Acid (VITAMIN C) 1000 MG tablet Take 2,000 mg by mouth at bedtime.   aspirin EC 81 MG tablet Take 81 mg by mouth in the morning.   atorvastatin (LIPITOR) 10 MG tablet Take 10 mg by mouth every evening.   Biotin w/ Vitamins C & E (HAIR/SKIN/NAILS PO) Take 2 tablets by mouth in the morning.   buPROPion (WELLBUTRIN XL) 150 MG 24 hr tablet Take 1 tablet (150 mg total) by mouth daily.   calcium carbonate (OSCAL) 1500 (600 Ca) MG TABS tablet Take 600 mg of elemental calcium by mouth in the morning.   Cholecalciferol (VITAMIN D-3) 125 MCG (5000 UT) TABS Take 5,000 Units by mouth every evening.   Choline Fenofibrate 135 MG capsule Take 135 mg by mouth in the morning.   cilostazol (PLETAL) 100 MG tablet Take 100 mg by mouth 2 (two) times daily.   clopidogrel (PLAVIX) 75 MG tablet Take 75 mg by mouth in the morning.   clotrimazole-betamethasone (LOTRISONE) cream Apply 1 Application topically 2 (two) times daily as needed (skin irritation).   hydrALAZINE (APRESOLINE) 50 MG tablet Take 1 tablet (50 mg total) by mouth 3 (three) times daily.   HYDROcodone-acetaminophen (NORCO) 10-325 MG tablet Take 1 tablet by mouth 2 (two) times daily as needed (pain.).   hydrocortisone 2.5 % ointment Apply 1 Application topically 2 (two) times  daily as needed (irritation (eyelids)).   hydrOXYzine (ATARAX/VISTARIL) 25 MG tablet Take 25 mg by mouth every 6 (six) hours as needed for itching.   Insulin Glargine (BASAGLAR KWIKPEN) 100 UNIT/ML Inject 90 Units into the skin at bedtime.   isosorbide mononitrate (IMDUR) 60 MG 24 hr tablet Take 60 mg by mouth in the morning.   JARDIANCE 25 MG TABS tablet Take 25 mg by mouth in the morning.   lubiprostone (AMITIZA) 8 MCG capsule Take 8 mcg by mouth with breakfast, with lunch, and with evening meal.   Magnesium 250 MG TABS  Take 250 mg by mouth at bedtime.   Magnesium Oxide 400 MG CAPS Take 800 mg by mouth in the morning.   Multiple Vitamin (MULTIVITAMIN WITH MINERALS) TABS tablet Take 1 tablet by mouth in the morning.   nebivolol (BYSTOLIC) 5 MG tablet Take 1 tablet (5 mg total) by mouth daily.   nitroGLYCERIN (NITROSTAT) 0.4 MG SL tablet 0.4 mg every 5 (five) minutes x 3 doses as needed for chest pain.   NOVOLOG 100 UNIT/ML injection Inject 35 Units into the skin in the morning, at noon, and at bedtime.   nystatin cream (MYCOSTATIN) Apply 1 Application topically 2 (two) times daily as needed (skin irritation).   Olmesartan-amLODIPine-HCTZ 40-5-25 MG TABS Take 1 tablet by mouth daily.    PARoxetine (PAXIL) 30 MG tablet Take 1 tablet (30 mg total) by mouth daily.   Polyethylene Glycol 400 (BLINK TEARS) 0.25 % SOLN Place 1-2 drops into both eyes 3 (three) times daily as needed (dry/irritated eyes).   Potassium 99 MG TABS Take 198 mg by mouth in the morning.   potassium chloride SA (K-DUR,KLOR-CON) 20 MEQ tablet Take 20 mEq by mouth in the morning.   Semaglutide,0.25 or 0.5MG /DOS, 2 MG/1.5ML SOPN Inject 0.5 mg into the skin every Tuesday.   TREMFYA 100 MG/ML SOPN Inject into the skin every 8 (eight) weeks.   Vitamin D, Ergocalciferol, (DRISDOL) 50000 UNITS CAPS capsule Take 50,000 Units by mouth every Monday.   zinc sulfate 220 (50 Zn) MG capsule Take 220 mg by mouth at bedtime.     Allergies  Allergen Reactions   Sulfonamide Derivatives Anaphylaxis   Aspirin Nausea And Vomiting   Metoprolol-Hctz Er Swelling    Eyes swell   Penicillins Hives   Latex Rash    Social History   Socioeconomic History   Marital status: Married    Spouse name: Not on file   Number of children: Not on file   Years of education: Not on file   Highest education level: Not on file  Occupational History   Not on file  Tobacco Use   Smoking status: Former    Types: Cigarettes    Quit date: 12/23/2009    Years since quitting:  12.1   Smokeless tobacco: Never  Vaping Use   Vaping Use: Never used  Substance and Sexual Activity   Alcohol use: No    Alcohol/week: 0.0 standard drinks of alcohol   Drug use: No   Sexual activity: Not on file  Other Topics Concern   Not on file  Social History Narrative   Not on file   Social Determinants of Health   Financial Resource Strain: Not on file  Food Insecurity: Not on file  Transportation Needs: Not on file  Physical Activity: Not on file  Stress: Not on file  Social Connections: Not on file  Intimate Partner Violence: Not on file     Review of Systems:  General: negative for chills, fever, night sweats or weight changes.  Cardiovascular: negative for chest pain, dyspnea on exertion, edema, orthopnea, palpitations, paroxysmal nocturnal dyspnea or shortness of breath Dermatological: negative for rash Respiratory: negative for cough or wheezing Urologic: negative for hematuria Abdominal: negative for nausea, vomiting, diarrhea, bright red blood per rectum, melena, or hematemesis Neurologic: negative for visual changes, syncope, or dizziness All other systems reviewed and are otherwise negative except as noted above.    Blood pressure (!) 142/78, pulse 80, height 5\' 2"  (1.575 m), weight (!) 350 lb 12.8 oz (159.1 kg), SpO2 95 %.  General appearance: alert and no distress Neck: no adenopathy, no carotid bruit, no JVD, supple, symmetrical, trachea midline, and thyroid not enlarged, symmetric, no tenderness/mass/nodules Lungs: clear to auscultation bilaterally Heart: regular rate and rhythm, S1, S2 normal, no murmur, click, rub or gallop Extremities: extremities normal, atraumatic, no cyanosis or edema Pulses: 2+ and symmetric Skin: Skin color, texture, turgor normal. No rashes or lesions Neurologic: Grossly normal  EKG sinus rhythm at 80 with nonspecific ST and T wave changes.  Personally reviewed this EKG.  ASSESSMENT AND PLAN:   CORONARY ATHEROSCLEROSIS,  NATIVE VESSEL History of coronary artery disease status post prior stenting of her RCA.  She was having accelerated angina.  I performed catheterization on her radially on 12/28/2021 revealing patent RCA stents with a 75% stenosis just beyond the most distal stent which I stented with a 2.25 x 16 mm long Synergy drug-eluting stent postdilated to 2.56 mm.  Since being discharged her angina has resolved.  She is on dual antibiotic therapy.  Peripheral arterial disease (HCC) History of PAD status post right common iliac artery stenting by myself in 2012.  She does complain of left greater than right lower extremity claudication.  Dopplers performed 11/08/2021 revealed monophasic waveforms with moderate increase in velocities in the right external iliac artery.  Her right ABI was 0.62 and left of 0.70.  I did perform CT angiography revealing what appears to be a patent right extrailiac artery stent with probable moderate stenosis of the origin of the left common iliac artery.  Given her obesity and large pannus and concerned that accessing her common femoral artery may be problematic and somewhat high risk.  At this point, I recommend conservative treatment for her claudication.     Lorretta Harp MD FACP,FACC,FAHA, [facility] 02/05/2022 2:32 PM

## 2022-02-05 NOTE — Patient Instructions (Signed)
Medication Instructions:  No changes *If you need a refill on your cardiac medications before your next appointment, please call your pharmacy*   Lab Work: None ordered If you have labs (blood work) drawn today and your tests are completely normal, you will receive your results only by: MyChart Message (if you have MyChart) OR A paper copy in the mail If you have any lab test that is abnormal or we need to change your treatment, we will call you to review the results.   Testing/Procedures: None ordered   Follow-Up: At [facility], you and your health needs are our priority.  As part of our continuing mission to provide you with exceptional heart care, we have created designated Provider Care Teams.  These Care Teams include your primary Cardiologist (physician) and Advanced Practice Providers (APPs -  Physician Assistants and Nurse Practitioners) who all work together to provide you with the care you need, when you need it.  We recommend signing up for the patient portal called "MyChart".  Sign up information is provided on this After Visit Summary.  MyChart is used to connect with patients for Virtual Visits (Telemedicine).  Patients are able to view lab/test results, encounter notes, upcoming appointments, etc.  Non-urgent messages can be sent to your provider as well.   To learn more about what you can do with MyChart, go to [email or web address].    Your next appointment:   Follow up as needed with Dr. Berry   Important Information About Sugar       

## 2022-02-05 NOTE — Assessment & Plan Note (Signed)
History of PAD status post right common iliac artery stenting by myself in 2012.  She does complain of left greater than right lower extremity claudication.  Dopplers performed 11/08/2021 revealed monophasic waveforms with moderate increase in velocities in the right external iliac artery.  Her right ABI was 0.62 and left of 0.70.  I did perform CT angiography revealing what appears to be a patent right extrailiac artery stent with probable moderate stenosis of the origin of the left common iliac artery.  Given her obesity and large pannus and concerned that accessing her common femoral artery may be problematic and somewhat high risk.  At this point, I recommend conservative treatment for her claudication.

## 2022-02-05 NOTE — Assessment & Plan Note (Signed)
History of coronary artery disease status post prior stenting of her RCA.  She was having accelerated angina.  I performed catheterization on her radially on 12/28/2021 revealing patent RCA stents with a 75% stenosis just beyond the most distal stent which I stented with a 2.25 x 16 mm long Synergy drug-eluting stent postdilated to 2.56 mm.  Since being discharged her angina has resolved.  She is on dual antibiotic therapy.

## 2022-03-07 ENCOUNTER — Encounter (HOSPITAL_COMMUNITY): Payer: Self-pay

## 2022-03-28 ENCOUNTER — Telehealth (HOSPITAL_COMMUNITY): Payer: Self-pay

## 2022-03-28 NOTE — Telephone Encounter (Signed)
No response from pt regarding CR.  Closed referral.  

## 2022-04-25 ENCOUNTER — Encounter (HOSPITAL_COMMUNITY): Payer: Self-pay | Admitting: Psychiatry

## 2022-04-25 ENCOUNTER — Telehealth (HOSPITAL_BASED_OUTPATIENT_CLINIC_OR_DEPARTMENT_OTHER): Payer: BC Managed Care – PPO | Admitting: Psychiatry

## 2022-04-25 DIAGNOSIS — F330 Major depressive disorder, recurrent, mild: Secondary | ICD-10-CM | POA: Diagnosis not present

## 2022-04-25 DIAGNOSIS — F419 Anxiety disorder, unspecified: Secondary | ICD-10-CM | POA: Diagnosis not present

## 2022-04-25 MED ORDER — PAROXETINE HCL 30 MG PO TABS: 30.0000 mg | ORAL_TABLET | Freq: Every day | ORAL | 2 refills | Status: DC

## 2022-04-25 MED ORDER — BUPROPION HCL ER (XL) 150 MG PO TB24: 150.0000 mg | ORAL_TABLET | Freq: Every day | ORAL | 2 refills | Status: DC

## 2022-04-25 NOTE — Progress Notes (Signed)
Virtual Visit via Telephone Note  I connected with Allison [name] on 04/25/22 at  2:00 PM EDT by telephone and verified that I am speaking with the correct person using two identifiers.  Location: Patient: Home Provider: Home Office   I discussed the limitations, risks, security and privacy concerns of performing an evaluation and management service by telephone and the availability of in person appointments. I also discussed with the patient that there may be a patient responsible charge related to this service. The patient expressed understanding and agreed to proceed.   History of Present Illness: Patient is evaluated by phone session.  She is taking all her medication as prescribed.  She endorsed lately got upset when her U-Tox positive for ecstasy at her pain management office.  Patient told she has never used ecstasy and she never seen it to see what it looked like.  She is sure that there is some mixup at the lab.  She has given twice her urine because she does not want to be labeled as a drug addict.  So far she have not heard from the lab at pain management and she is hoping it was a mistake.  Patient overall doing okay and she feels combination of Wellbutrin and Paxil keeping her mood stable.  She denies any crying spells or any feeling of hopelessness or worthlessness.  She lives with her husband.  She is taking Ozempic and she noticed nausea is finally get better and she noticed her blood sugar also better.  However she not able to lose weight because she does not do exercise because of multiple health issues.  Recently she had a visit with a cardiology and her blood pressure is stable.  She takes multiple medication.  Patient denies any panic attack or suicidal thoughts.  She has no tremors or shakes.  Her plan is to visit her daughter who lives in [place] on the holidays.   Past Psychiatric History: Reviewed. H/O mood swing, anger and irritability. No h/o suicidal attempt or  inpatient treatment.  Seeing psychiatrist since 2003. Tried Risperdal but stopped after feeling better. We tried Lamictal caused itching, Abilify and Rexulti cannot afford.   Psychiatric Specialty Exam: Physical Exam  Review of Systems  Weight (!) 350 lb (158.8 kg).There is no height or weight on file to calculate BMI.  General Appearance: NA  Eye Contact:  NA  Speech:  Slow  Volume:  Decreased  Mood:  Anxious  Affect:  NA  Thought Process:  Descriptions of Associations: Intact  Orientation:  Full (Time, Place, and Person)  Thought Content:  Rumination  Suicidal Thoughts:  No  Homicidal Thoughts:  No  Memory:  Immediate;   Fair Recent;   Fair Remote;   Fair  Judgement:  Intact  Insight:  Present  Psychomotor Activity:  NA  Concentration:  Concentration: Fair and Attention Span: Fair  Recall:  [employer] of Knowledge:  Good  Language:  Good  Akathisia:  No  Handed:  Right  AIMS (if indicated):     Assets:  Communication Skills Desire for Improvement Housing  ADL's:  Intact  Cognition:  WNL  Sleep:   fair with CPAP      Assessment and Plan: Major depressive disorder, recurrent.  Anxiety.  Discussed recent U-Tox results.  Patient had given twice her specimen and hoping it is negative.  She feels the current medicine is working.  I will continue Wellbutrin XL 150 mg in the morning and Paxil 40 mg  daily.  She is seeing a cardiologist and pain management at [facility] by Dr. Tamala Julian.  She is taking multiple medication.  Recommend to call us back if she has any question or any concern.  Follow-up in 3 months.  Follow Up Instructions:    I discussed the assessment and treatment plan with the patient. The patient was provided an opportunity to ask questions and all were answered. The patient agreed with the plan and demonstrated an understanding of the instructions.   The patient was advised to call back or seek an in-person evaluation if the symptoms worsen or if  the condition fails to improve as anticipated.  Collaboration of Care: Other provider involved in patient's care AEB notes are available in epic to review.  Patient/Guardian was advised Release of Information must be obtained prior to any record release in order to collaborate their care with an outside provider. Patient/Guardian was advised if they have not already done so to contact the registration department to sign all necessary forms in order for [place] to release information regarding their care.   Consent: Patient/Guardian gives verbal consent for treatment and assignment of benefits for services provided during this visit. Patient/Guardian expressed understanding and agreed to proceed.    I provided 18 minutes of non-face-to-face time during this encounter.   Kathlee Nations, MD

## 2022-06-12 ENCOUNTER — Other Ambulatory Visit: Payer: Self-pay | Admitting: Cardiovascular Disease

## 2022-07-20 ENCOUNTER — Other Ambulatory Visit: Payer: Self-pay | Admitting: Family Medicine

## 2022-07-20 DIAGNOSIS — Z79899 Other long term (current) drug therapy: Secondary | ICD-10-CM

## 2022-07-25 ENCOUNTER — Encounter (HOSPITAL_COMMUNITY): Payer: Self-pay | Admitting: Psychiatry

## 2022-07-25 ENCOUNTER — Telehealth (HOSPITAL_BASED_OUTPATIENT_CLINIC_OR_DEPARTMENT_OTHER): Payer: BC Managed Care – PPO | Admitting: Psychiatry

## 2022-07-25 DIAGNOSIS — F330 Major depressive disorder, recurrent, mild: Secondary | ICD-10-CM

## 2022-07-25 DIAGNOSIS — F419 Anxiety disorder, unspecified: Secondary | ICD-10-CM

## 2022-07-25 MED ORDER — BUPROPION HCL ER (XL) 150 MG PO TB24: 150.0000 mg | ORAL_TABLET | Freq: Every day | ORAL | 2 refills | Status: DC

## 2022-07-25 MED ORDER — PAROXETINE HCL 30 MG PO TABS: 30.0000 mg | ORAL_TABLET | Freq: Every day | ORAL | 2 refills | Status: DC

## 2022-07-25 NOTE — Progress Notes (Signed)
Virtual Visit via Telephone Note  I connected with Allison [name] on 07/25/22 at  2:00 PM EST by telephone and verified that I am speaking with the correct person using two identifiers.  Location: Patient: Home Provider: Home Office   I discussed the limitations, risks, security and privacy concerns of performing an evaluation and management service by telephone and the availability of in person appointments. I also discussed with the patient that there may be a patient responsible charge related to this service. The patient expressed understanding and agreed to proceed.   History of Present Illness: Patient is evaluated by phone session.  She is compliant with Paxil and Wellbutrin.  She denies any anxiety attack or any crying spells.  Recently she had a visit with the endocrinologist and her hemoglobin A1c is 5.9 which is better than 6.8.  She denies any irritability, anger, mania.  Sometimes she gets tired and fatigued.  She takes multiple medication.  She lives with her husband.  Her holidays were quite but good and she was able to see her daughter.  She denies any tremors, shakes or any EPS.  She like to keep the current medication.  Past Psychiatric History: Reviewed. H/O mood swing, anger and irritability. No h/o suicidal attempt or inpatient treatment.  Seeing psychiatrist since 2003. Tried Risperdal but stopped after feeling better. We tried Lamictal caused itching, Abilify and Rexulti cannot afford.    Psychiatric Specialty Exam: Physical Exam  Review of Systems  Weight (!) 358 lb (162.4 kg).There is no height or weight on file to calculate BMI.  General Appearance: NA  Eye Contact:  NA  Speech:  Clear and Coherent and Slow  Volume:  Normal  Mood:  Euthymic  Affect:  NA  Thought Process:  Goal Directed  Orientation:  Full (Time, Place, and Person)  Thought Content:  WDL  Suicidal Thoughts:  No  Homicidal Thoughts:  No  Memory:  Immediate;   Good Recent;   Fair Remote;    Fair  Judgement:  Intact  Insight:  Present  Psychomotor Activity:  NA  Concentration:  Concentration: Fair and Attention Span: Fair  Recall:  [employer] of Knowledge:  Good  Language:  Good  Akathisia:  No  Handed:  Right  AIMS (if indicated):     Assets:  Communication Skills Desire for Improvement Housing Transportation  ADL's:  Intact  Cognition:  WNL  Sleep:   ok with cpap      Assessment and Plan: Major depressive disorder, recurrent.  Anxiety.  Patient is stable on current medication.  Her last hemoglobin A1c improved from 6.8-5.9.  She feels her anxiety and depressive symptoms are stable.  Continue Wellbutrin XL 150 mg in the morning and Paxil 40 mg daily.  Recommended to call us back if she has any question of any concern.  Follow-up in 3 months.  Follow Up Instructions:    I discussed the assessment and treatment plan with the patient. The patient was provided an opportunity to ask questions and all were answered. The patient agreed with the plan and demonstrated an understanding of the instructions.   The patient was advised to call back or seek an in-person evaluation if the symptoms worsen or if the condition fails to improve as anticipated.  Collaboration of Care: Other provider involved in patient's care AEB notes are available in epic to review.  Patient/Guardian was advised Release of Information must be obtained prior to any record release in order to collaborate their  care with an outside provider. Patient/Guardian was advised if they have not already done so to contact the registration department to sign all necessary forms in order for [place] to release information regarding their care.   Consent: Patient/Guardian gives verbal consent for treatment and assignment of benefits for services provided during this visit. Patient/Guardian expressed understanding and agreed to proceed.    I provided 21 minutes of non-face-to-face time during this encounter.   Kathlee Nations, MD

## 2022-09-11 ENCOUNTER — Other Ambulatory Visit: Payer: Self-pay | Admitting: Cardiovascular Disease

## 2022-09-25 ENCOUNTER — Ambulatory Visit
Admission: RE | Admit: 2022-09-25 | Discharge: 2022-09-25 | Disposition: A | Discharge status: Home / Self Care | Payer: BC Managed Care – PPO | Source: Ambulatory Visit | Attending: Family Medicine | Admitting: Family Medicine

## 2022-09-25 DIAGNOSIS — Z79899 Other long term (current) drug therapy: Secondary | ICD-10-CM

## 2022-10-23 ENCOUNTER — Telehealth (HOSPITAL_BASED_OUTPATIENT_CLINIC_OR_DEPARTMENT_OTHER): Payer: BC Managed Care – PPO | Admitting: Psychiatry

## 2022-10-23 ENCOUNTER — Encounter (HOSPITAL_COMMUNITY): Payer: Self-pay | Admitting: Psychiatry

## 2022-10-23 VITALS — Wt 350.0 lb

## 2022-10-23 DIAGNOSIS — F419 Anxiety disorder, unspecified: Secondary | ICD-10-CM

## 2022-10-23 DIAGNOSIS — F330 Major depressive disorder, recurrent, mild: Secondary | ICD-10-CM | POA: Diagnosis not present

## 2022-10-23 DIAGNOSIS — F4321 Adjustment disorder with depressed mood: Secondary | ICD-10-CM

## 2022-10-23 MED ORDER — PAROXETINE HCL 30 MG PO TABS: 30.0000 mg | ORAL_TABLET | Freq: Every day | ORAL | 2 refills | Status: DC

## 2022-10-23 MED ORDER — BUPROPION HCL ER (XL) 150 MG PO TB24: 150.0000 mg | ORAL_TABLET | Freq: Every day | ORAL | 2 refills | Status: DC

## 2022-10-23 NOTE — Progress Notes (Signed)
[facility] MD Virtual Progress Note   Patient Location: Home Provider Location: Home Office  I connect with patient by telephone and verified that I am speaking with correct person by using two identifiers. I discussed the limitations of evaluation and management by telemedicine and the availability of in person appointments. I also discussed with the patient that there may be a patient responsible charge related to this service. The patient expressed understanding and agreed to proceed.  Allison [name] [id number] 62 y.o.  10/23/2022 2:07 PM  History of Present Illness:  Patient is a elevated by phone session.  She reported had a difficult time in past few weeks.  She lost her mother and when she tried to tell her sister about father's death she find out that her sister also died on 11-17-22 of this month.  Patient told she was not very close to her sister for a while but wanted to share that news and she got in shock when she heard that she died.  Patient told she was also not close to her father but has been in touch on and off in past year.  She admitted lot of crying spells, sadness, going through grief.  She had support from her daughter and husband and she also in touch with her friends but sometimes she has ruminative thoughts.  She was not sleeping well until last night she was able to get some sleep.  She denies any suicidal thoughts or homicidal thoughts.  She also reported nausea since started taking weight loss medication and for better control of diabetes.  Her hemoglobin A1c improved from the past but she does not like nausea as such.  She denies any hallucination, paranoia, anger, agitation.  She has no tremors or shakes.  She denies any panic attack.  Past Psychiatric History: H/O mood swing, anger and irritability. No h/o suicidal attempt or inpatient treatment.  Seeing psychiatrist since 2003. Tried Risperdal but stopped after feeling better. We tried Lamictal caused  itching, Abilify and Rexulti cannot afford.     Outpatient Encounter Medications as of 10/23/2022  Medication Sig   acetaminophen (TYLENOL) 500 MG tablet Take 500-1,000 mg by mouth every 6 (six) hours as needed (pain).   albuterol (PROVENTIL HFA;VENTOLIN HFA) 108 (90 BASE) MCG/ACT inhaler Inhale 2 puffs into the lungs every 6 (six) hours as needed for wheezing or shortness of breath.   amLODipine (NORVASC) 5 MG tablet Take 5 mg by mouth in the morning.   Ascorbic Acid (VITAMIN C) 1000 MG tablet Take 2,000 mg by mouth at bedtime.   aspirin EC 81 MG tablet Take 81 mg by mouth in the morning.   Biotin w/ Vitamins C & E (HAIR/SKIN/NAILS PO) Take 2 tablets by mouth in the morning.   buPROPion (WELLBUTRIN XL) 150 MG 24 hr tablet Take 1 tablet (150 mg total) by mouth daily.   calcium carbonate (OSCAL) 1500 (600 Ca) MG TABS tablet Take 600 mg of elemental calcium by mouth in the morning.   Cholecalciferol (VITAMIN D-3) 125 MCG (5000 UT) TABS Take 5,000 Units by mouth every evening.   Choline Fenofibrate 135 MG capsule Take 135 mg by mouth in the morning.   cilostazol (PLETAL) 100 MG tablet Take 100 mg by mouth 2 (two) times daily.   clopidogrel (PLAVIX) 75 MG tablet Take 75 mg by mouth in the morning.   clotrimazole-betamethasone (LOTRISONE) cream Apply 1 Application topically 2 (two) times daily as needed (skin irritation).   hydrALAZINE (APRESOLINE) 50  MG tablet TAKE 1 TABLET BY MOUTH THREE TIMES DAILY   HYDROcodone-acetaminophen (NORCO) 10-325 MG tablet Take 1 tablet by mouth 2 (two) times daily as needed (pain.).   hydrocortisone 2.5 % ointment Apply 1 Application topically 2 (two) times daily as needed (irritation (eyelids)).   hydrOXYzine (ATARAX/VISTARIL) 25 MG tablet Take 25 mg by mouth every 6 (six) hours as needed for itching.   Insulin Glargine (BASAGLAR KWIKPEN) 100 UNIT/ML Inject 90 Units into the skin at bedtime.   isosorbide mononitrate (IMDUR) 60 MG 24 hr tablet Take 60 mg by mouth in  the morning.   JARDIANCE 25 MG TABS tablet Take 25 mg by mouth in the morning.   lubiprostone (AMITIZA) 8 MCG capsule Take 8 mcg by mouth with breakfast, with lunch, and with evening meal.   Magnesium Oxide 400 MG CAPS Take 800 mg by mouth in the morning.   Multiple Vitamin (MULTIVITAMIN WITH MINERALS) TABS tablet Take 1 tablet by mouth in the morning.   nebivolol (BYSTOLIC) 5 MG tablet Take 1 tablet by mouth once daily   nitroGLYCERIN (NITROSTAT) 0.4 MG SL tablet 0.4 mg every 5 (five) minutes x 3 doses as needed for chest pain.   NOVOLOG 100 UNIT/ML injection Inject 35 Units into the skin in the morning, at noon, and at bedtime.   nystatin cream (MYCOSTATIN) Apply 1 Application topically 2 (two) times daily as needed (skin irritation).   Olmesartan-amLODIPine-HCTZ 40-5-25 MG TABS Take 1 tablet by mouth daily.    PARoxetine (PAXIL) 30 MG tablet Take 1 tablet (30 mg total) by mouth daily.   Polyethylene Glycol 400 (BLINK TEARS) 0.25 % SOLN Place 1-2 drops into both eyes 3 (three) times daily as needed (dry/irritated eyes).   Potassium 99 MG TABS Take 198 mg by mouth in the morning.   Semaglutide,0.25 or 0.5MG /DOS, 2 MG/1.5ML SOPN Inject 0.5 mg into the skin every Tuesday.   TREMFYA 100 MG/ML SOPN Inject into the skin every 8 (eight) weeks.   Vitamin D, Ergocalciferol, (DRISDOL) 50000 UNITS CAPS capsule Take 50,000 Units by mouth every Monday.   zinc sulfate 220 (50 Zn) MG capsule Take 220 mg by mouth at bedtime.   No facility-administered encounter medications on file as of 10/23/2022.    No results found for this or any previous visit (from the past 2160 hour(s)).   Psychiatric Specialty Exam: Physical Exam  Review of Systems  Gastrointestinal:  Positive for nausea.    Weight (!) 350 lb (158.8 kg).There is no height or weight on file to calculate BMI.  General Appearance: NA  Eye Contact:  NA  Speech:  Slow  Volume:  Decreased  Mood:  Anxious and Dysphoric  Affect:  NA  Thought  Process:  Descriptions of Associations: Intact  Orientation:  Full (Time, Place, and Person)  Thought Content:  Rumination  Suicidal Thoughts:  No  Homicidal Thoughts:  No  Memory:  Immediate;   Good Recent;   Fair Remote;   Fair  Judgement:  Intact  Insight:  Present  Psychomotor Activity:  NA  Concentration:  Concentration: Fair and Attention Span: Fair  Recall:  [employer] of Knowledge:  Good  Language:  Good  Akathisia:  No  Handed:  Right  AIMS (if indicated):     Assets:  Communication Skills Desire for Improvement Housing Social Support Transportation  ADL's:  Intact  Cognition:  WNL  Sleep:  using CPAP     Assessment/Plan: Major depressive disorder, recurrent episode, mild (HCC) - Plan:  buPROPion (WELLBUTRIN XL) 150 MG 24 hr tablet, PARoxetine (PAXIL) 30 MG tablet  Anxiety - Plan: buPROPion (WELLBUTRIN XL) 150 MG 24 hr tablet, PARoxetine (PAXIL) 30 MG tablet  Grief  I review psychosocial.  She lost her father and also find out that her sister died on 10-31-22.  The patient was not very close to her father and sister but now she is thinking about her loss and having crying spells.  She has a good support from her daughter and husband.  She is experiencing poor sleep.  Her hemoglobin A1c improved from the past.  She does not want to change the medication and like to keep the Wellbutrin XL 150 mg in the morning and Paxil 40 mg daily.  I encourage consider grief counseling and patient agreed with the plan.  We will refer her for grief counseling.  No change in the medication.  Discussed if symptoms do not improve then call us back otherwise follow-up in 3 months.  Discussed safety concern that anytime having active suicidal thoughts or homicidal thought then she need to call 911 or go to local emergency room.   Follow Up Instructions:     I discussed the assessment and treatment plan with the patient. The patient was provided an opportunity to ask questions and all were  answered. The patient agreed with the plan and demonstrated an understanding of the instructions.   The patient was advised to call back or seek an in-person evaluation if the symptoms worsen or if the condition fails to improve as anticipated.    Collaboration of Care: Other provider involved in patient's care AEB notes are available in epic to review.  Patient/Guardian was advised Release of Information must be obtained prior to any record release in order to collaborate their care with an outside provider. Patient/Guardian was advised if they have not already done so to contact the registration department to sign all necessary forms in order for [place] to release information regarding their care.   Consent: Patient/Guardian gives verbal consent for treatment and assignment of benefits for services provided during this visit. Patient/Guardian expressed understanding and agreed to proceed.     I provided 30 minutes of non face to face time during this encounter.  Note: This document was prepared by [employer] voice dictation technology and any errors that results from this process are unintentional.    Cleotis Nipper, MD 10/23/2022

## 2023-01-22 ENCOUNTER — Encounter (HOSPITAL_COMMUNITY): Payer: Self-pay | Admitting: Psychiatry

## 2023-01-22 ENCOUNTER — Telehealth (HOSPITAL_BASED_OUTPATIENT_CLINIC_OR_DEPARTMENT_OTHER): Payer: BC Managed Care – PPO | Admitting: Psychiatry

## 2023-01-22 VITALS — Wt 350.0 lb

## 2023-01-22 DIAGNOSIS — F419 Anxiety disorder, unspecified: Secondary | ICD-10-CM | POA: Diagnosis not present

## 2023-01-22 DIAGNOSIS — F330 Major depressive disorder, recurrent, mild: Secondary | ICD-10-CM | POA: Diagnosis not present

## 2023-01-22 MED ORDER — BUPROPION HCL ER (XL) 150 MG PO TB24: 150.0000 mg | ORAL_TABLET | Freq: Every day | ORAL | 2 refills | Status: DC

## 2023-01-22 MED ORDER — PAROXETINE HCL 30 MG PO TABS: 30.0000 mg | ORAL_TABLET | Freq: Every day | ORAL | 2 refills | Status: DC

## 2023-01-22 NOTE — Progress Notes (Signed)
[facility] MD Virtual Progress Note   Patient Location: Home  Provider Location: Home Office  I connect with patient by telephone and verified that I am speaking with correct person by using two identifiers. I discussed the limitations of evaluation and management by telemedicine and the availability of in person appointments. I also discussed with the patient that there may be a patient responsible charge related to this service. The patient expressed understanding and agreed to proceed.  Allison [name] [id number] 62 y.o.  01/22/2023 2:09 PM  History of Present Illness:  Patient is evaluated by phone session.  She reported to them past few months.  She reported 2 of her best friend tried recently.  She had lost her father and sister earlier this year.  She admitted struggling with dysphoria but trying to come out of follow-up and start walking every day.  She did not start therapy because she feels she had a good support from her husband and daughter.  She tried to spend more time with 23-year-old grandchild.  She sleeps okay.  Recently she had a visit with her endocrinologist.  Her hemoglobin A1c is slightly increased and now she is taking higher dose of Ozempic.  She admitted that she needed to be more active and watch her calorie intake.  She denies any crying spells or any feeling of hopelessness or worthlessness.  She denies any suicidal thoughts.  Occasionally she had sadness and crying spells when she think about her parents and family members who lost her earlier this year.  She has no tremor or shakes or any EPS.  She is consistent with Wellbutrin and Paxil.  She does not want to change the medication.  She denies any major panic attack.  Past Psychiatric History: H/O mood swing, anger and irritability. No h/o suicidal attempt or inpatient treatment.  Seeing psychiatrist since 2003. Tried Risperdal but stopped after feeling better. We tried Lamictal caused itching, Abilify  and Rexulti cannot afford.       Outpatient Encounter Medications as of 01/22/2023  Medication Sig   acetaminophen (TYLENOL) 500 MG tablet Take 500-1,000 mg by mouth every 6 (six) hours as needed (pain).   albuterol (PROVENTIL HFA;VENTOLIN HFA) 108 (90 BASE) MCG/ACT inhaler Inhale 2 puffs into the lungs every 6 (six) hours as needed for wheezing or shortness of breath.   amLODipine (NORVASC) 5 MG tablet Take 5 mg by mouth in the morning.   Ascorbic Acid (VITAMIN C) 1000 MG tablet Take 2,000 mg by mouth at bedtime.   aspirin EC 81 MG tablet Take 81 mg by mouth in the morning.   Biotin w/ Vitamins C & E (HAIR/SKIN/NAILS PO) Take 2 tablets by mouth in the morning.   buPROPion (WELLBUTRIN XL) 150 MG 24 hr tablet Take 1 tablet (150 mg total) by mouth daily.   calcium carbonate (OSCAL) 1500 (600 Ca) MG TABS tablet Take 600 mg of elemental calcium by mouth in the morning.   Cholecalciferol (VITAMIN D-3) 125 MCG (5000 UT) TABS Take 5,000 Units by mouth every evening.   Choline Fenofibrate 135 MG capsule Take 135 mg by mouth in the morning.   cilostazol (PLETAL) 100 MG tablet Take 100 mg by mouth 2 (two) times daily.   clopidogrel (PLAVIX) 75 MG tablet Take 75 mg by mouth in the morning.   clotrimazole-betamethasone (LOTRISONE) cream Apply 1 Application topically 2 (two) times daily as needed (skin irritation).   hydrALAZINE (APRESOLINE) 50 MG tablet TAKE 1 TABLET BY MOUTH THREE  TIMES DAILY   HYDROcodone-acetaminophen (NORCO) 10-325 MG tablet Take 1 tablet by mouth 2 (two) times daily as needed (pain.).   hydrocortisone 2.5 % ointment Apply 1 Application topically 2 (two) times daily as needed (irritation (eyelids)).   hydrOXYzine (ATARAX/VISTARIL) 25 MG tablet Take 25 mg by mouth every 6 (six) hours as needed for itching.   Insulin Glargine (BASAGLAR KWIKPEN) 100 UNIT/ML Inject 90 Units into the skin at bedtime.   isosorbide mononitrate (IMDUR) 60 MG 24 hr tablet Take 60 mg by mouth in the morning.    JARDIANCE 25 MG TABS tablet Take 25 mg by mouth in the morning.   lubiprostone (AMITIZA) 8 MCG capsule Take 8 mcg by mouth with breakfast, with lunch, and with evening meal.   Magnesium Oxide 400 MG CAPS Take 800 mg by mouth in the morning.   Multiple Vitamin (MULTIVITAMIN WITH MINERALS) TABS tablet Take 1 tablet by mouth in the morning.   nebivolol (BYSTOLIC) 5 MG tablet Take 1 tablet by mouth once daily   nitroGLYCERIN (NITROSTAT) 0.4 MG SL tablet 0.4 mg every 5 (five) minutes x 3 doses as needed for chest pain.   NOVOLOG 100 UNIT/ML injection Inject 35 Units into the skin in the morning, at noon, and at bedtime.   nystatin cream (MYCOSTATIN) Apply 1 Application topically 2 (two) times daily as needed (skin irritation).   Olmesartan-amLODIPine-HCTZ 40-5-25 MG TABS Take 1 tablet by mouth daily.    PARoxetine (PAXIL) 30 MG tablet Take 1 tablet (30 mg total) by mouth daily.   Polyethylene Glycol 400 (BLINK TEARS) 0.25 % SOLN Place 1-2 drops into both eyes 3 (three) times daily as needed (dry/irritated eyes).   Potassium 99 MG TABS Take 198 mg by mouth in the morning.   Semaglutide,0.25 or 0.5MG /DOS, 2 MG/1.5ML SOPN Inject 0.5 mg into the skin every Tuesday.   TREMFYA 100 MG/ML SOPN Inject into the skin every 8 (eight) weeks.   Vitamin D, Ergocalciferol, (DRISDOL) 50000 UNITS CAPS capsule Take 50,000 Units by mouth every Monday.   zinc sulfate 220 (50 Zn) MG capsule Take 220 mg by mouth at bedtime.   No facility-administered encounter medications on file as of 01/22/2023.    No results found for this or any previous visit (from the past 2160 hour(s)).   Psychiatric Specialty Exam: Physical Exam  Review of Systems  Weight (!) 350 lb (158.8 kg).There is no height or weight on file to calculate BMI.  General Appearance: NA  Eye Contact:  NA  Speech:  Slow  Volume:  Normal  Mood:  Dysphoric  Affect:  NA  Thought Process:  Goal Directed  Orientation:  Full (Time, Place, and Person)   Thought Content:  Rumination  Suicidal Thoughts:  No  Homicidal Thoughts:  No  Memory:  Immediate;   Good Recent;   Fair Remote;   Fair  Judgement:  Intact  Insight:  Present  Psychomotor Activity:  NA  Concentration:  Concentration: Fair and Attention Span: Fair  Recall:  Good  Fund of Knowledge:  Good  Language:  Good  Akathisia:  No  Handed:  Right  AIMS (if indicated):     Assets:  Communication Skills Desire for Improvement Housing Social Support Transportation  ADL's:  Intact  Cognition:  WNL  Sleep:  ok     Assessment/Plan: Major depressive disorder, recurrent episode, mild (HCC) - Plan: buPROPion (WELLBUTRIN XL) 150 MG 24 hr tablet, PARoxetine (PAXIL) 30 MG tablet  Anxiety - Plan: buPROPion (WELLBUTRIN XL) 150  MG 24 hr tablet, PARoxetine (PAXIL) 30 MG tablet  Discussed recent loss of 2 friends.  Once again encourage grief counseling but patient not interested and reported she had a good support from her family.  She does not want to change the medication.  She started going outside more frequently and walking.  She is hoping that the increased dose of the Ozempic may help her blood sugar on the next meeting.  Continue Wellbutrin XL 150 mg in the morning and Paxil 40 mg daily.  Recommend to call us back if she has any question or any concern.  Follow-up in 3 months   Follow Up Instructions:     I discussed the assessment and treatment plan with the patient. The patient was provided an opportunity to ask questions and all were answered. The patient agreed with the plan and demonstrated an understanding of the instructions.   The patient was advised to call back or seek an in-person evaluation if the symptoms worsen or if the condition fails to improve as anticipated.    Collaboration of Care: Other provider involved in patient's care AEB notes are available in epic to review.  Patient/Guardian was advised Release of Information must be obtained prior to any record  release in order to collaborate their care with an outside provider. Patient/Guardian was advised if they have not already done so to contact the registration department to sign all necessary forms in order for [place] to release information regarding their care.   Consent: Patient/Guardian gives verbal consent for treatment and assignment of benefits for services provided during this visit. Patient/Guardian expressed understanding and agreed to proceed.     I provided 17 minutes of non face to face time during this encounter.  Note: This document was prepared by [employer] voice dictation technology and any errors that results from this process are unintentional.    Cleotis Nipper, MD 01/22/2023

## 2023-03-11 ENCOUNTER — Other Ambulatory Visit: Payer: Self-pay | Admitting: Cardiovascular Disease

## 2023-04-12 ENCOUNTER — Telehealth: Payer: Self-pay | Admitting: Cardiovascular Disease

## 2023-04-12 MED ORDER — NEBIVOLOL HCL 5 MG PO TABS: 5.0000 mg | ORAL_TABLET | Freq: Every day | ORAL | 0 refills | Status: DC

## 2023-04-12 MED ORDER — HYDRALAZINE HCL 50 MG PO TABS: 50.0000 mg | ORAL_TABLET | Freq: Three times a day (TID) | ORAL | 0 refills | Status: DC

## 2023-04-12 NOTE — Telephone Encounter (Signed)
Pt's medication was sent to pt's pharmacy as requested. Confirmation received.  °

## 2023-04-12 NOTE — Telephone Encounter (Signed)
*  STAT* If patient is at the pharmacy, call can be transferred to refill team.   1. Which medications need to be refilled? (please list name of each medication and dose if known)   nebivolol (BYSTOLIC) 5 MG tablet  hydrALAZINE (APRESOLINE) 50 MG tablet   2. Would you like to learn more about the convenience, safety, & potential cost savings by using the [facility]?   3. Are you open to using the [facility] (Type [facility]. ).   4. Which pharmacy/location (including street and city if local pharmacy) is medication to be sent to?  [facility] [address], [place] - [address]   5. Do they need a 30 day or 90 day supply?   90 day  Patient stated she still has some medication.  Patient has appointment on 1/8.

## 2023-04-25 ENCOUNTER — Other Ambulatory Visit (HOSPITAL_COMMUNITY): Payer: Self-pay

## 2023-04-25 ENCOUNTER — Ambulatory Visit (HOSPITAL_COMMUNITY): Payer: BC Managed Care – PPO | Admitting: Psychiatry

## 2023-04-25 DIAGNOSIS — F419 Anxiety disorder, unspecified: Secondary | ICD-10-CM

## 2023-04-25 DIAGNOSIS — F330 Major depressive disorder, recurrent, mild: Secondary | ICD-10-CM

## 2023-04-25 MED ORDER — BUPROPION HCL ER (XL) 150 MG PO TB24: 150.0000 mg | ORAL_TABLET | Freq: Every day | ORAL | 2 refills | Status: DC

## 2023-04-25 MED ORDER — PAROXETINE HCL 30 MG PO TABS: 30.0000 mg | ORAL_TABLET | Freq: Every day | ORAL | 0 refills | Status: DC

## 2023-05-09 ENCOUNTER — Encounter (HOSPITAL_COMMUNITY): Payer: Self-pay | Admitting: Psychiatry

## 2023-05-09 ENCOUNTER — Other Ambulatory Visit: Payer: Self-pay

## 2023-05-09 ENCOUNTER — Ambulatory Visit (HOSPITAL_COMMUNITY): Payer: BC Managed Care – PPO | Admitting: Psychiatry

## 2023-05-09 VITALS — BP 174/81 | HR 80 | Ht 62.0 in | Wt 325.0 lb

## 2023-05-09 DIAGNOSIS — F419 Anxiety disorder, unspecified: Secondary | ICD-10-CM

## 2023-05-09 DIAGNOSIS — F4321 Adjustment disorder with depressed mood: Secondary | ICD-10-CM

## 2023-05-09 DIAGNOSIS — F330 Major depressive disorder, recurrent, mild: Secondary | ICD-10-CM

## 2023-05-09 MED ORDER — BUPROPION HCL ER (XL) 150 MG PO TB24: 150.0000 mg | ORAL_TABLET | Freq: Every day | ORAL | 2 refills | Status: DC

## 2023-05-09 MED ORDER — PAROXETINE HCL 30 MG PO TABS: 30.0000 mg | ORAL_TABLET | Freq: Every day | ORAL | 2 refills | Status: DC

## 2023-05-09 NOTE — Progress Notes (Addendum)
BH MD/PA/NP OP Progress Note  Patient location; Office Provided location; Office  05/09/2023 10:46 AM Allison [name]  MRN:  [id number]  Chief Complaint:  Chief Complaint  Patient presents with   Follow-up   HPI: Patient came in for her follow-up appointment.  She reported still struggling with grief as one of her dog is very sick.  She lost 2 dogs this year.  She also lost multiple family and her including we have recommended grief counseling but patient has not make any effort.  She sleeps okay.  She spent time with her 17-year-old grandchild who comes from school and stays with her.  Her daughter is taking classes to become a Geologist, engineering.  She had a good support from her daughter.  She denies any crying spells or any feeling of hopelessness or worthlessness.  She lost 20 pounds since the last visit since started Ozempic by her.  She is 7.0.  She is going to have appointment next week to see her allergies.  She denies any paranoia estimation.  She denies any suicidal thoughts.  She is taking Wellbutrin and Paxil tremors, shakes or any EPS.  Not want to change the medication.   Visit Diagnosis:    ICD-10-CM   1. Major depressive disorder, recurrent episode, mild (HCC)  F33.0 buPROPion (WELLBUTRIN XL) 150 MG 24 hr tablet    PARoxetine (PAXIL) 30 MG tablet    2. Anxiety  F41.9 buPROPion (WELLBUTRIN XL) 150 MG 24 hr tablet    PARoxetine (PAXIL) 30 MG tablet    3. Grief  F43.21       Past Psychiatric History: Reviewed H/O mood swing, anger and irritability. No h/o suicidal attempt or inpatient treatment.  Seeing psychiatrist since 2003. Tried Risperdal but stopped after feeling better. We tried Lamictal caused itching, Abilify and Rexulti cannot afford.      Past Medical History:  Past Medical History:  Diagnosis Date   Bipolar disorder (HCC)    Dysphasia    Hemorrhoids    History of MRSA infection    Hyperlipidemia    Hypokalemia    Hypotension    Major depressive  disorder    Migraine headache    Non-insulin dependent diabetes mellitus    Non-ST elevated myocardial infarction (non-STEMI) [facility])     Past Surgical History:  Procedure Laterality Date   CARDIAC CATHETERIZATION  01/03/2010   CORONARY PRESSURE/FFR STUDY N/A 12/28/2021   Procedure: INTRAVASCULAR PRESSURE WIRE/FFR STUDY;  Surgeon: Runell Gess, MD;  Location: [facility];  Service: Cardiovascular;  Laterality: N/A;   CORONARY STENT INTERVENTION N/A 12/28/2021   Procedure: CORONARY STENT INTERVENTION;  Surgeon: Runell Gess, MD;  Location: [facility];  Service: Cardiovascular;  Laterality: N/A;   ENDOMETRIAL ABLATION  2009   Laser   ESOPHAGEAL DILATION  2006   KNEE ARTHROSCOPY  1996   Right   LAPAROTOMY N/A 12/10/2014   Procedure: EXPLORATORY LAPAROTOMY, APPENDECTOMY, SMALL BOWEL RESECTION, WOUND VAC PLACEMENT;  Surgeon: Emelia Loron, MD;  Location: [facility] OR;  Service: General;  Laterality: N/A;   LAPAROTOMY N/A 12/12/2014   Procedure: EXPLORATORY LAPAROTOMY, RENASTAMOSISED SMALL BOWEL;  Surgeon: Axel Filler, MD;  Location: [facility] OR;  Service: General;  Laterality: N/A;   LEFT HEART CATH AND CORONARY ANGIOGRAPHY N/A 12/28/2021   Procedure: LEFT HEART CATH AND CORONARY ANGIOGRAPHY;  Surgeon: Runell Gess, MD;  [facility]: [facility];  Service: Cardiovascular;  Laterality: N/A;   VACUUM ASSISTED CLOSURE CHANGE N/A 12/14/2014  Procedure: ABDOMINAL VACUUM CHANGE;  Surgeon: Chevis Pretty III, MD;  Location: [facility] OR;  Service: General;  Laterality: N/A;   VACUUM ASSISTED CLOSURE CHANGE N/A 12/16/2014   Procedure: VACUUM CHANGE  AND WOUND CLOSURE ;  Surgeon: Chevis Pretty III, MD;  Location: [facility] OR;  Service: General;  Laterality: N/A;    Family Psychiatric History: Reviewed  Family History:  Family History  Problem Relation Age of Onset   Bipolar disorder Sister     Social History:  Social History   Socioeconomic History   Marital status: Married    Spouse name:  Not on file   Number of children: Not on file   Years of education: Not on file   Highest education level: Not on file  Occupational History   Not on file  Tobacco Use   Smoking status: Former    Current packs/day: 0.00    Types: Cigarettes    Quit date: 12/23/2009    Years since quitting: 13.3   Smokeless tobacco: Never  Vaping Use   Vaping status: Never Used  Substance and Sexual Activity   Alcohol use: No    Alcohol/week: 0.0 standard drinks of alcohol   Drug use: No   Sexual activity: Not on file  Other Topics Concern   Not on file  Social History Narrative   Not on file   Social Determinants of Health   Financial Resource Strain: Not on file  Food Insecurity: Low Risk  (12/25/2022)   [facility]   Hunger Vital Sign    Worried About Running Out of Food in the Last Year: Never true    Ran Out of Food in the Last Year: Never true  Transportation Needs: No Transportation Needs (12/25/2022)   [facility]   Transportation    In the past 12 months, has lack of reliable transportation kept you from medical appointments, meetings, work or from getting things needed for daily living? : No  Physical Activity: Not on file  Stress: Not on file  Social Connections: Not on file    Allergies:  Allergies  Allergen Reactions   Sulfonamide Derivatives Anaphylaxis   Aspirin Nausea And Vomiting   Metoprolol-Hctz Er Swelling    Eyes swell   Penicillins Hives   Latex Rash    Metabolic Disorder Labs: Lab Results  Component Value Date   HGBA1C 6.0 (H) 12/07/2014   MPG 126 12/07/2014   MPG 289 (H) 01/02/2010   No results found for: "PROLACTIN" Lab Results  Component Value Date   CHOL (H) 01/02/2010    257        ATP III CLASSIFICATION:  <200     mg/dL   Desirable  409-811  mg/dL   Borderline High  >=914    mg/dL   High          TRIG 782 (H) 12/20/2014   HDL 36 (L) 01/02/2010   CHOLHDL 7.1 01/02/2010   VLDL 68  (H) 01/02/2010   LDLCALC (H) 01/02/2010    153        Total Cholesterol/HDL:CHD Risk Coronary Heart Disease Risk Table                     Men   Women  1/2 Average Risk   3.4   3.3  Average Risk       5.0   4.4  2 X Average Risk   9.6   7.1  3  X Average Risk  23.4   11.0        Use the calculated Patient Ratio above and the CHD Risk Table to determine the patient's CHD Risk.        ATP III CLASSIFICATION (LDL):  <100     mg/dL   Optimal  841-324  mg/dL   Near or Above                    Optimal  130-159  mg/dL   Borderline  401-027  mg/dL   High  >253     mg/dL   Very High   Lab Results  Component Value Date   TSH 0.848 01/02/2010   TSH 0.848 01/02/2010    Therapeutic Level Labs: No results found for: "LITHIUM" No results found for: "VALPROATE" No results found for: "CBMZ"  Current Medications: Current Outpatient Medications  Medication Sig Dispense Refill   acetaminophen (TYLENOL) 500 MG tablet Take 500-1,000 mg by mouth every 6 (six) hours as needed (pain).     albuterol (PROVENTIL HFA;VENTOLIN HFA) 108 (90 BASE) MCG/ACT inhaler Inhale 2 puffs into the lungs every 6 (six) hours as needed for wheezing or shortness of breath.     amLODipine (NORVASC) 5 MG tablet Take 5 mg by mouth in the morning.     Ascorbic Acid (VITAMIN C) 1000 MG tablet Take 2,000 mg by mouth at bedtime.     aspirin EC 81 MG tablet Take 81 mg by mouth in the morning.     Biotin w/ Vitamins C & E (HAIR/SKIN/NAILS PO) Take 2 tablets by mouth in the morning.     buPROPion (WELLBUTRIN XL) 150 MG 24 hr tablet Take 1 tablet (150 mg total) by mouth daily. Bridge to patient appt 11/14 30 tablet 2   calcium carbonate (OSCAL) 1500 (600 Ca) MG TABS tablet Take 600 mg of elemental calcium by mouth in the morning.     Cholecalciferol (VITAMIN D-3) 125 MCG (5000 UT) TABS Take 5,000 Units by mouth every evening.     Choline Fenofibrate 135 MG capsule Take 135 mg by mouth in the morning.     cilostazol (PLETAL) 100  MG tablet Take 100 mg by mouth 2 (two) times daily.     clopidogrel (PLAVIX) 75 MG tablet Take 75 mg by mouth in the morning.     clotrimazole-betamethasone (LOTRISONE) cream Apply 1 Application topically 2 (two) times daily as needed (skin irritation).     hydrALAZINE (APRESOLINE) 50 MG tablet Take 1 tablet (50 mg total) by mouth 3 (three) times daily. 270 tablet 0   HYDROcodone-acetaminophen (NORCO) 10-325 MG tablet Take 1 tablet by mouth 2 (two) times daily as needed (pain.).     hydrocortisone 2.5 % ointment Apply 1 Application topically 2 (two) times daily as needed (irritation (eyelids)).     hydrOXYzine (ATARAX/VISTARIL) 25 MG tablet Take 25 mg by mouth every 6 (six) hours as needed for itching.     Insulin Glargine (BASAGLAR KWIKPEN) 100 UNIT/ML Inject 90 Units into the skin at bedtime.     isosorbide mononitrate (IMDUR) 60 MG 24 hr tablet Take 60 mg by mouth in the morning.  3   JARDIANCE 25 MG TABS tablet Take 25 mg by mouth in the morning.     lubiprostone (AMITIZA) 8 MCG capsule Take 8 mcg by mouth with breakfast, with lunch, and with evening meal.     Magnesium Oxide 400 MG CAPS Take 800 mg by mouth in the morning.  Multiple Vitamin (MULTIVITAMIN WITH MINERALS) TABS tablet Take 1 tablet by mouth in the morning.     nebivolol (BYSTOLIC) 5 MG tablet Take 1 tablet (5 mg total) by mouth daily. 90 tablet 0   nitroGLYCERIN (NITROSTAT) 0.4 MG SL tablet 0.4 mg every 5 (five) minutes x 3 doses as needed for chest pain.  0   NOVOLOG 100 UNIT/ML injection Inject 35 Units into the skin in the morning, at noon, and at bedtime.  0   nystatin cream (MYCOSTATIN) Apply 1 Application topically 2 (two) times daily as needed (skin irritation).     Olmesartan-amLODIPine-HCTZ 40-5-25 MG TABS Take 1 tablet by mouth daily.      PARoxetine (PAXIL) 30 MG tablet Take 1 tablet (30 mg total) by mouth daily. Bridge to patient appt 11/14 14 tablet 0   Polyethylene Glycol 400 (BLINK TEARS) 0.25 % SOLN Place 1-2  drops into both eyes 3 (three) times daily as needed (dry/irritated eyes).     Potassium 99 MG TABS Take 198 mg by mouth in the morning.     Semaglutide,0.25 or 0.5MG /DOS, 2 MG/1.5ML SOPN Inject 0.5 mg into the skin every Tuesday.     TREMFYA 100 MG/ML SOPN Inject into the skin every 8 (eight) weeks.     zinc sulfate 220 (50 Zn) MG capsule Take 220 mg by mouth at bedtime.     Vitamin D, Ergocalciferol, (DRISDOL) 50000 UNITS CAPS capsule Take 50,000 Units by mouth every Monday. (Patient not taking: Reported on 05/09/2023)     No current facility-administered medications for this visit.     Musculoskeletal: Strength & Muscle Tone: within normal limits Gait & Station:  need cain to help balance Patient leans: Right  Psychiatric Specialty Exam: Review of Systems  Musculoskeletal:  Positive for back pain.    Blood pressure (!) 174/81, pulse 80, height 5\' 2"  (1.575 m), weight (!) 325 lb (147.4 kg).Body mass index is 59.44 kg/m.  General Appearance: Fairly Groomed and overweight  Eye Contact:  Good  Speech:  Pressured  Volume:  Increased  Mood:  Anxious and Dysphoric  Affect:  Full Range  Thought Process:  Goal Directed  Orientation:  Full (Time, Place, and Person)  Thought Content: Rumination   Suicidal Thoughts:  No  Homicidal Thoughts:  No  Memory:  Immediate;   Good  Judgement:  Fair  Insight:  Fair  Psychomotor Activity:  Increased  Concentration:  Concentration: Fair and Attention Span: Fair  Recall:  Fair  Fund of Knowledge: Good  Language: Good  Akathisia:  No  Handed:  Right  AIMS (if indicated): not done  Assets:  Communication Skills Desire for Improvement Housing Social Support Transportation  ADL's:  Intact  Cognition: WNL  Sleep:   ok   Screenings: Flowsheet Row Video Visit [facility] 10/21/2020 [facility]  C-SSRS RISK CATEGORY No Risk        Assessment and Plan: Discussed recent losses and encouraged to consider  therapy grief counseling through hospice.  Provided information.  Patient again reported that she will think about it.  She liked spending time with the 73-year-old grandson even though sometimes very continue Wellbutrin XL 150 mg in the morning and Paxil 40 mg daily.  She had lost the weight since started on exam.  Encouraged to keep appointment with endocrinologist benefits.  Recommend to call us back if she has any question or any concern.  Follow-up in 3 months.  Collaboration of Care: Collaboration of Care: Other provider  involved in patient's care AEB notes are available in epic to review  Patient/Guardian was advised Release of Information must be obtained prior to any record release in order to collaborate their care with an outside provider. Patient/Guardian was advised if they have not already done so to contact the registration department to sign all necessary forms in order for [place] to release information regarding their care.   Consent: Patient/Guardian gives verbal consent for treatment and assignment of benefits for services provided during this visit. Patient/Guardian expressed understanding and agreed to proceed.   I provided 18 minutes face-to-face time during this encounter  Cleotis Nipper, MD 05/09/2023, 10:46 AM

## 2023-07-03 ENCOUNTER — Encounter: Payer: Self-pay | Admitting: Cardiovascular Disease

## 2023-07-03 ENCOUNTER — Ambulatory Visit: Payer: 59 | Attending: Cardiovascular Disease | Admitting: Cardiovascular Disease

## 2023-07-03 VITALS — BP 142/70 | HR 71 | Ht 63.0 in | Wt 326.0 lb

## 2023-07-03 DIAGNOSIS — I2510 Atherosclerotic heart disease of native coronary artery without angina pectoris: Secondary | ICD-10-CM | POA: Diagnosis not present

## 2023-07-03 DIAGNOSIS — R030 Elevated blood-pressure reading, without diagnosis of hypertension: Secondary | ICD-10-CM

## 2023-07-03 DIAGNOSIS — E876 Hypokalemia: Secondary | ICD-10-CM | POA: Diagnosis not present

## 2023-07-03 DIAGNOSIS — E782 Mixed hyperlipidemia: Secondary | ICD-10-CM

## 2023-07-03 DIAGNOSIS — I739 Peripheral vascular disease, unspecified: Secondary | ICD-10-CM | POA: Diagnosis not present

## 2023-07-03 MED ORDER — HYDRALAZINE HCL 50 MG PO TABS: 50.0000 mg | ORAL_TABLET | Freq: Three times a day (TID) | ORAL | 2 refills | Status: DC

## 2023-07-03 MED ORDER — NEBIVOLOL HCL 5 MG PO TABS: 5.0000 mg | ORAL_TABLET | Freq: Every day | ORAL | 3 refills | Status: AC

## 2023-07-03 NOTE — Assessment & Plan Note (Signed)
 History of obesity with a BMI of 58.  She is on a GLP-1 agonist.  She is lost 25 pounds with I saw her a year and a half ago.

## 2023-07-03 NOTE — Patient Instructions (Signed)
Medication Instructions:  Your physician recommends that you continue on your current medications as directed. Please refer to the Current Medication list given to you today.  *If you need a refill on your cardiac medications before your next appointment, please call your pharmacy*   Lab Work: Your physician recommends that you have labs drawn today: Lipid/liver panel  If you have labs (blood work) drawn today and your tests are completely normal, you will receive your results only by: MyChart Message (if you have MyChart) OR A paper copy in the mail If you have any lab test that is abnormal or we need to change your treatment, we will call you to review the results.   Follow-Up: At [facility], you and your health needs are our priority.  As part of our continuing mission to provide you with exceptional heart care, we have created designated Provider Care Teams.  These Care Teams include your primary Cardiologist (physician) and Advanced Practice Providers (APPs -  Physician Assistants and Nurse Practitioners) who all work together to provide you with the care you need, when you need it.  We recommend signing up for the patient portal called "MyChart".  Sign up information is provided on this After Visit Summary.  MyChart is used to connect with patients for Virtual Visits (Telemedicine).  Patients are able to view lab/test results, encounter notes, upcoming appointments, etc.  Non-urgent messages can be sent to your provider as well.   To learn more about what you can do with MyChart, go to [email or web address].    Your next appointment:   12 month(s)  Provider:   Jonathan Berry, MD  

## 2023-07-03 NOTE — Assessment & Plan Note (Signed)
 History of PAD status post right extrailiac artery stenting by myself in 2012.  She really denies claudication.  His most recent Doppler studies performed 11/08/2021 revealed a patent external iliac artery stent.

## 2023-07-03 NOTE — Assessment & Plan Note (Addendum)
 History of hypertension her blood pressure measured today at 142/70.  She is on amlodipine, hydralazine, Bystolic, olmesartan and hydrochlorothiazide.

## 2023-07-03 NOTE — Progress Notes (Signed)
 07/03/2023 [name]   [birth year]  [id number]  [facility] Primary Cardiologist: Dorn JINNY Lesches MD [address] [address], [place], [place]  HPI:  Allison [name] is a 63 y.o.   morbidly overweight married Caucasian female mother of 1, grandmother of 1 grandchild who currently does not work and was referred to me by Dr. Licia, her cardiologist, for peripheral vascular evaluation.  I last saw her in the office 02/05/2022.  She stopped smoking in 2012 at the time of her cardiac catheterization.  She does have treated hypertension, hyperlipidemia and diabetes.  I stented her right extrailiac artery in 2012.  That time her left lower extremity was free of disease.  She currently complains of left lower extremity claudication.  She is also had accelerating angina which is nitrate responsive the last month or 2.  She had COVID back in September.  She also has morbid obesity.  I performed lower extremity arterial Doppler studies on her 11/08/2021 which revealed ABIs of 0.6-0.7 range.  She did have patent extrailiac arteries with monophasic waveforms.  They were not able to interrogate the distal abdominal aorta and common iliac arteries because of her body habitus.  She also has complained of daily chest pain.  We attempted to do a coronary CTA which could not be performed because of tachycardia and PVCs.  Because of her ongoing chest pain I did perform outpatient radial diagnostic cardiac catheterization on her 12/28/2021 revealing patent mid RCA stent with a fairly focal 75% stenosis just beyond the previously placed stents.  I ended up stenting this with a 2.25 mm x 16 mm long Synergy drug-eluting stent postdilated 2.56 mm.  Her angina has completely resolved.  Since I saw her a year and a half ago she has remained stable.  She denies chest pain or shortness of breath.  She has had it been an emotionally trying year for her since she has lost 7 family members.   Current Meds   Medication Sig   acetaminophen  (TYLENOL ) 500 MG tablet Take 500-1,000 mg by mouth every 6 (six) hours as needed (pain).   albuterol  (PROVENTIL  HFA;VENTOLIN  HFA) 108 (90 BASE) MCG/ACT inhaler Inhale 2 puffs into the lungs every 6 (six) hours as needed for wheezing or shortness of breath.   amLODipine  (NORVASC ) 5 MG tablet Take 5 mg by mouth in the morning.   Ascorbic Acid (VITAMIN C) 1000 MG tablet Take 2,000 mg by mouth at bedtime.   aspirin  EC 81 MG tablet Take 81 mg by mouth in the morning.   Biotin w/ Vitamins C & E (HAIR/SKIN/NAILS PO) Take 2 tablets by mouth in the morning.   buPROPion  (WELLBUTRIN  XL) 150 MG 24 hr tablet Take 1 tablet (150 mg total) by mouth daily.   calcium  carbonate (OSCAL) 1500 (600 Ca) MG TABS tablet Take 600 mg of elemental calcium  by mouth in the morning.   Cholecalciferol (VITAMIN D-3) 125 MCG (5000 UT) TABS Take 5,000 Units by mouth every evening.   Choline Fenofibrate 135 MG capsule Take 135 mg by mouth in the morning.   cilostazol  (PLETAL ) 100 MG tablet Take 100 mg by mouth 2 (two) times daily.   clopidogrel  (PLAVIX ) 75 MG tablet Take 75 mg by mouth in the morning.   clotrimazole-betamethasone (LOTRISONE) cream Apply 1 Application topically 2 (two) times daily as needed (skin irritation).   HYDROcodone-acetaminophen  (NORCO) 10-325 MG tablet Take 1 tablet by mouth 2 (two) times daily as needed (pain.).  hydrocortisone 2.5 % ointment Apply 1 Application topically 2 (two) times daily as needed (irritation (eyelids)).   hydrOXYzine (ATARAX/VISTARIL) 25 MG tablet Take 25 mg by mouth every 6 (six) hours as needed for itching.   insulin  glargine (LANTUS) 100 unit/mL SOPN Inject into the skin daily. 65 UNITS DAILY   isosorbide  mononitrate (IMDUR ) 60 MG 24 hr tablet Take 60 mg by mouth in the morning.   JARDIANCE  25 MG TABS tablet Take 25 mg by mouth in the morning.   lubiprostone  (AMITIZA ) 8 MCG capsule Take 8 mcg by mouth with breakfast, with lunch, and with evening  meal.   Magnesium  Oxide 400 MG CAPS Take 800 mg by mouth in the morning.   Multiple Vitamin (MULTIVITAMIN WITH MINERALS) TABS tablet Take 1 tablet by mouth in the morning.   nitroGLYCERIN  (NITROSTAT ) 0.4 MG SL tablet 0.4 mg every 5 (five) minutes x 3 doses as needed for chest pain.   NOVOLOG  100 UNIT/ML injection Inject 35 Units into the skin in the morning, at noon, and at bedtime.   nystatin cream (MYCOSTATIN) Apply 1 Application topically 2 (two) times daily as needed (skin irritation).   Olmesartan -amLODIPine -HCTZ 40-5-25 MG TABS Take 1 tablet by mouth daily.    PARoxetine  (PAXIL ) 30 MG tablet Take 1 tablet (30 mg total) by mouth daily.   Polyethylene Glycol 400 (BLINK TEARS) 0.25 % SOLN Place 1-2 drops into both eyes 3 (three) times daily as needed (dry/irritated eyes).   Potassium 99 MG TABS Take 198 mg by mouth in the morning.   Semaglutide,0.25 or 0.5MG /DOS, 2 MG/1.5ML SOPN Inject 0.5 mg into the skin every Tuesday.   TREMFYA 100 MG/ML SOPN Inject into the skin every 8 (eight) weeks.   Vitamin D, Ergocalciferol, (DRISDOL) 50000 UNITS CAPS capsule Take 50,000 Units by mouth every Monday.   zinc sulfate 220 (50 Zn) MG capsule Take 220 mg by mouth at bedtime.   [DISCONTINUED] hydrALAZINE  (APRESOLINE ) 50 MG tablet Take 1 tablet (50 mg total) by mouth 3 (three) times daily.   [DISCONTINUED] nebivolol  (BYSTOLIC ) 5 MG tablet Take 1 tablet (5 mg total) by mouth daily.     Allergies  Allergen Reactions   Sulfonamide Derivatives Anaphylaxis   Aspirin  Nausea And Vomiting   Metoprolol-Hctz Er Swelling    Eyes swell   Penicillins Hives   Latex Rash    Social History   Socioeconomic History   Marital status: Married    Spouse name: Not on file   Number of children: Not on file   Years of education: Not on file   Highest education level: Not on file  Occupational History   Not on file  Tobacco Use   Smoking status: Former    Current packs/day: 0.00    Types: Cigarettes    Quit  date: 12/23/2009    Years since quitting: 13.5   Smokeless tobacco: Never  Vaping Use   Vaping status: Never Used  Substance and Sexual Activity   Alcohol use: No    Alcohol/week: 0.0 standard drinks of alcohol   Drug use: No   Sexual activity: Not on file  Other Topics Concern   Not on file  Social History Narrative   Not on file   Social Drivers of Health   Financial Resource Strain: Not on file  Food Insecurity: Low Risk  (12/25/2022)   [facility]   Hunger Vital Sign    Worried About Running Out of Food in the Last Year: Never true  Ran Out of Food in the Last Year: Never true  Transportation Needs: No Transportation Needs (12/25/2022)   [facility]   Transportation    In the past 12 months, has lack of reliable transportation kept you from medical appointments, meetings, work or from getting things needed for daily living? : No  Physical Activity: Not on file  Stress: Not on file  Social Connections: Not on file  Intimate Partner Violence: Not on file     Review of Systems: General: negative for chills, fever, night sweats or weight changes.  Cardiovascular: negative for chest pain, dyspnea on exertion, edema, orthopnea, palpitations, paroxysmal nocturnal dyspnea or shortness of breath Dermatological: negative for rash Respiratory: negative for cough or wheezing Urologic: negative for hematuria Abdominal: negative for nausea, vomiting, diarrhea, bright red blood per rectum, melena, or hematemesis Neurologic: negative for visual changes, syncope, or dizziness All other systems reviewed and are otherwise negative except as noted above.    Blood pressure (!) 142/70, pulse 71, height 5' 3 (1.6 m), weight (!) 326 lb (147.9 kg), SpO2 95%.  General appearance: alert and no distress Neck: no adenopathy, no carotid bruit, no JVD, supple, symmetrical, trachea midline, and thyroid not enlarged, symmetric, no  tenderness/mass/nodules Lungs: clear to auscultation bilaterally Heart: regular rate and rhythm, S1, S2 normal, no murmur, click, rub or gallop Extremities: extremities normal, atraumatic, no cyanosis or edema Pulses: 2+ and symmetric Skin: Skin color, texture, turgor normal. No rashes or lesions Neurologic: Grossly normal  EKG EKG Interpretation Date/Time:  Wednesday July 03 2023 11:26:07 EST Ventricular Rate:  71 PR Interval:  176 QRS Duration:  84 QT Interval:  404 QTC Calculation: 439 R Axis:   14  Text Interpretation: Normal sinus rhythm Cannot rule out Anterior infarct , age undetermined When compared with ECG of 29-Dec-2021 03:41, No significant change was found Confirmed by Court Carrier [phone]) on 07/03/2023 11:35:36 AM    ASSESSMENT AND PLAN:   HYPERTENSION, WHITE COAT History of hypertension her blood pressure measured today at 142/70.  She is on amlodipine , hydralazine , Bystolic , olmesartan  and hydrochlorothiazide .  Morbid obesity (HCC) History of obesity with a BMI of 58.  She is on a GLP-1 agonist.  She is lost 25 pounds with I saw her a year and a half ago.  CORONARY ATHEROSCLEROSIS, NATIVE VESSEL History of CAD status post remote cardiac catheterization/RCA stenting in 2012.  Because of chest pain I catheterized her 12/28/2021 revealing a patent mid RCA stent with a fairly focal 75% stenosis just beyond that previously placed stent which I stented with a 2.25 mm x 16 mm long Synergy drug-eluting stent postdilated 2.56 mm.  She has remained angina free since that time on DAPT.  Peripheral arterial disease (HCC) History of PAD status post right extrailiac artery stenting by myself in 2012.  She really denies claudication.  His most recent Doppler studies performed 11/08/2021 revealed a patent external iliac artery stent.     Carrier DOROTHA Court MD FACP,FACC,FAHA, [facility] 07/03/2023 11:47 AM

## 2023-07-03 NOTE — Assessment & Plan Note (Signed)
 History of CAD status post remote cardiac catheterization/RCA stenting in 2012.  Because of chest pain I catheterized her 12/28/2021 revealing a patent mid RCA stent with a fairly focal 75% stenosis just beyond that previously placed stent which I stented with a 2.25 mm x 16 mm long Synergy drug-eluting stent postdilated 2.56 mm.  She has remained angina free since that time on DAPT.

## 2023-07-04 LAB — LIPID PANEL
Chol/HDL Ratio: 3.6 {ratio} (ref 0.0–4.4)
Cholesterol, Total: 197 mg/dL (ref 100–199)
HDL: 55 mg/dL (ref >39)
LDL Chol Calc (NIH): 111 mg/dL — ABNORMAL HIGH (ref 0–99)
Triglycerides: 178 mg/dL — ABNORMAL HIGH (ref 0–149)
VLDL Cholesterol Cal: 31 mg/dL (ref 5–40)

## 2023-07-04 LAB — HEPATIC FUNCTION PANEL
ALT: 18 [IU]/L (ref 0–32)
AST: 20 [IU]/L (ref 0–40)
Albumin: 4.2 g/dL (ref 3.9–4.9)
Alkaline Phosphatase: 66 [IU]/L (ref 44–121)
Bilirubin Total: <0.2 mg/dL (ref 0.0–1.2)
Bilirubin, Direct: <0.08 mg/dL (ref 0.00–0.40)
Total Protein: 6.8 g/dL (ref 6.0–8.5)

## 2023-07-11 ENCOUNTER — Telehealth: Payer: Self-pay | Admitting: *Deleted

## 2023-07-11 DIAGNOSIS — I2510 Atherosclerotic heart disease of native coronary artery without angina pectoris: Secondary | ICD-10-CM

## 2023-07-11 DIAGNOSIS — E782 Mixed hyperlipidemia: Secondary | ICD-10-CM

## 2023-07-11 MED ORDER — ATORVASTATIN CALCIUM 40 MG PO TABS: 40.0000 mg | ORAL_TABLET | Freq: Every day | ORAL | 3 refills | Status: DC

## 2023-07-11 NOTE — Telephone Encounter (Signed)
Spoke with pt, she reports she is taking fenofibrate 135 mg once daily and atorvastatin 20 mg once daily. Medication list updated. She does agree to increase atorvastatin to 40 mg. New script sent to the pharmacy and lab orders mailed to the patient.

## 2023-07-11 NOTE — Telephone Encounter (Signed)
-----   Message from Nanetta Batty sent at 07/04/2023  9:26 AM EST ----- LDL of 111 not on statin therapy.  Has history of CAD.  Start atorvastatin 40 mg a day.  FLP 3 months.  LDL goal less than 70.

## 2023-08-08 ENCOUNTER — Ambulatory Visit (HOSPITAL_BASED_OUTPATIENT_CLINIC_OR_DEPARTMENT_OTHER): Payer: 59 | Admitting: Psychiatry

## 2023-08-08 ENCOUNTER — Encounter (HOSPITAL_COMMUNITY): Payer: Self-pay | Admitting: Psychiatry

## 2023-08-08 ENCOUNTER — Other Ambulatory Visit: Payer: Self-pay

## 2023-08-08 VITALS — BP 157/75 | HR 69 | Ht 62.0 in | Wt 328.0 lb

## 2023-08-08 DIAGNOSIS — F419 Anxiety disorder, unspecified: Secondary | ICD-10-CM

## 2023-08-08 DIAGNOSIS — F330 Major depressive disorder, recurrent, mild: Secondary | ICD-10-CM | POA: Diagnosis not present

## 2023-08-08 DIAGNOSIS — F4321 Adjustment disorder with depressed mood: Secondary | ICD-10-CM | POA: Diagnosis not present

## 2023-08-08 MED ORDER — PAROXETINE HCL 30 MG PO TABS: 30.0000 mg | ORAL_TABLET | Freq: Every day | ORAL | 2 refills | Status: DC

## 2023-08-08 MED ORDER — BUPROPION HCL ER (XL) 150 MG PO TB24: 150.0000 mg | ORAL_TABLET | Freq: Every day | ORAL | 2 refills | Status: DC

## 2023-08-08 NOTE — Progress Notes (Signed)
BH MD/PA/NP OP Progress Note  Patient location; Office Provided location; Office  08/08/2023 10:47 AM Allison [name]  MRN:  [id number]  Chief Complaint:  Chief Complaint  Patient presents with   Follow-up   HPI:  Patient came today for her follow-up appointment.  She is complaining of back pain and neck pain but also very sad because her dog died 3 weeks ago.  Patient had it for 14 years.  She admitted some crying and dysphoria but denies any suicidal thoughts.  She is sleeping good with the help of CPAP.  She had good Christmas and she visited her daughter who live close by.  She has a 57-year-old grand son and sometimes she picks them up from school.  She denies any suicidal thoughts patient denies any irritability, anger, mania or any psychosis.  She lives with her husband.  Recently she has a visit with cardiology and labs are drawn.  Liver enzymes and lipid panel are stable.  She is taking Paxil and Wellbutrin.  She has no tremors, shakes or any EPS.  He denies any irritability or anger but admitted sometimes frustration after losing her dog.  She is not sure if she will get another dog because this is the third dog in one year that she had lost.  She wants to keep the current medication which is helping her mood, anxiety.  Visit Diagnosis:    ICD-10-CM   1. Major depressive disorder, recurrent episode, mild (HCC)  F33.0 buPROPion (WELLBUTRIN XL) 150 MG 24 hr tablet    PARoxetine (PAXIL) 30 MG tablet    2. Anxiety  F41.9 buPROPion (WELLBUTRIN XL) 150 MG 24 hr tablet    PARoxetine (PAXIL) 30 MG tablet    3. Grief  F43.21        Past Psychiatric History: Reviewed H/O mood swing, anger and irritability. No h/o suicidal attempt or inpatient treatment.  Seeing psychiatrist since 2003. Tried Risperdal but stopped after feeling better. We tried Lamictal caused itching, Abilify and Rexulti cannot afford.      Past Medical History:  Past Medical History:  Diagnosis Date   Bipolar  disorder (HCC)    Dysphasia    Hemorrhoids    History of MRSA infection    Hyperlipidemia    Hypokalemia    Hypotension    Major depressive disorder    Migraine headache    Non-insulin dependent diabetes mellitus    Non-ST elevated myocardial infarction (non-STEMI) [facility])     Past Surgical History:  Procedure Laterality Date   CARDIAC CATHETERIZATION  01/03/2010   CORONARY PRESSURE/FFR STUDY N/A 12/28/2021   Procedure: INTRAVASCULAR PRESSURE WIRE/FFR STUDY;  Surgeon: Runell Gess, MD;  Location: [facility];  Service: Cardiovascular;  Laterality: N/A;   CORONARY STENT INTERVENTION N/A 12/28/2021   Procedure: CORONARY STENT INTERVENTION;  Surgeon: Runell Gess, MD;  Location: [facility];  Service: Cardiovascular;  Laterality: N/A;   ENDOMETRIAL ABLATION  2009   Laser   ESOPHAGEAL DILATION  2006   KNEE ARTHROSCOPY  1996   Right   LAPAROTOMY N/A 12/10/2014   Procedure: EXPLORATORY LAPAROTOMY, APPENDECTOMY, SMALL BOWEL RESECTION, WOUND VAC PLACEMENT;  Surgeon: Emelia Loron, MD;  Location: [facility] OR;  Service: General;  Laterality: N/A;   LAPAROTOMY N/A 12/12/2014   Procedure: EXPLORATORY LAPAROTOMY, RENASTAMOSISED SMALL BOWEL;  Surgeon: Axel Filler, MD;  Location: [facility] OR;  Service: General;  Laterality: N/A;   LEFT HEART CATH AND CORONARY ANGIOGRAPHY N/A 12/28/2021   Procedure: LEFT  HEART CATH AND CORONARY ANGIOGRAPHY;  Surgeon: Runell Gess, MD;  Location: [facility] INVASIVE CV LAB;  Service: Cardiovascular;  Laterality: N/A;   VACUUM ASSISTED CLOSURE CHANGE N/A 12/14/2014   Procedure: ABDOMINAL VACUUM CHANGE;  Surgeon: Chevis Pretty III, MD;  Location: [facility] OR;  Service: General;  Laterality: N/A;   VACUUM ASSISTED CLOSURE CHANGE N/A 12/16/2014   Procedure: VACUUM CHANGE  AND WOUND CLOSURE ;  Surgeon: Chevis Pretty III, MD;  Location: [facility] OR;  Service: General;  Laterality: N/A;    Family Psychiatric History: Reviewed  Family History:  Family History  Problem Relation Age  of Onset   Bipolar disorder Sister     Social History:  Social History   Socioeconomic History   Marital status: Married    Spouse name: Not on file   Number of children: Not on file   Years of education: Not on file   Highest education level: Not on file  Occupational History   Not on file  Tobacco Use   Smoking status: Former    Current packs/day: 0.00    Types: Cigarettes    Quit date: 12/23/2009    Years since quitting: 13.6   Smokeless tobacco: Never  Vaping Use   Vaping status: Never Used  Substance and Sexual Activity   Alcohol use: No    Alcohol/week: 0.0 standard drinks of alcohol   Drug use: No   Sexual activity: Not on file  Other Topics Concern   Not on file  Social History Narrative   Not on file   Social Drivers of Health   Financial Resource Strain: Not on file  Food Insecurity: Low Risk  (12/25/2022)   [facility]   Hunger Vital Sign    Worried About Running Out of Food in the Last Year: Never true    Ran Out of Food in the Last Year: Never true  Transportation Needs: No Transportation Needs (12/25/2022)   [facility]   Transportation    In the past 12 months, has lack of reliable transportation kept you from medical appointments, meetings, work or from getting things needed for daily living? : No  Physical Activity: Not on file  Stress: Not on file  Social Connections: Not on file    Allergies:  Allergies  Allergen Reactions   Sulfonamide Derivatives Anaphylaxis   Aspirin Nausea And Vomiting   Metoprolol-Hctz Er Swelling    Eyes swell   Penicillins Hives   Latex Rash    Metabolic Disorder Labs: Lab Results  Component Value Date   HGBA1C 6.0 (H) 12/07/2014   MPG 126 12/07/2014   MPG 289 (H) 01/02/2010   No results found for: "PROLACTIN" Lab Results  Component Value Date   CHOL 197 07/03/2023   TRIG 178 (H) 07/03/2023   HDL 55 07/03/2023   CHOLHDL 3.6 07/03/2023   VLDL 68  (H) 01/02/2010   LDLCALC 111 (H) 07/03/2023   LDLCALC (H) 01/02/2010    153        Total Cholesterol/HDL:CHD Risk Coronary Heart Disease Risk Table                     Men   Women  1/2 Average Risk   3.4   3.3  Average Risk       5.0   4.4  2 X Average Risk   9.6   7.1  3 X Average Risk  23.4   11.0  Use the calculated Patient Ratio above and the CHD Risk Table to determine the patient's CHD Risk.        ATP III CLASSIFICATION (LDL):  <100     mg/dL   Optimal  161-096  mg/dL   Near or Above                    Optimal  130-159  mg/dL   Borderline  045-409  mg/dL   High  >811     mg/dL   Very High   Lab Results  Component Value Date   TSH 0.848 01/02/2010   TSH 0.848 01/02/2010    Therapeutic Level Labs: No results found for: "LITHIUM" No results found for: "VALPROATE" No results found for: "CBMZ"  Current Medications: Current Outpatient Medications  Medication Sig Dispense Refill   acetaminophen (TYLENOL) 500 MG tablet Take 500-1,000 mg by mouth every 6 (six) hours as needed (pain).     albuterol (PROVENTIL HFA;VENTOLIN HFA) 108 (90 BASE) MCG/ACT inhaler Inhale 2 puffs into the lungs every 6 (six) hours as needed for wheezing or shortness of breath.     amLODipine (NORVASC) 5 MG tablet Take 5 mg by mouth in the morning.     Ascorbic Acid (VITAMIN C) 1000 MG tablet Take 2,000 mg by mouth at bedtime.     aspirin EC 81 MG tablet Take 81 mg by mouth in the morning.     atorvastatin (LIPITOR) 40 MG tablet Take 1 tablet (40 mg total) by mouth daily. 90 tablet 3   Biotin w/ Vitamins C & E (HAIR/SKIN/NAILS PO) Take 2 tablets by mouth in the morning.     buPROPion (WELLBUTRIN XL) 150 MG 24 hr tablet Take 1 tablet (150 mg total) by mouth daily. 30 tablet 2   calcium carbonate (OSCAL) 1500 (600 Ca) MG TABS tablet Take 600 mg of elemental calcium by mouth in the morning.     Cholecalciferol (VITAMIN D-3) 125 MCG (5000 UT) TABS Take 5,000 Units by mouth every evening.      Choline Fenofibrate 135 MG capsule Take 135 mg by mouth in the morning.     cilostazol (PLETAL) 100 MG tablet Take 100 mg by mouth 2 (two) times daily.     clopidogrel (PLAVIX) 75 MG tablet Take 75 mg by mouth in the morning.     clotrimazole-betamethasone (LOTRISONE) cream Apply 1 Application topically 2 (two) times daily as needed (skin irritation).     fenofibrate micronized (LOFIBRA) 134 MG capsule Take 1 capsule (134 mg total) by mouth daily before breakfast.     hydrALAZINE (APRESOLINE) 50 MG tablet Take 1 tablet (50 mg total) by mouth 3 (three) times daily. 270 tablet 2   HYDROcodone-acetaminophen (NORCO) 10-325 MG tablet Take 1 tablet by mouth 2 (two) times daily as needed (pain.).     hydrocortisone 2.5 % ointment Apply 1 Application topically 2 (two) times daily as needed (irritation (eyelids)).     hydrOXYzine (ATARAX/VISTARIL) 25 MG tablet Take 25 mg by mouth every 6 (six) hours as needed for itching.     Insulin Glargine (BASAGLAR KWIKPEN) 100 UNIT/ML Inject 90 Units into the skin at bedtime. (Patient not taking: Reported on 07/03/2023)     insulin glargine (LANTUS) 100 unit/mL SOPN Inject into the skin daily. 65 UNITS DAILY     isosorbide mononitrate (IMDUR) 60 MG 24 hr tablet Take 60 mg by mouth in the morning.  3   JARDIANCE 25 MG TABS tablet Take 25 mg  by mouth in the morning.     lubiprostone (AMITIZA) 8 MCG capsule Take 8 mcg by mouth with breakfast, with lunch, and with evening meal.     Magnesium Oxide 400 MG CAPS Take 800 mg by mouth in the morning.     Multiple Vitamin (MULTIVITAMIN WITH MINERALS) TABS tablet Take 1 tablet by mouth in the morning.     nebivolol (BYSTOLIC) 5 MG tablet Take 1 tablet (5 mg total) by mouth daily. 90 tablet 3   nitroGLYCERIN (NITROSTAT) 0.4 MG SL tablet 0.4 mg every 5 (five) minutes x 3 doses as needed for chest pain.  0   NOVOLOG 100 UNIT/ML injection Inject 35 Units into the skin in the morning, at noon, and at bedtime.  0   nystatin cream  (MYCOSTATIN) Apply 1 Application topically 2 (two) times daily as needed (skin irritation).     Olmesartan-amLODIPine-HCTZ 40-5-25 MG TABS Take 1 tablet by mouth daily.      PARoxetine (PAXIL) 30 MG tablet Take 1 tablet (30 mg total) by mouth daily. 30 tablet 2   Polyethylene Glycol 400 (BLINK TEARS) 0.25 % SOLN Place 1-2 drops into both eyes 3 (three) times daily as needed (dry/irritated eyes).     Potassium 99 MG TABS Take 198 mg by mouth in the morning.     Semaglutide,0.25 or 0.5MG /DOS, 2 MG/1.5ML SOPN Inject 0.5 mg into the skin every Tuesday.     TREMFYA 100 MG/ML SOPN Inject into the skin every 8 (eight) weeks.     Vitamin D, Ergocalciferol, (DRISDOL) 50000 UNITS CAPS capsule Take 50,000 Units by mouth every Monday.     zinc sulfate 220 (50 Zn) MG capsule Take 220 mg by mouth at bedtime.     No current facility-administered medications for this visit.     Musculoskeletal: Strength & Muscle Tone: within normal limits Gait & Station:  need cain to help balance Patient leans: Right Psychiatric Specialty Exam: Physical Exam  Review of Systems  Constitutional:  Positive for fatigue.  Musculoskeletal:  Positive for back pain and gait problem.       Neck pain  Psychiatric/Behavioral:  Positive for dysphoric mood.     Blood pressure (!) 157/75, pulse 69, height 5\' 2"  (1.575 m), weight (!) 328 lb (148.8 kg).There is no height or weight on file to calculate BMI.  General Appearance: Fairly Groomed and overweight  Eye Contact:  Fair  Speech:  Slow  Volume:  Decreased  Mood:  Dysphoric  Affect:  Constricted  Thought Process:  Descriptions of Associations: Intact  Orientation:  Full (Time, Place, and Person)  Thought Content:  Rumination  Suicidal Thoughts:  No  Homicidal Thoughts:  No  Memory:  Immediate;   Good Recent;   Fair Remote;   Fair  Judgement:  Intact  Insight:  Present  Psychomotor Activity:  Decreased  Concentration:  Concentration: Fair and Attention Span: Fair   Recall:  Good  Fund of Knowledge:  Good  Language:  Good  Akathisia:  No  Handed:  Right  AIMS (if indicated):     Assets:  Communication Skills Desire for Improvement Housing Transportation  ADL's:  Intact  Cognition:  WNL  Sleep:   good with CPAP     Screenings: PHQ2-9    Flowsheet Row [facility] Visit [facility]/08/2023 in [facility]  PHQ-2 Total Score 0      Flowsheet Row Video Visit from 10/21/2020 in [facility]  C-SSRS RISK CATEGORY No  Risk        Assessment and Plan:  I reviewed blood work results.  Labs are stable.  Discussed going through grief after losing her dog which she had for 14-1/2 years.  I have offered grief counseling but patient does not feel she needed.  She believe it is situational sadness and crying and hopefully she will get over with it.  She may consider having another dog in the future.  She has good support from her husband.  Patient is very close to her 54-year-old grandchild and she had a support from her daughter who lives close by.  She like to keep the current medicine.  She has no major concern.  Continue Wellbutrin XL 150 mg in the morning and Paxil 40 mg daily.  Recommended to call us back if she is any question or any concern.  Patient has appointment coming up to see the endocrinologist for hemoglobin A1c.  She is on Ozempic.  She hoping the next blood work had better results. Recommend to call us back if she has any question or any concern.  Follow-up in 3 months.  Collaboration of Care: Collaboration of Care: Other provider involved in patient's care AEB notes are available in epic to review  Patient/Guardian was advised Release of Information must be obtained prior to any record release in order to collaborate their care with an outside provider. Patient/Guardian was advised if they have not already done so to contact the registration department to sign all  necessary forms in order for [place] to release information regarding their care.   Consent: Patient/Guardian gives verbal consent for treatment and assignment of benefits for services provided during this visit. Patient/Guardian expressed understanding and agreed to proceed.   I provided 23 minutes face-to-face time during this encounter  Cleotis Nipper, MD 08/08/2023, 10:47 AM

## 2023-09-11 ENCOUNTER — Other Ambulatory Visit (HOSPITAL_COMMUNITY): Payer: Self-pay | Admitting: *Deleted

## 2023-09-12 ENCOUNTER — Other Ambulatory Visit (HOSPITAL_COMMUNITY): Payer: Self-pay | Admitting: *Deleted

## 2023-09-12 NOTE — Telephone Encounter (Signed)
 Opened in error

## 2023-10-20 ENCOUNTER — Other Ambulatory Visit (HOSPITAL_COMMUNITY): Payer: Self-pay | Admitting: Psychiatry

## 2023-10-20 DIAGNOSIS — F330 Major depressive disorder, recurrent, mild: Secondary | ICD-10-CM

## 2023-10-20 DIAGNOSIS — F419 Anxiety disorder, unspecified: Secondary | ICD-10-CM

## 2023-10-21 ENCOUNTER — Other Ambulatory Visit (HOSPITAL_COMMUNITY): Payer: Self-pay

## 2023-10-21 DIAGNOSIS — F419 Anxiety disorder, unspecified: Secondary | ICD-10-CM

## 2023-10-21 DIAGNOSIS — F330 Major depressive disorder, recurrent, mild: Secondary | ICD-10-CM

## 2023-10-21 MED ORDER — BUPROPION HCL ER (XL) 150 MG PO TB24: 150.0000 mg | ORAL_TABLET | Freq: Every day | ORAL | 0 refills | Status: DC

## 2023-11-07 ENCOUNTER — Ambulatory Visit (HOSPITAL_BASED_OUTPATIENT_CLINIC_OR_DEPARTMENT_OTHER): Payer: 59 | Admitting: Psychiatry

## 2023-11-07 ENCOUNTER — Other Ambulatory Visit: Payer: Self-pay

## 2023-11-07 ENCOUNTER — Encounter (HOSPITAL_COMMUNITY): Payer: Self-pay | Admitting: Psychiatry

## 2023-11-07 DIAGNOSIS — F419 Anxiety disorder, unspecified: Secondary | ICD-10-CM

## 2023-11-07 DIAGNOSIS — F330 Major depressive disorder, recurrent, mild: Secondary | ICD-10-CM | POA: Diagnosis not present

## 2023-11-07 MED ORDER — PAROXETINE HCL 30 MG PO TABS: 30.0000 mg | ORAL_TABLET | Freq: Every day | ORAL | 2 refills | Status: DC

## 2023-11-07 MED ORDER — BUPROPION HCL ER (XL) 150 MG PO TB24: 150.0000 mg | ORAL_TABLET | Freq: Every day | ORAL | 0 refills | Status: DC

## 2023-11-07 NOTE — Progress Notes (Signed)
 Allison [name]

## 2023-11-07 NOTE — Progress Notes (Signed)
 [facility] MD/PA/NP OP Progress Note  Patient location; Office Provided location; Office  11/07/2023 10:45 AM Allison [name]  MRN:  [id number]  Chief Complaint:  Chief Complaint  Patient presents with   Follow-up   Medication Refill   HPI: Patient came today for her follow-up appointment.  She is doing better since she had 2 puppies in February.  She spent time with them and she feels very good about it.  She is sleeping better with the help of CPAP.  She denies any crying spells, feeling of hopelessness or worthlessness.  She has a good Mother's Day but she was sleeping most of the time because of chronic pain.  Patient told lately her pain has been worse and she has to take multiple times Tylenol  and she is also prescribed hydrocodone from [facility].  She is using cane to help her balance.  Recently she had a blood work and her hemoglobin A1c stable on 6.  She is on Ozempic but has not seen significant weight loss but her blood sugar is much better.  Patient denies any panic attack, hopelessness, suicidal thoughts.  She is taking Wellbutrin  and Paxil  in combination working well for her.  She is not in therapy and she feel her best therapy is her 2 puppies.  Patient denies drinking or using any illegal substances.   Visit Diagnosis:    ICD-10-CM   1. Major depressive disorder, recurrent episode, mild (HCC)  F33.0 buPROPion  (WELLBUTRIN  XL) 150 MG 24 hr tablet    PARoxetine  (PAXIL ) 30 MG tablet    2. Anxiety  F41.9 buPROPion  (WELLBUTRIN  XL) 150 MG 24 hr tablet    PARoxetine  (PAXIL ) 30 MG tablet        Past Psychiatric History: Reviewed H/O mood swing, anger and irritability. No h/o suicidal attempt or inpatient treatment.  Seeing psychiatrist since 2003. Tried Risperdal  but stopped after feeling better. We tried Lamictal  caused itching, Abilify  and Rexulti  cannot afford.      Past Medical History:  Past Medical History:  Diagnosis Date   Bipolar disorder (HCC)    Dysphasia     Hemorrhoids    History of MRSA infection    Hyperlipidemia    Hypokalemia    Hypotension    Major depressive disorder    Migraine headache    Non-insulin  dependent diabetes mellitus    Non-ST elevated myocardial infarction (non-STEMI) [facility])     Past Surgical History:  Procedure Laterality Date   CARDIAC CATHETERIZATION  01/03/2010   CORONARY PRESSURE/FFR STUDY N/A 12/28/2021   Procedure: INTRAVASCULAR PRESSURE WIRE/FFR STUDY;  Surgeon: Avanell Leigh, MD;  Location: [facility];  Service: Cardiovascular;  Laterality: N/A;   CORONARY STENT INTERVENTION N/A 12/28/2021   Procedure: CORONARY STENT INTERVENTION;  Surgeon: Avanell Leigh, MD;  Location: [facility];  Service: Cardiovascular;  Laterality: N/A;   ENDOMETRIAL ABLATION  2009   Laser   ESOPHAGEAL DILATION  2006   KNEE ARTHROSCOPY  1996   Right   LAPAROTOMY N/A 12/10/2014   Procedure: EXPLORATORY LAPAROTOMY, APPENDECTOMY, SMALL BOWEL RESECTION, WOUND VAC PLACEMENT;  Surgeon: Enid Harry, MD;  Location: [facility] OR;  Service: General;  Laterality: N/A;   LAPAROTOMY N/A 12/12/2014   Procedure: EXPLORATORY LAPAROTOMY, RENASTAMOSISED SMALL BOWEL;  Surgeon: Shela Derby, MD;  Location: [facility] OR;  Service: General;  Laterality: N/A;   LEFT HEART CATH AND CORONARY ANGIOGRAPHY N/A 12/28/2021   Procedure: LEFT HEART CATH AND CORONARY ANGIOGRAPHY;  Surgeon: Avanell Leigh, MD;  Location: [facility];  Service: Cardiovascular;  Laterality: N/A;   VACUUM ASSISTED CLOSURE CHANGE N/A 12/14/2014   Procedure: ABDOMINAL VACUUM CHANGE;  Surgeon: Lillette Reid III, MD;  Location: [facility] OR;  Service: General;  Laterality: N/A;   VACUUM ASSISTED CLOSURE CHANGE N/A 12/16/2014   Procedure: VACUUM CHANGE  AND WOUND CLOSURE ;  Surgeon: Lillette Reid III, MD;  Location: [facility] OR;  Service: General;  Laterality: N/A;    Family Psychiatric History: Reviewed  Family History:  Family History  Problem Relation Age of Onset   Bipolar disorder  Sister     Social History:  Social History   Socioeconomic History   Marital status: Married    Spouse name: Not on file   Number of children: Not on file   Years of education: Not on file   Highest education level: Not on file  Occupational History   Not on file  Tobacco Use   Smoking status: Former    Current packs/day: 0.00    Types: Cigarettes    Quit date: 12/23/2009    Years since quitting: 13.8   Smokeless tobacco: Never  Vaping Use   Vaping status: Never Used  Substance and Sexual Activity   Alcohol use: No    Alcohol/week: 0.0 standard drinks of alcohol   Drug use: No   Sexual activity: Not on file  Other Topics Concern   Not on file  Social History Narrative   Not on file   Social Drivers of Health   Financial Resource Strain: Not on file  Food Insecurity: Low Risk  (12/25/2022)   [facility]   Hunger Vital Sign    Worried About Running Out of Food in the Last Year: Never true    Ran Out of Food in the Last Year: Never true  Transportation Needs: No Transportation Needs (12/25/2022)   [facility]   Transportation    In the past 12 months, has lack of reliable transportation kept you from medical appointments, meetings, work or from getting things needed for daily living? : No  Physical Activity: Not on file  Stress: Not on file  Social Connections: Not on file    Allergies:  Allergies  Allergen Reactions   Sulfonamide Derivatives Anaphylaxis   Aspirin  Nausea And Vomiting   Metoprolol-Hctz Er Swelling    Eyes swell   Penicillins Hives   Latex Rash    Metabolic Disorder Labs: Lab Results  Component Value Date   HGBA1C 6.0 (H) 12/07/2014   MPG 126 12/07/2014   MPG 289 (H) 01/02/2010   No results found for: "PROLACTIN" Lab Results  Component Value Date   CHOL 197 07/03/2023   TRIG 178 (H) 07/03/2023   HDL 55 07/03/2023   CHOLHDL 3.6 07/03/2023   VLDL 68 (H) 01/02/2010   LDLCALC  111 (H) 07/03/2023   LDLCALC (H) 01/02/2010    153        Total Cholesterol/HDL:CHD Risk Coronary Heart Disease Risk Table                     Men   Women  1/2 Average Risk   3.4   3.3  Average Risk       5.0   4.4  2 X Average Risk   9.6   7.1  3 X Average Risk  23.4   11.0        Use the calculated Patient Ratio above and  the CHD Risk Table to determine the patient's CHD Risk.        ATP III CLASSIFICATION (LDL):  <100     mg/dL   Optimal  161-096  mg/dL   Near or Above                    Optimal  130-159  mg/dL   Borderline  045-409  mg/dL   High  >811     mg/dL   Very High   Lab Results  Component Value Date   TSH 0.848 01/02/2010   TSH 0.848 01/02/2010    Therapeutic Level Labs: No results found for: "LITHIUM" No results found for: "VALPROATE" No results found for: "CBMZ"  Current Medications: Current Outpatient Medications  Medication Sig Dispense Refill   acetaminophen  (TYLENOL ) 500 MG tablet Take 500-1,000 mg by mouth every 6 (six) hours as needed (pain).     albuterol  (PROVENTIL  HFA;VENTOLIN  HFA) 108 (90 BASE) MCG/ACT inhaler Inhale 2 puffs into the lungs every 6 (six) hours as needed for wheezing or shortness of breath.     amLODipine  (NORVASC ) 5 MG tablet Take 5 mg by mouth in the morning.     Ascorbic Acid (VITAMIN C) 1000 MG tablet Take 2,000 mg by mouth at bedtime.     aspirin  EC 81 MG tablet Take 81 mg by mouth in the morning.     atorvastatin  (LIPITOR) 40 MG tablet Take 1 tablet (40 mg total) by mouth daily. 90 tablet 3   Biotin w/ Vitamins C & E (HAIR/SKIN/NAILS PO) Take 2 tablets by mouth in the morning.     buPROPion  (WELLBUTRIN  XL) 150 MG 24 hr tablet Take 1 tablet (150 mg total) by mouth daily. 30 tablet 0   calcium  carbonate (OSCAL) 1500 (600 Ca) MG TABS tablet Take 600 mg of elemental calcium  by mouth in the morning.     Cholecalciferol (VITAMIN D-3) 125 MCG (5000 UT) TABS Take 5,000 Units by mouth every evening.     Choline Fenofibrate 135 MG  capsule Take 135 mg by mouth in the morning.     cilostazol  (PLETAL ) 100 MG tablet Take 100 mg by mouth 2 (two) times daily.     clopidogrel  (PLAVIX ) 75 MG tablet Take 75 mg by mouth in the morning.     clotrimazole-betamethasone (LOTRISONE) cream Apply 1 Application topically 2 (two) times daily as needed (skin irritation).     fenofibrate micronized (LOFIBRA) 134 MG capsule Take 1 capsule (134 mg total) by mouth daily before breakfast.     hydrALAZINE  (APRESOLINE ) 50 MG tablet Take 1 tablet (50 mg total) by mouth 3 (three) times daily. 270 tablet 2   HYDROcodone-acetaminophen  (NORCO) 10-325 MG tablet Take 1 tablet by mouth 2 (two) times daily as needed (pain.).     hydrocortisone 2.5 % ointment Apply 1 Application topically 2 (two) times daily as needed (irritation (eyelids)).     hydrOXYzine (ATARAX/VISTARIL) 25 MG tablet Take 25 mg by mouth every 6 (six) hours as needed for itching.     Insulin  Glargine (BASAGLAR KWIKPEN) 100 UNIT/ML Inject 90 Units into the skin at bedtime. (Patient not taking: Reported on 07/03/2023)     insulin  glargine (LANTUS) 100 unit/mL SOPN Inject into the skin daily. 65 UNITS DAILY     isosorbide  mononitrate (IMDUR ) 60 MG 24 hr tablet Take 60 mg by mouth in the morning.  3   JARDIANCE  25 MG TABS tablet Take 25 mg by mouth in the morning.  lubiprostone  (AMITIZA ) 8 MCG capsule Take 8 mcg by mouth with breakfast, with lunch, and with evening meal.     Magnesium  Oxide 400 MG CAPS Take 800 mg by mouth in the morning.     Multiple Vitamin (MULTIVITAMIN WITH MINERALS) TABS tablet Take 1 tablet by mouth in the morning.     nebivolol  (BYSTOLIC ) 5 MG tablet Take 1 tablet (5 mg total) by mouth daily. 90 tablet 3   nitroGLYCERIN  (NITROSTAT ) 0.4 MG SL tablet 0.4 mg every 5 (five) minutes x 3 doses as needed for chest pain.  0   NOVOLOG  100 UNIT/ML injection Inject 35 Units into the skin in the morning, at noon, and at bedtime.  0   nystatin cream (MYCOSTATIN) Apply 1 Application  topically 2 (two) times daily as needed (skin irritation).     Olmesartan -amLODIPine -HCTZ 40-5-25 MG TABS Take 1 tablet by mouth daily.      PARoxetine  (PAXIL ) 30 MG tablet Take 1 tablet (30 mg total) by mouth daily. 30 tablet 2   Polyethylene Glycol 400 (BLINK TEARS) 0.25 % SOLN Place 1-2 drops into both eyes 3 (three) times daily as needed (dry/irritated eyes).     Potassium 99 MG TABS Take 198 mg by mouth in the morning.     Semaglutide,0.25 or 0.5MG /DOS, 2 MG/1.5ML SOPN Inject 0.5 mg into the skin every Tuesday.     TREMFYA 100 MG/ML SOPN Inject into the skin every 8 (eight) weeks.     Vitamin D, Ergocalciferol, (DRISDOL) 50000 UNITS CAPS capsule Take 50,000 Units by mouth every Monday.     zinc sulfate 220 (50 Zn) MG capsule Take 220 mg by mouth at bedtime.     No current facility-administered medications for this visit.     Musculoskeletal: Strength & Muscle Tone: within normal limits Gait & Station: need cain to help balance Patient leans: Right Psychiatric Specialty Exam: Physical Exam  Review of Systems  Constitutional:  Positive for fatigue.  Musculoskeletal:  Positive for back pain and gait problem.       Neck pain    Blood pressure (!) 175/71, pulse 79, height 5\' 2"  (1.575 m), weight (!) 336 lb (152.4 kg).Body mass index is 61.46 kg/m.  General Appearance: Fairly Groomed and overweight  Eye Contact:  Good  Speech:  Normal Rate and Slow  Volume:  Normal  Mood:  Euthymic  Affect:  Constricted  Thought Process:  Descriptions of Associations: Intact  Orientation:  Full (Time, Place, and Person)  Thought Content:  WDL  Suicidal Thoughts:  No  Homicidal Thoughts:  No  Memory:  Immediate;   Good Recent;   Fair Remote;   Fair  Judgement:  Intact  Insight:  Present  Psychomotor Activity:  Decreased  Concentration:  Concentration: Fair and Attention Span: Fair  Recall:  Good  Fund of Knowledge:  Good  Language:  Good  Akathisia:  No  Handed:  Right  AIMS (if  indicated):     Assets:  Communication Skills Desire for Improvement Housing Transportation  ADL's:  Intact  Cognition:  WNL  Sleep:   good with CPAP     Screenings: PHQ2-9    Flowsheet Row [facility] Visit [facility]/08/2023 in [facility]  PHQ-2 Total Score 0      Flowsheet Row Video Visit from 10/21/2020 in [facility]  C-SSRS RISK CATEGORY No Risk        Assessment and Plan:  Patient is 63 year old female with history of coronary  atherosclerosis, peripheral arterial disease, type 2 diabetes, morbid obesity, chronic pain and taking narcotics.  She reported her depression anxiety is under control since she had 2 puppies.  I reviewed blood work results.  Hemoglobin A1c is 6 which is better than before.  She is on Ozempic.  She is seeing pain management at [facility] and prescribed hydrocodone.  Patient reported living situation is good.  She spent time with her 2-year-old grandchild.  Her daughter live close by.  Patient lives with her husband.  Discussed medication side effects and benefits.  Recommend to call us  back if she has any question or any concern.  Follow-up in 3 months.  Collaboration of Care: Collaboration of Care: Other provider involved in patient's care AEB notes are available in epic to review  Patient/Guardian was advised Release of Information must be obtained prior to any record release in order to collaborate their care with an outside provider. Patient/Guardian was advised if they have not already done so to contact the registration department to sign all necessary forms in order for us  to release information regarding their care.   Consent: Patient/Guardian gives verbal consent for treatment and assignment of benefits for services provided during this visit. Patient/Guardian expressed understanding and agreed to proceed.   I provided 18 minutes face-to-face time during this  encounter  Arturo Late, MD 11/07/2023, 10:45 AM

## 2023-11-20 ENCOUNTER — Telehealth: Payer: Self-pay | Admitting: Cardiovascular Disease

## 2023-11-20 DIAGNOSIS — E782 Mixed hyperlipidemia: Secondary | ICD-10-CM

## 2023-11-20 NOTE — Telephone Encounter (Signed)
 Pt c/o medication issue:  1. Name of Medication:   atorvastatin  (LIPITOR) 40 MG tablet (Expired)   2. How are you currently taking this medication (dosage and times per day)?   As prescribed  3. Are you having a reaction (difficulty breathing--STAT)?   Leg spasms, muscle cramps  4. What is your medication issue?   Patient stated this medication is making her have leg spasm and muscle cramps and she can hardly walk.  Patient stated she stopped taking this medication and has been feeling pretty good.  Patient wants advice on next steps.

## 2023-11-20 NOTE — Telephone Encounter (Signed)
 Spoke to patient she stated she has been having severe muscle pain and cramps.She stopped taking Atorvastatin  and she feels much better no pain and no cramps.She wanted to know if Dr.Berry recommended another medication for her cholesterol.I will send message to [facility] for advice.

## 2023-11-20 NOTE — Telephone Encounter (Signed)
 Can she reduce the dose of the atorvastatin  to 20 mg daily and add Zetia  10 mg daily and recheck lipids under Dr. Arlester Ladd name in 2 to 3 months.  She can stop atorvastatin  for the next 2 weeks as a statin holiday.  Please advise her to check her vitamin D levels with her PCP if she has not already done so in the past.  If she is unable to I will want to try reduced dose, we could try Crestor 20 mg daily along with Zetia  10 mg daily.

## 2023-11-21 MED ORDER — EZETIMIBE 10 MG PO TABS: 10.0000 mg | ORAL_TABLET | Freq: Every day | ORAL | 3 refills | Status: AC

## 2023-11-21 NOTE — Telephone Encounter (Signed)
 Spoke to patient Dr.Ganji's advice given.She will hold Atorvastatin  for 2 weeks.Then start 20 mg daily and Zetia  10 mg daily.Repeat fasting lipid and hepatic panels in 3 months.Lab orders mailed.

## 2023-11-21 NOTE — Telephone Encounter (Signed)
 She will have PCP check her Vit D level.

## 2024-02-13 ENCOUNTER — Telehealth (HOSPITAL_BASED_OUTPATIENT_CLINIC_OR_DEPARTMENT_OTHER): Admitting: Psychiatry

## 2024-02-13 ENCOUNTER — Encounter (HOSPITAL_COMMUNITY): Payer: Self-pay | Admitting: Psychiatry

## 2024-02-13 VITALS — Wt 336.0 lb

## 2024-02-13 DIAGNOSIS — F419 Anxiety disorder, unspecified: Secondary | ICD-10-CM | POA: Diagnosis not present

## 2024-02-13 DIAGNOSIS — F330 Major depressive disorder, recurrent, mild: Secondary | ICD-10-CM

## 2024-02-13 MED ORDER — BUPROPION HCL ER (XL) 150 MG PO TB24: 150.0000 mg | ORAL_TABLET | Freq: Every day | ORAL | 2 refills | Status: DC

## 2024-02-13 MED ORDER — PAROXETINE HCL 30 MG PO TABS: 30.0000 mg | ORAL_TABLET | Freq: Every day | ORAL | 2 refills | Status: DC

## 2024-02-13 NOTE — Progress Notes (Signed)
 [facility] MD Virtual Progress Note   Patient Location: Home Provider Location: Office  I connect with patient by telephone and verified that I am speaking with correct person by using two identifiers. I discussed the limitations of evaluation and management by telemedicine and the availability of in person appointments. I also discussed with the patient that there may be a patient responsible charge related to this service. The patient expressed understanding and agreed to proceed.  Allison [name] [id number] 63 y.o.  02/13/2024 10:21 AM  History of Present Illness:  Patient is evaluated by phone session.  She had cold symptoms and could not come to the office.  Patient told her husband is positive for COVID and she is going to have test for her cell later this afternoon.  She reported things are going okay.  She is taking Paxil  and Wellbutrin  which is keeping her mood stable.  She denies any major panic attack.  She denies any irritability, hopelessness, worthlessness.  She denies any suicidal thoughts.  She is using CPAP is helping her sleep.  She is also taking pain medicine for her chronic pain.  She is on Ozempic and she was told her last hemoglobin A1c was stable.  She has not able to lose a lot of weight but her blood sugar is stable.  Patient told her daughter is pregnant and expected date in February 2026.  Patient told this will be her second grandchild and she is very happy and excited about it but also concern as daughter is 86 years old and she takes medicine for blood pressure.  Patient told it could be a high risk pregnancy but she is praying and hoping all goes well.  She does not want to change the medication.  She denies drinking or using any illegal substances.  Past Psychiatric History: H/O mood swing, anger and irritability. No h/o suicidal attempt or inpatient treatment.  Seeing psychiatrist since 2003. Tried Risperdal  but stopped after feeling better. We tried  Lamictal  caused itching, Abilify  and Rexulti  cannot afford.    Past Medical History:  Diagnosis Date   Bipolar disorder (HCC)    Dysphasia    Hemorrhoids    History of MRSA infection    Hyperlipidemia    Hypokalemia    Hypotension    Major depressive disorder    Migraine headache    Non-insulin  dependent diabetes mellitus    Non-ST elevated myocardial infarction (non-STEMI) [facility])     Outpatient Encounter Medications as of 02/13/2024  Medication Sig   acetaminophen  (TYLENOL ) 500 MG tablet Take 500-1,000 mg by mouth every 6 (six) hours as needed (pain).   albuterol  (PROVENTIL  HFA;VENTOLIN  HFA) 108 (90 BASE) MCG/ACT inhaler Inhale 2 puffs into the lungs every 6 (six) hours as needed for wheezing or shortness of breath.   amLODipine  (NORVASC ) 5 MG tablet Take 5 mg by mouth in the morning.   Ascorbic Acid (VITAMIN C) 1000 MG tablet Take 2,000 mg by mouth at bedtime.   aspirin  EC 81 MG tablet Take 81 mg by mouth in the morning.   atorvastatin  (LIPITOR) 40 MG tablet Take 1/2 tablet ( 20 mg ) daily   Biotin w/ Vitamins C & E (HAIR/SKIN/NAILS PO) Take 2 tablets by mouth in the morning.   buPROPion  (WELLBUTRIN  XL) 150 MG 24 hr tablet Take 1 tablet (150 mg total) by mouth daily.   calcium  carbonate (OSCAL) 1500 (600 Ca) MG TABS tablet Take 600 mg of elemental calcium  by mouth in the morning.  Cholecalciferol (VITAMIN D-3) 125 MCG (5000 UT) TABS Take 5,000 Units by mouth every evening.   Choline Fenofibrate 135 MG capsule Take 135 mg by mouth in the morning.   cilostazol  (PLETAL ) 100 MG tablet Take 100 mg by mouth 2 (two) times daily.   clopidogrel  (PLAVIX ) 75 MG tablet Take 75 mg by mouth in the morning.   clotrimazole-betamethasone (LOTRISONE) cream Apply 1 Application topically 2 (two) times daily as needed (skin irritation).   ezetimibe  (ZETIA ) 10 MG tablet Take 1 tablet (10 mg total) by mouth daily.   fenofibrate micronized (LOFIBRA) 134 MG capsule Take 1 capsule (134 mg total) by mouth  daily before breakfast.   hydrALAZINE  (APRESOLINE ) 50 MG tablet Take 1 tablet (50 mg total) by mouth 3 (three) times daily.   HYDROcodone-acetaminophen  (NORCO) 10-325 MG tablet Take 1 tablet by mouth 2 (two) times daily as needed (pain.).   hydrocortisone 2.5 % ointment Apply 1 Application topically 2 (two) times daily as needed (irritation (eyelids)).   hydrOXYzine (ATARAX/VISTARIL) 25 MG tablet Take 25 mg by mouth every 6 (six) hours as needed for itching.   Insulin  Glargine (BASAGLAR KWIKPEN) 100 UNIT/ML Inject 90 Units into the skin at bedtime. (Patient not taking: Reported on 07/03/2023)   insulin  glargine (LANTUS) 100 unit/mL SOPN Inject into the skin daily. 65 UNITS DAILY   isosorbide  mononitrate (IMDUR ) 60 MG 24 hr tablet Take 60 mg by mouth in the morning.   JARDIANCE  25 MG TABS tablet Take 25 mg by mouth in the morning.   lubiprostone  (AMITIZA ) 8 MCG capsule Take 8 mcg by mouth with breakfast, with lunch, and with evening meal.   Magnesium  Oxide 400 MG CAPS Take 800 mg by mouth in the morning.   Multiple Vitamin (MULTIVITAMIN WITH MINERALS) TABS tablet Take 1 tablet by mouth in the morning.   nebivolol  (BYSTOLIC ) 5 MG tablet Take 1 tablet (5 mg total) by mouth daily.   nitroGLYCERIN  (NITROSTAT ) 0.4 MG SL tablet 0.4 mg every 5 (five) minutes x 3 doses as needed for chest pain.   NOVOLOG  100 UNIT/ML injection Inject 35 Units into the skin in the morning, at noon, and at bedtime.   nystatin cream (MYCOSTATIN) Apply 1 Application topically 2 (two) times daily as needed (skin irritation).   Olmesartan -amLODIPine -HCTZ 40-5-25 MG TABS Take 1 tablet by mouth daily.    PARoxetine  (PAXIL ) 30 MG tablet Take 1 tablet (30 mg total) by mouth daily.   Polyethylene Glycol 400 (BLINK TEARS) 0.25 % SOLN Place 1-2 drops into both eyes 3 (three) times daily as needed (dry/irritated eyes).   Potassium 99 MG TABS Take 198 mg by mouth in the morning.   Semaglutide,0.25 or 0.5MG /DOS, 2 MG/1.5ML SOPN Inject 0.5  mg into the skin every Tuesday.   TREMFYA 100 MG/ML SOPN Inject into the skin every 8 (eight) weeks.   Vitamin D, Ergocalciferol, (DRISDOL) 50000 UNITS CAPS capsule Take 50,000 Units by mouth every Monday.   zinc sulfate 220 (50 Zn) MG capsule Take 220 mg by mouth at bedtime.   No facility-administered encounter medications on file as of 02/13/2024.    No results found for this or any previous visit (from the past 2160 hours).   Psychiatric Specialty Exam: Physical Exam  Review of Systems  Constitutional:  Positive for fatigue.  Respiratory:  Positive for cough and stridor.   Musculoskeletal:  Positive for back pain.    Weight (!) 336 lb (152.4 kg).There is no height or weight on file to calculate BMI.  General Appearance: NA  Eye Contact:  NA  Speech:  Slow  Volume:  Decreased  Mood:  tired  Affect:  NA  Thought Process:  Goal Directed  Orientation:  Full (Time, Place, and Person)  Thought Content:  Logical  Suicidal Thoughts:  No  Homicidal Thoughts:  No  Memory:  Immediate;   Good Recent;   Fair Remote;   Fair  Judgement:  Intact  Insight:  Present  Psychomotor Activity:  Normal  Concentration:  Concentration: Fair and Attention Span: Fair  Recall:  [employer] of Knowledge:  Good  Language:  Good  Akathisia:  No  Handed:  Right  AIMS (if indicated):     Assets:  Communication Skills Desire for Improvement Housing Transportation  ADL's:  Intact  Cognition:  WNL  Sleep:  fair, using CPAP       08/08/2023   11:02 AM  Depression screen PHQ 2/9  Decreased Interest 0  Down, Depressed, Hopeless 0  PHQ - 2 Score 0    Assessment/Plan: Major depressive disorder, recurrent episode, mild (HCC) - Plan: buPROPion  (WELLBUTRIN  XL) 150 MG 24 hr tablet, PARoxetine  (PAXIL ) 30 MG tablet  Anxiety - Plan: buPROPion  (WELLBUTRIN  XL) 150 MG 24 hr tablet, PARoxetine  (PAXIL ) 30 MG tablet  Patient is 63 year old female with history of coronary artery disease, peripheral artery  disease, type 2 diabetes, morbid obesity, chronic pain and currently on narcotic pain medicine.  She had cold symptoms and going to get tested for COVID this afternoon.  She reported her depression and anxiety is stable and wants to continue current medication of Wellbutrin  XL 150 mg daily and Paxil  30 mg daily.  She has no tremor or shakes or any EPS.  She is very attached to her 51-year-old granddaughter and she is happy that her daughter is pregnant and expected in February 2026.  Discussed medication side effects and benefits.  Recommend to call back if she is any question or any concern.  Follow-up in 3 months.   Follow Up Instructions:     I discussed the assessment and treatment plan with the patient. The patient was provided an opportunity to ask questions and all were answered. The patient agreed with the plan and demonstrated an understanding of the instructions.   The patient was advised to call back or seek an in-person evaluation if the symptoms worsen or if the condition fails to improve as anticipated.    Collaboration of Care: Other provider involved in patient's care AEB notes are available in epic to review  Patient/Guardian was advised Release of Information must be obtained prior to any record release in order to collaborate their care with an outside provider. Patient/Guardian was advised if they have not already done so to contact the registration department to sign all necessary forms in order for us  to release information regarding their care.   Consent: Patient/Guardian gives verbal consent for treatment and assignment of benefits for services provided during this visit. Patient/Guardian expressed understanding and agreed to proceed.     Total encounter time 18 minutes which includes face-to-face time, chart reviewed, care coordination, order entry and documentation during this encounter.   Note: This document was prepared by [employer] voice dictation technology and any  errors that results from this process are unintentional.    Leni ONEIDA Client, MD 02/13/2024

## 2024-05-13 ENCOUNTER — Other Ambulatory Visit: Payer: Self-pay | Admitting: Cardiovascular Disease

## 2024-05-18 ENCOUNTER — Telehealth (HOSPITAL_COMMUNITY): Admitting: Psychiatry

## 2024-05-18 ENCOUNTER — Encounter (HOSPITAL_COMMUNITY): Payer: Self-pay | Admitting: Psychiatry

## 2024-05-18 VITALS — Wt 335.0 lb

## 2024-05-18 DIAGNOSIS — F330 Major depressive disorder, recurrent, mild: Secondary | ICD-10-CM

## 2024-05-18 DIAGNOSIS — F419 Anxiety disorder, unspecified: Secondary | ICD-10-CM

## 2024-05-18 MED ORDER — BUPROPION HCL ER (XL) 150 MG PO TB24
150.0000 mg | ORAL_TABLET | Freq: Every day | ORAL | 2 refills | Status: AC
Start: 2024-05-18 — End: ?

## 2024-05-18 MED ORDER — PAROXETINE HCL 30 MG PO TABS: 30.0000 mg | ORAL_TABLET | Freq: Every day | ORAL | 2 refills | Status: AC

## 2024-05-18 NOTE — Progress Notes (Signed)
 [facility] MD Virtual Progress Note   Patient Location: Home Provider Location: Home Office  I connect with patient by telephone and verified that I am speaking with correct person by using two identifiers. I discussed the limitations of evaluation and management by telemedicine and the availability of in person appointments. I also discussed with the patient that there may be a patient responsible charge related to this service. The patient expressed understanding and agreed to proceed.  Allison [name] [id number] 63 y.o.  05/18/2024 1:40 PM  History of Present Illness:  Patient is evaluated by phone session.  She is having congestion, shortness of breath, headaches and may have a COVID symptoms.  She is not sure but going to contact the primary care.  She could not do video session.  She is taking her psychiatric medication which is helping her depression, anxiety and she is not getting irritable angry or frustrated.  She denies any suicidal thoughts or homicidal thoughts.  She is using CPAP and she noticed sleeping too much because she has no energy and motivation to do things.  She is also very frustrated on her weight.  Despite taking Ozempic she had not lost weight.  However her hemoglobin A1c is stable.  She is tolerating Wellbutrin  and Paxil .  She has no tremor or shakes or any EPS.  She excited as daughter is pregnant and expected in January/February.  Patient lives with her animals but her daughter checks are her on a daily basis.  Patient denies drinking or using any illegal substances.  She like to keep the current medicine and promised to have a follow-up visit in person in the future.  Past Psychiatric History: H/O mood swing, anger and irritability. No h/o suicidal attempt or inpatient treatment.  Seeing psychiatrist since 2003. Tried Risperdal  but stopped after feeling better. We tried Lamictal  caused itching, Abilify  and Rexulti  cannot afford.    Past Medical  History:  Diagnosis Date   Bipolar disorder (HCC)    Dysphasia    Hemorrhoids    History of MRSA infection    Hyperlipidemia    Hypokalemia    Hypotension    Major depressive disorder    Migraine headache    Non-insulin  dependent diabetes mellitus    Non-ST elevated myocardial infarction (non-STEMI) [facility])     Outpatient Encounter Medications as of 05/18/2024  Medication Sig   acetaminophen  (TYLENOL ) 500 MG tablet Take 500-1,000 mg by mouth every 6 (six) hours as needed (pain).   albuterol  (PROVENTIL  HFA;VENTOLIN  HFA) 108 (90 BASE) MCG/ACT inhaler Inhale 2 puffs into the lungs every 6 (six) hours as needed for wheezing or shortness of breath.   amLODipine  (NORVASC ) 5 MG tablet Take 5 mg by mouth in the morning.   Ascorbic Acid (VITAMIN C) 1000 MG tablet Take 2,000 mg by mouth at bedtime.   aspirin  EC 81 MG tablet Take 81 mg by mouth in the morning.   atorvastatin  (LIPITOR) 40 MG tablet Take 1/2 tablet ( 20 mg ) daily   Biotin w/ Vitamins C & E (HAIR/SKIN/NAILS PO) Take 2 tablets by mouth in the morning.   buPROPion  (WELLBUTRIN  XL) 150 MG 24 hr tablet Take 1 tablet (150 mg total) by mouth daily.   calcium  carbonate (OSCAL) 1500 (600 Ca) MG TABS tablet Take 600 mg of elemental calcium  by mouth in the morning.   Cholecalciferol (VITAMIN D-3) 125 MCG (5000 UT) TABS Take 5,000 Units by mouth every evening.   Choline Fenofibrate 135 MG capsule Take  135 mg by mouth in the morning.   cilostazol  (PLETAL ) 100 MG tablet Take 100 mg by mouth 2 (two) times daily.   clopidogrel  (PLAVIX ) 75 MG tablet Take 75 mg by mouth in the morning.   clotrimazole-betamethasone (LOTRISONE) cream Apply 1 Application topically 2 (two) times daily as needed (skin irritation).   ezetimibe  (ZETIA ) 10 MG tablet Take 1 tablet (10 mg total) by mouth daily.   fenofibrate micronized (LOFIBRA) 134 MG capsule Take 1 capsule (134 mg total) by mouth daily before breakfast.   hydrALAZINE  (APRESOLINE ) 50 MG tablet TAKE 1 TABLET  BY MOUTH THREE TIMES DAILY   HYDROcodone-acetaminophen  (NORCO) 10-325 MG tablet Take 1 tablet by mouth 2 (two) times daily as needed (pain.).   hydrocortisone 2.5 % ointment Apply 1 Application topically 2 (two) times daily as needed (irritation (eyelids)).   hydrOXYzine (ATARAX/VISTARIL) 25 MG tablet Take 25 mg by mouth every 6 (six) hours as needed for itching.   Insulin  Glargine (BASAGLAR KWIKPEN) 100 UNIT/ML Inject 90 Units into the skin at bedtime. (Patient not taking: Reported on 07/03/2023)   insulin  glargine (LANTUS) 100 unit/mL SOPN Inject into the skin daily. 65 UNITS DAILY   isosorbide  mononitrate (IMDUR ) 60 MG 24 hr tablet Take 60 mg by mouth in the morning.   JARDIANCE  25 MG TABS tablet Take 25 mg by mouth in the morning.   lubiprostone  (AMITIZA ) 8 MCG capsule Take 8 mcg by mouth with breakfast, with lunch, and with evening meal.   Magnesium  Oxide 400 MG CAPS Take 800 mg by mouth in the morning.   Multiple Vitamin (MULTIVITAMIN WITH MINERALS) TABS tablet Take 1 tablet by mouth in the morning.   nebivolol  (BYSTOLIC ) 5 MG tablet Take 1 tablet (5 mg total) by mouth daily.   nitroGLYCERIN  (NITROSTAT ) 0.4 MG SL tablet 0.4 mg every 5 (five) minutes x 3 doses as needed for chest pain.   NOVOLOG  100 UNIT/ML injection Inject 35 Units into the skin in the morning, at noon, and at bedtime.   nystatin cream (MYCOSTATIN) Apply 1 Application topically 2 (two) times daily as needed (skin irritation).   Olmesartan -amLODIPine -HCTZ 40-5-25 MG TABS Take 1 tablet by mouth daily.    PARoxetine  (PAXIL ) 30 MG tablet Take 1 tablet (30 mg total) by mouth daily.   Polyethylene Glycol 400 (BLINK TEARS) 0.25 % SOLN Place 1-2 drops into both eyes 3 (three) times daily as needed (dry/irritated eyes).   Potassium 99 MG TABS Take 198 mg by mouth in the morning.   Semaglutide,0.25 or 0.5MG /DOS, 2 MG/1.5ML SOPN Inject 0.5 mg into the skin every Tuesday.   TREMFYA 100 MG/ML SOPN Inject into the skin every 8 (eight)  weeks.   Vitamin D, Ergocalciferol, (DRISDOL) 50000 UNITS CAPS capsule Take 50,000 Units by mouth every Monday.   zinc sulfate 220 (50 Zn) MG capsule Take 220 mg by mouth at bedtime.   No facility-administered encounter medications on file as of 05/18/2024.    No results found for this or any previous visit (from the past 2160 hours).   Psychiatric Specialty Exam: Physical Exam  Review of Systems  Constitutional:  Positive for fatigue.  Respiratory:  Positive for shortness of breath.     Weight (!) 335 lb (152 kg).There is no height or weight on file to calculate BMI.  General Appearance: NA  Eye Contact:  NA  Speech:  Slow  Volume:  Decreased  Mood:  Anxious and tired  Affect:  NA  Thought Process:  Descriptions of Associations: Intact  Orientation:  Full (Time, Place, and Person)  Thought Content:  Rumination  Suicidal Thoughts:  No  Homicidal Thoughts:  No  Memory:  Immediate;   Fair Recent;   Fair Remote;   Fair  Judgement:  Fair  Insight:  Fair  Psychomotor Activity:  Decreased  Concentration:  Concentration: Fair and Attention Span: Fair  Recall:  [employer] of Knowledge:  Fair  Language:  Good  Akathisia:  No  Handed:  Right  AIMS (if indicated):     Assets:  Communication Skills Desire for Improvement Housing Social Support Transportation  ADL's:  Intact  Cognition:  WNL  Sleep:  too much,using CPAP       08/08/2023   11:02 AM  Depression screen PHQ 2/9  Decreased Interest 0  Down, Depressed, Hopeless 0  PHQ - 2 Score 0    Assessment/Plan: Major depressive disorder, recurrent episode, mild - Plan: buPROPion  (WELLBUTRIN  XL) 150 MG 24 hr tablet, PARoxetine  (PAXIL ) 30 MG tablet  Anxiety - Plan: buPROPion  (WELLBUTRIN  XL) 150 MG 24 hr tablet, PARoxetine  (PAXIL ) 30 MG tablet  Patient is 63 year old female with history of coronary artery disease, peripheral vascular disease, diabetes, obesity, chronic pain and currently on narcotic pain medicine, major  depressive disorder and anxiety.  Discussed physical symptoms and recommend to see primary care as patient not feeling well.  She is having cough and believe may have COVID.  Patient agreed to give call to PCP this afternoon.  She does not want to change the medication since it is working well.  She also promised to have future visit in person.  Will continue Wellbutrin  XL 150 mg daily and Paxil  30 mg daily.  She has no tremor or shakes or any EPS.  Encouraged to call back if she has any question or concern or if she feels worsening of symptoms.  Follow-up in 3 months   Follow Up Instructions:     I discussed the assessment and treatment plan with the patient. The patient was provided an opportunity to ask questions and all were answered. The patient agreed with the plan and demonstrated an understanding of the instructions.   The patient was advised to call back or seek an in-person evaluation if the symptoms worsen or if the condition fails to improve as anticipated.    Collaboration of Care: Other provider involved in patient's care AEB notes are available in epic to review  Patient/Guardian was advised Release of Information must be obtained prior to any record release in order to collaborate their care with an outside provider. Patient/Guardian was advised if they have not already done so to contact the registration department to sign all necessary forms in order for us  to release information regarding their care.   Consent: Patient/Guardian gives verbal consent for treatment and assignment of benefits for services provided during this visit. Patient/Guardian expressed understanding and agreed to proceed.     Total encounter time 19 minutes which includes face-to-face time, chart reviewed, care coordination, order entry and documentation during this encounter.   Note: This document was prepared by [employer] voice dictation technology and any errors that results from this process are  unintentional.    Leni ONEIDA Client, MD 05/18/2024

## 2024-08-17 ENCOUNTER — Telehealth (HOSPITAL_COMMUNITY): Admitting: Psychiatry
